# Patient Record
Sex: Male | Born: 1980 | Race: White | Hispanic: No | Marital: Married | Smoking: Former smoker
Health system: Southern US, Community
[De-identification: ages and names within clinical notes are randomized; demographics above are authoritative.]

## PROBLEM LIST (undated history)

## (undated) DIAGNOSIS — F909 Attention-deficit hyperactivity disorder, unspecified type: Secondary | ICD-10-CM

## (undated) DIAGNOSIS — K219 Gastro-esophageal reflux disease without esophagitis: Secondary | ICD-10-CM

## (undated) DIAGNOSIS — F819 Developmental disorder of scholastic skills, unspecified: Secondary | ICD-10-CM

## (undated) DIAGNOSIS — E785 Hyperlipidemia, unspecified: Secondary | ICD-10-CM

## (undated) DIAGNOSIS — C6290 Malignant neoplasm of unspecified testis, unspecified whether descended or undescended: Secondary | ICD-10-CM

## (undated) DIAGNOSIS — A630 Anogenital (venereal) warts: Secondary | ICD-10-CM

## (undated) DIAGNOSIS — I10 Essential (primary) hypertension: Secondary | ICD-10-CM

## (undated) HISTORY — DX: Attention-deficit hyperactivity disorder, unspecified type: F90.9

## (undated) HISTORY — DX: Malignant neoplasm of unspecified testis, unspecified whether descended or undescended: C62.90

## (undated) HISTORY — DX: Anogenital (venereal) warts: A63.0

## (undated) HISTORY — PX: VASECTOMY: SHX75

## (undated) HISTORY — DX: Essential (primary) hypertension: I10

## (undated) HISTORY — DX: Hyperlipidemia, unspecified: E78.5

## (undated) HISTORY — DX: Developmental disorder of scholastic skills, unspecified: F81.9

## (undated) HISTORY — DX: Gastro-esophageal reflux disease without esophagitis: K21.9

---
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
## 2002-10-27 ENCOUNTER — Encounter: Payer: Self-pay | Admitting: Emergency Medicine

## 2002-10-27 ENCOUNTER — Emergency Department (HOSPITAL_COMMUNITY): Admission: EM | Admit: 2002-10-27 | Discharge: 2002-10-27 | Payer: Self-pay | Admitting: Emergency Medicine

## 2004-08-07 ENCOUNTER — Ambulatory Visit: Payer: Self-pay | Admitting: Family Medicine

## 2005-05-11 ENCOUNTER — Ambulatory Visit: Payer: Self-pay | Admitting: Family Medicine

## 2005-05-22 ENCOUNTER — Ambulatory Visit: Payer: Self-pay | Admitting: Family Medicine

## 2005-10-23 ENCOUNTER — Emergency Department (HOSPITAL_COMMUNITY): Admission: EM | Admit: 2005-10-23 | Discharge: 2005-10-23 | Payer: Self-pay | Admitting: Family Medicine

## 2006-05-07 ENCOUNTER — Ambulatory Visit: Payer: Self-pay | Admitting: Family Medicine

## 2006-05-24 ENCOUNTER — Ambulatory Visit: Payer: Self-pay | Admitting: Family Medicine

## 2006-09-12 ENCOUNTER — Ambulatory Visit: Payer: Self-pay | Admitting: Family Medicine

## 2006-09-12 LAB — CONVERTED CEMR LAB
ALT: 29 units/L (ref 0–40)
AST: 25 units/L (ref 0–37)
Chol/HDL Ratio, serum: 4.7
Cholesterol: 208 mg/dL (ref 0–200)
HDL: 44.4 mg/dL (ref >39.0)
LDL DIRECT: 141.5 mg/dL
Triglyceride fasting, serum: 249 mg/dL (ref 0–149)
VLDL: 50 mg/dL — ABNORMAL HIGH (ref 0–40)

## 2007-02-17 ENCOUNTER — Ambulatory Visit: Payer: Self-pay | Admitting: Family Medicine

## 2007-05-15 DIAGNOSIS — E785 Hyperlipidemia, unspecified: Secondary | ICD-10-CM

## 2007-05-15 DIAGNOSIS — F8189 Other developmental disorders of scholastic skills: Secondary | ICD-10-CM

## 2007-05-15 DIAGNOSIS — E782 Mixed hyperlipidemia: Secondary | ICD-10-CM | POA: Insufficient documentation

## 2007-05-20 ENCOUNTER — Telehealth: Payer: Self-pay | Admitting: Family Medicine

## 2007-06-02 ENCOUNTER — Telehealth: Payer: Self-pay | Admitting: Family Medicine

## 2007-06-04 ENCOUNTER — Ambulatory Visit: Payer: Self-pay | Admitting: Family Medicine

## 2007-06-04 LAB — CONVERTED CEMR LAB
Bilirubin Urine: NEGATIVE
Blood in Urine, dipstick: NEGATIVE
Glucose, Urine, Semiquant: NEGATIVE
Ketones, urine, test strip: NEGATIVE
Nitrite: NEGATIVE
Protein, U semiquant: NEGATIVE
Specific Gravity, Urine: 1.02
Urobilinogen, UA: 0.2
WBC Urine, dipstick: NEGATIVE
pH: 7

## 2007-06-05 LAB — CONVERTED CEMR LAB
ALT: 39 units/L (ref 0–53)
AST: 29 units/L (ref 0–37)
Albumin: 4.2 g/dL (ref 3.5–5.2)
Alkaline Phosphatase: 39 units/L (ref 39–117)
BUN: 9 mg/dL (ref 6–23)
Basophils Absolute: 0 10*3/uL (ref 0.0–0.1)
Basophils Relative: 0.6 % (ref 0.0–1.0)
Bilirubin, Direct: 0.1 mg/dL (ref 0.0–0.3)
CO2: 30 meq/L (ref 19–32)
Calcium: 9.4 mg/dL (ref 8.4–10.5)
Chloride: 108 meq/L (ref 96–112)
Cholesterol: 216 mg/dL (ref 0–200)
Creatinine, Ser: 1.1 mg/dL (ref 0.4–1.5)
Direct LDL: 136.5 mg/dL
Eosinophils Absolute: 0.2 10*3/uL (ref 0.0–0.6)
Eosinophils Relative: 3.1 % (ref 0.0–5.0)
GFR calc Af Amer: 103 mL/min
GFR calc non Af Amer: 85 mL/min
Glucose, Bld: 84 mg/dL (ref 70–99)
HCT: 44 % (ref 39.0–52.0)
HDL: 38.4 mg/dL — ABNORMAL LOW (ref >39.0)
Hemoglobin: 15.5 g/dL (ref 13.0–17.0)
Lymphocytes Relative: 35.6 % (ref 12.0–46.0)
MCHC: 35.3 g/dL (ref 30.0–36.0)
MCV: 91.2 fL (ref 78.0–100.0)
Monocytes Absolute: 0.8 10*3/uL — ABNORMAL HIGH (ref 0.2–0.7)
Monocytes Relative: 10.5 % (ref 3.0–11.0)
Neutro Abs: 3.8 10*3/uL (ref 1.4–7.7)
Neutrophils Relative %: 50.2 % (ref 43.0–77.0)
Platelets: 327 10*3/uL (ref 150–400)
Potassium: 4.9 meq/L (ref 3.5–5.1)
RBC: 4.82 M/uL (ref 4.22–5.81)
RDW: 12.9 % (ref 11.5–14.6)
Sodium: 144 meq/L (ref 135–145)
TSH: 2.79 microintl units/mL (ref 0.35–5.50)
Total Bilirubin: 0.7 mg/dL (ref 0.3–1.2)
Total CHOL/HDL Ratio: 5.6
Total Protein: 6.7 g/dL (ref 6.0–8.3)
Triglycerides: 227 mg/dL (ref 0–149)
VLDL: 45 mg/dL — ABNORMAL HIGH (ref 0–40)
WBC: 7.5 10*3/uL (ref 4.5–10.5)

## 2007-06-18 ENCOUNTER — Ambulatory Visit: Payer: Self-pay | Admitting: Family Medicine

## 2007-06-18 DIAGNOSIS — Z8547 Personal history of malignant neoplasm of testis: Secondary | ICD-10-CM

## 2007-08-18 ENCOUNTER — Telehealth: Payer: Self-pay | Admitting: Family Medicine

## 2007-11-18 ENCOUNTER — Telehealth: Payer: Self-pay | Admitting: Family Medicine

## 2008-02-18 ENCOUNTER — Telehealth: Payer: Self-pay | Admitting: Family Medicine

## 2008-05-17 ENCOUNTER — Telehealth: Payer: Self-pay | Admitting: Family Medicine

## 2008-06-25 ENCOUNTER — Telehealth: Payer: Self-pay | Admitting: Internal Medicine

## 2008-07-06 ENCOUNTER — Ambulatory Visit: Payer: Self-pay | Admitting: Family Medicine

## 2008-07-06 LAB — CONVERTED CEMR LAB
Bilirubin Urine: NEGATIVE
Blood in Urine, dipstick: NEGATIVE
Glucose, Urine, Semiquant: NEGATIVE
Ketones, urine, test strip: NEGATIVE
Nitrite: NEGATIVE
Protein, U semiquant: NEGATIVE
Specific Gravity, Urine: 1.025
Urobilinogen, UA: 0.2
WBC Urine, dipstick: NEGATIVE
pH: 5

## 2008-07-08 LAB — CONVERTED CEMR LAB
ALT: 34 units/L (ref 0–53)
AST: 27 units/L (ref 0–37)
Albumin: 4.3 g/dL (ref 3.5–5.2)
Alkaline Phosphatase: 38 units/L — ABNORMAL LOW (ref 39–117)
BUN: 13 mg/dL (ref 6–23)
Basophils Absolute: 0 10*3/uL (ref 0.0–0.1)
Basophils Relative: 0.5 % (ref 0.0–3.0)
Bilirubin, Direct: 0.1 mg/dL (ref 0.0–0.3)
CO2: 28 meq/L (ref 19–32)
Calcium: 9.4 mg/dL (ref 8.4–10.5)
Chloride: 103 meq/L (ref 96–112)
Cholesterol: 252 mg/dL (ref 0–200)
Creatinine, Ser: 1.1 mg/dL (ref 0.4–1.5)
Direct LDL: 110.4 mg/dL
Eosinophils Absolute: 0.2 10*3/uL (ref 0.0–0.7)
Eosinophils Relative: 3 % (ref 0.0–5.0)
GFR calc Af Amer: 102 mL/min
GFR calc non Af Amer: 84 mL/min
Glucose, Bld: 89 mg/dL (ref 70–99)
HCT: 43.6 % (ref 39.0–52.0)
HDL: 33.5 mg/dL — ABNORMAL LOW (ref >39.0)
Hemoglobin: 15.5 g/dL (ref 13.0–17.0)
Lymphocytes Relative: 36.9 % (ref 12.0–46.0)
MCHC: 35.6 g/dL (ref 30.0–36.0)
MCV: 92.5 fL (ref 78.0–100.0)
Monocytes Absolute: 0.7 10*3/uL (ref 0.1–1.0)
Monocytes Relative: 10.2 % (ref 3.0–12.0)
Neutro Abs: 3.6 10*3/uL (ref 1.4–7.7)
Neutrophils Relative %: 49.4 % (ref 43.0–77.0)
Platelets: 283 10*3/uL (ref 150–400)
Potassium: 4 meq/L (ref 3.5–5.1)
RBC: 4.72 M/uL (ref 4.22–5.81)
RDW: 13.1 % (ref 11.5–14.6)
Sodium: 140 meq/L (ref 135–145)
TSH: 4.92 microintl units/mL (ref 0.35–5.50)
Total Bilirubin: 0.8 mg/dL (ref 0.3–1.2)
Total CHOL/HDL Ratio: 7.5
Total Protein: 7.2 g/dL (ref 6.0–8.3)
Triglycerides: 681 mg/dL (ref 0–149)
VLDL: 136 mg/dL — ABNORMAL HIGH (ref 0–40)
WBC: 7.1 10*3/uL (ref 4.5–10.5)

## 2008-07-09 ENCOUNTER — Telehealth: Payer: Self-pay | Admitting: Family Medicine

## 2008-07-19 ENCOUNTER — Telehealth: Payer: Self-pay | Admitting: Family Medicine

## 2008-07-27 ENCOUNTER — Ambulatory Visit: Payer: Self-pay | Admitting: Family Medicine

## 2008-07-27 DIAGNOSIS — R61 Generalized hyperhidrosis: Secondary | ICD-10-CM | POA: Insufficient documentation

## 2008-09-22 ENCOUNTER — Encounter: Payer: Self-pay | Admitting: Family Medicine

## 2008-11-09 ENCOUNTER — Telehealth: Payer: Self-pay | Admitting: Family Medicine

## 2009-02-07 ENCOUNTER — Telehealth: Payer: Self-pay | Admitting: Family Medicine

## 2009-05-06 ENCOUNTER — Telehealth: Payer: Self-pay | Admitting: Family Medicine

## 2009-07-25 ENCOUNTER — Ambulatory Visit: Payer: Self-pay | Admitting: Family Medicine

## 2009-07-25 LAB — CONVERTED CEMR LAB
Bilirubin Urine: NEGATIVE
Blood in Urine, dipstick: NEGATIVE
Glucose, Urine, Semiquant: NEGATIVE
Ketones, urine, test strip: NEGATIVE
Nitrite: NEGATIVE
Protein, U semiquant: NEGATIVE
Specific Gravity, Urine: 1.02
Urobilinogen, UA: 0.2
WBC Urine, dipstick: NEGATIVE
pH: 7

## 2009-07-27 LAB — CONVERTED CEMR LAB
ALT: 37 units/L (ref 0–53)
AST: 26 units/L (ref 0–37)
Albumin: 4.4 g/dL (ref 3.5–5.2)
Alkaline Phosphatase: 32 units/L — ABNORMAL LOW (ref 39–117)
BUN: 10 mg/dL (ref 6–23)
Basophils Relative: 0.7 % (ref 0.0–3.0)
Bilirubin, Direct: 0.1 mg/dL (ref 0.0–0.3)
CO2: 28 meq/L (ref 19–32)
Calcium: 9.3 mg/dL (ref 8.4–10.5)
Chloride: 104 meq/L (ref 96–112)
Cholesterol: 227 mg/dL — ABNORMAL HIGH (ref 0–200)
Creatinine, Ser: 1 mg/dL (ref 0.4–1.5)
Direct LDL: 160.2 mg/dL
Eosinophils Relative: 2.6 % (ref 0.0–5.0)
GFR calc non Af Amer: 92.78 mL/min (ref >60)
Glucose, Bld: 90 mg/dL (ref 70–99)
HCT: 40.5 % (ref 39.0–52.0)
HDL: 40.2 mg/dL (ref >39.00)
Hemoglobin: 14.2 g/dL (ref 13.0–17.0)
Lymphocytes Relative: 30.4 % (ref 12.0–46.0)
MCHC: 35.2 g/dL (ref 30.0–36.0)
MCV: 96.2 fL (ref 78.0–100.0)
Monocytes Relative: 8.8 % (ref 3.0–12.0)
Neutrophils Relative %: 57.5 % (ref 43.0–77.0)
Platelets: 236 10*3/uL (ref 150.0–400.0)
Potassium: 4.4 meq/L (ref 3.5–5.1)
RBC: 4.21 M/uL — ABNORMAL LOW (ref 4.22–5.81)
RDW: 12.9 % (ref 11.5–14.6)
Sodium: 143 meq/L (ref 135–145)
TSH: 3.46 microintl units/mL (ref 0.35–5.50)
Total Bilirubin: 0.8 mg/dL (ref 0.3–1.2)
Total CHOL/HDL Ratio: 6
Total Protein: 7.2 g/dL (ref 6.0–8.3)
Triglycerides: 256 mg/dL — ABNORMAL HIGH (ref 0.0–149.0)
VLDL: 51.2 mg/dL — ABNORMAL HIGH (ref 0.0–40.0)
WBC: 8.2 10*3/uL (ref 4.5–10.5)

## 2009-08-01 ENCOUNTER — Ambulatory Visit: Payer: Self-pay | Admitting: Family Medicine

## 2009-11-01 ENCOUNTER — Ambulatory Visit: Payer: Self-pay | Admitting: Family Medicine

## 2009-11-02 ENCOUNTER — Telehealth: Payer: Self-pay | Admitting: Family Medicine

## 2009-11-02 LAB — CONVERTED CEMR LAB
ALT: 32 units/L (ref 0–53)
AST: 24 units/L (ref 0–37)
Albumin: 4.4 g/dL (ref 3.5–5.2)
Alkaline Phosphatase: 30 units/L — ABNORMAL LOW (ref 39–117)
Bilirubin, Direct: 0 mg/dL (ref 0.0–0.3)
Cholesterol: 163 mg/dL (ref 0–200)
Direct LDL: 52.7 mg/dL
HDL: 35.3 mg/dL — ABNORMAL LOW (ref >39.00)
Total Bilirubin: 0.2 mg/dL — ABNORMAL LOW (ref 0.3–1.2)
Total CHOL/HDL Ratio: 5
Total Protein: 6.7 g/dL (ref 6.0–8.3)
Triglycerides: 784 mg/dL — ABNORMAL HIGH (ref 0.0–149.0)
VLDL: 156.8 mg/dL — ABNORMAL HIGH (ref 0.0–40.0)

## 2009-12-14 ENCOUNTER — Telehealth: Payer: Self-pay | Admitting: Family Medicine

## 2009-12-16 ENCOUNTER — Ambulatory Visit: Payer: Self-pay | Admitting: Family Medicine

## 2009-12-16 DIAGNOSIS — G4700 Insomnia, unspecified: Secondary | ICD-10-CM | POA: Insufficient documentation

## 2010-01-27 ENCOUNTER — Telehealth: Payer: Self-pay | Admitting: Family Medicine

## 2010-02-23 ENCOUNTER — Ambulatory Visit: Payer: Self-pay | Admitting: Internal Medicine

## 2010-02-23 DIAGNOSIS — L03119 Cellulitis of unspecified part of limb: Secondary | ICD-10-CM

## 2010-02-23 DIAGNOSIS — L02419 Cutaneous abscess of limb, unspecified: Secondary | ICD-10-CM | POA: Insufficient documentation

## 2010-04-25 ENCOUNTER — Telehealth: Payer: Self-pay | Admitting: Family Medicine

## 2010-05-01 ENCOUNTER — Telehealth: Payer: Self-pay | Admitting: Family Medicine

## 2010-07-03 ENCOUNTER — Telehealth: Payer: Self-pay | Admitting: Family Medicine

## 2010-07-28 ENCOUNTER — Ambulatory Visit: Payer: Self-pay | Admitting: Internal Medicine

## 2010-07-28 LAB — CONVERTED CEMR LAB
ALT: 39 units/L (ref 0–53)
AST: 29 units/L (ref 0–37)
Albumin: 4.4 g/dL (ref 3.5–5.2)
Alkaline Phosphatase: 26 units/L — ABNORMAL LOW (ref 39–117)
BUN: 16 mg/dL (ref 6–23)
Basophils Absolute: 0.1 10*3/uL (ref 0.0–0.1)
Basophils Relative: 0.8 % (ref 0.0–3.0)
Bilirubin Urine: NEGATIVE
Bilirubin, Direct: 0.1 mg/dL (ref 0.0–0.3)
CO2: 27 meq/L (ref 19–32)
Calcium: 10 mg/dL (ref 8.4–10.5)
Chloride: 106 meq/L (ref 96–112)
Cholesterol: 205 mg/dL — ABNORMAL HIGH (ref 0–200)
Creatinine, Ser: 1.2 mg/dL (ref 0.4–1.5)
Direct LDL: 107.3 mg/dL
Eosinophils Absolute: 0.2 10*3/uL (ref 0.0–0.7)
Eosinophils Relative: 2.4 % (ref 0.0–5.0)
GFR calc non Af Amer: 77.66 mL/min (ref >60)
Glucose, Bld: 103 mg/dL — ABNORMAL HIGH (ref 70–99)
HCT: 43 % (ref 39.0–52.0)
HDL: 36.1 mg/dL — ABNORMAL LOW (ref >39.00)
Hemoglobin, Urine: NEGATIVE
Hemoglobin: 14.8 g/dL (ref 13.0–17.0)
Ketones, ur: NEGATIVE mg/dL
Leukocytes, UA: NEGATIVE
Lymphocytes Relative: 32.1 % (ref 12.0–46.0)
Lymphs Abs: 2.1 10*3/uL (ref 0.7–4.0)
MCHC: 34.4 g/dL (ref 30.0–36.0)
MCV: 94 fL (ref 78.0–100.0)
Monocytes Absolute: 0.6 10*3/uL (ref 0.1–1.0)
Monocytes Relative: 9.7 % (ref 3.0–12.0)
Neutro Abs: 3.5 10*3/uL (ref 1.4–7.7)
Neutrophils Relative %: 55 % (ref 43.0–77.0)
Nitrite: NEGATIVE
Platelets: 276 10*3/uL (ref 150.0–400.0)
Potassium: 4.6 meq/L (ref 3.5–5.1)
RBC: 4.57 M/uL (ref 4.22–5.81)
RDW: 13 % (ref 11.5–14.6)
Sodium: 141 meq/L (ref 135–145)
Specific Gravity, Urine: 1.015 (ref 1.000–1.030)
TSH: 3.31 microintl units/mL (ref 0.35–5.50)
Total Bilirubin: 0.6 mg/dL (ref 0.3–1.2)
Total CHOL/HDL Ratio: 6
Total Protein, Urine: NEGATIVE mg/dL
Total Protein: 6.8 g/dL (ref 6.0–8.3)
Triglycerides: 501 mg/dL — ABNORMAL HIGH (ref 0.0–149.0)
Urine Glucose: NEGATIVE mg/dL
Urobilinogen, UA: 0.2 (ref 0.0–1.0)
VLDL: 100.2 mg/dL — ABNORMAL HIGH (ref 0.0–40.0)
WBC: 6.4 10*3/uL (ref 4.5–10.5)
pH: 6 (ref 5.0–8.0)

## 2010-08-04 ENCOUNTER — Ambulatory Visit: Payer: Self-pay | Admitting: Family Medicine

## 2010-08-08 ENCOUNTER — Telehealth: Payer: Self-pay | Admitting: Family Medicine

## 2010-10-17 NOTE — Assessment & Plan Note (Signed)
Summary: cpx/njr/pt rescd to see dr fry//ccm   Vital Signs:  Patient profile:   31 year old male Height:      68 inches Weight:      183 pounds BMI:     27.93 O2 Sat:      98 % Temp:     97.3 degrees F Pulse rate:   88 / minute BP sitting:   120 / 94  (left arm) Cuff size:   large  Vitals Entered By: Pura Spice, RN (August 04, 2010 2:56 PM) CC: cpx    History of Present Illness: 30 yr old male for a cpx. he feels fine in general. He has put on some weight over the past 6 months, and he realizes this is due to getting little exercise and eating too much fast food. This is reflected in his elevated BP today and in his high TG level at 500. We have switched hm from immediate release Adderall to XR once a day, and this works well for him in the mornings. However it runs out early in the afternoon before he can finish his work day.   Allergies (verified): No Known Drug Allergies  Past History:  Past Medical History: Reviewed history from 07/27/2008 and no changes required. Hyperlipidemia learning disability genital warts testicular cancer 1997 ADHD  Past Surgical History: Reviewed history from 07/27/2008 and no changes required. left orchiectomy 1997  Family History: Reviewed history from 06/18/2007 and no changes required. Family History High cholesterol Family History Hypertension  Social History: Reviewed history from 06/18/2007 and no changes required. Single Current Smoker Alcohol use-yes Occupation: IT specialist  Occupation:  employed  Review of Systems  The patient denies anorexia, fever, weight loss, vision loss, decreased hearing, hoarseness, chest pain, syncope, dyspnea on exertion, peripheral edema, prolonged cough, headaches, hemoptysis, abdominal pain, melena, hematochezia, severe indigestion/heartburn, hematuria, incontinence, genital sores, muscle weakness, suspicious skin lesions, transient blindness, difficulty walking, depression, unusual  weight change, abnormal bleeding, enlarged lymph nodes, angioedema, breast masses, and testicular masses.         Flu Vaccine Consent Questions     Do you have a history of severe allergic reactions to this vaccine? no    Any prior history of allergic reactions to egg and/or gelatin? no    Do you have a sensitivity to the preservative Thimersol? no    Do you have a past history of Guillan-Barre Syndrome? no    Do you currently have an acute febrile illness? no    Have you ever had a severe reaction to latex? no    Vaccine information given and explained to patient? yes    Are you currently pregnant? no    Lot Number:[id number]   Exp Date:03/16/2010   Site Given  Left Deltoid IM Pura Spice, RN  August 04, 2010 2:57 PM   Physical Exam  General:  Well-developed,well-nourished,in no acute distress; alert,appropriate and cooperative throughout examination Head:  Normocephalic and atraumatic without obvious abnormalities. No apparent alopecia or balding. Eyes:  No corneal or conjunctival inflammation noted. EOMI. Perrla. Funduscopic exam benign, without hemorrhages, exudates or papilledema. Vision grossly normal. Ears:  External ear exam shows no significant lesions or deformities.  Otoscopic examination reveals clear canals, tympanic membranes are intact bilaterally without bulging, retraction, inflammation or discharge. Hearing is grossly normal bilaterally. Nose:  External nasal examination shows no deformity or inflammation. Nasal mucosa are pink and moist without lesions or exudates. Mouth:  Oral mucosa and oropharynx without lesions or exudates.  Teeth in good repair. Neck:  No deformities, masses, or tenderness noted. Chest Wall:  No deformities, masses, tenderness or gynecomastia noted. Lungs:  Normal respiratory effort, chest expands symmetrically. Lungs are clear to auscultation, no crackles or wheezes. Heart:  Normal rate and regular rhythm. S1 and S2 normal without gallop,  murmur, click, rub or other extra sounds. Abdomen:  Bowel sounds positive,abdomen soft and non-tender without masses, organomegaly or hernias noted. Genitalia:  right testicle  descended without nodularity, tenderness or masses. No scrotal masses or lesions. No penis lesions or urethral discharge. Left testicle is surgically absent  Msk:  No deformity or scoliosis noted of thoracic or lumbar spine.   Pulses:  R and L carotid,radial,femoral,dorsalis pedis and posterior tibial pulses are full and equal bilaterally Extremities:  No clubbing, cyanosis, edema, or deformity noted with normal full range of motion of all joints.   Neurologic:  No cranial nerve deficits noted. Station and gait are normal. Plantar reflexes are down-going bilaterally. DTRs are symmetrical throughout. Sensory, motor and coordinative functions appear intact. Skin:  Intact without suspicious lesions or rashes Cervical Nodes:  No lymphadenopathy noted Axillary Nodes:  No palpable lymphadenopathy Inguinal Nodes:  No significant adenopathy Psych:  Cognition and judgment appear intact. Alert and cooperative with normal attention span and concentration. No apparent delusions, illusions, hallucinations   Impression & Recommendations:  Problem # 1:  EXAMINATION, ROUTINE MEDICAL (ICD-V70.0)  Complete Medication List: 1)  Simvastatin 40 Mg Tabs (Simvastatin) .... Take one tablet at bedtime 2)  Fenofibrate 160 Mg Tabs (Fenofibrate) .... Once daily 3)  Adderall Xr 30 Mg Xr24h-cap (Amphetamine-dextroamphetamine) .... Once daily, may fill on 10-04-10 4)  Niaspan 1000 Mg Cr-tabs (Niacin (antihyperlipidemic)) .... Once daily  Other Orders: Admin 1st Vaccine (40981) Flu Vaccine 13yrs + (19147)  Patient Instructions: 1)  Try Adderall XR 30 mg once daily . Add Niaspan CR to his regimen.  2)  It is important that you exercise reguarly at least 20 minutes 5 times a week. If you develop chest pain, have severe difficulty breathing, or feel  very tired, stop exercising immediately and seek medical attention.  3)  You need to lose weight. Consider a lower calorie diet and regular exercise.  Prescriptions: ADDERALL XR 30 MG XR24H-CAP (AMPHETAMINE-DEXTROAMPHETAMINE) once daily, may fill on 10-04-10  #30 x 0   Entered and Authorized by:   Nelwyn Salisbury MD   Signed by:   Nelwyn Salisbury MD on 08/04/2010   Method used:   Print then Give to Patient   RxID:   [id number] ADDERALL XR 30 MG XR24H-CAP (AMPHETAMINE-DEXTROAMPHETAMINE) once daily, may fill on 09-03-10  #30 x 0   Entered and Authorized by:   Nelwyn Salisbury MD   Signed by:   Nelwyn Salisbury MD on 08/04/2010   Method used:   Print then Give to Patient   RxID:   [id number] NIASPAN 1000 MG CR-TABS (NIACIN (ANTIHYPERLIPIDEMIC)) once daily  #30 x 11   Entered and Authorized by:   Nelwyn Salisbury MD   Signed by:   Nelwyn Salisbury MD on 08/04/2010   Method used:   Print then Give to Patient   RxID:   [id number] ADDERALL XR 30 MG XR24H-CAP (AMPHETAMINE-DEXTROAMPHETAMINE) once daily  #30 x 0   Entered and Authorized by:   Nelwyn Salisbury MD   Signed by:   Nelwyn Salisbury MD on 08/04/2010   Method used:   Print then Give to Patient   RxID:  [id number] FENOFIBRATE 160 MG TABS (FENOFIBRATE) once daily  #30 x 11   Entered and Authorized by:   Nelwyn Salisbury MD   Signed by:   Nelwyn Salisbury MD on 08/04/2010   Method used:   Print then Give to Patient   RxID:   [id number] SIMVASTATIN 40 MG TABS (SIMVASTATIN) Take one tablet at bedtime  #30 x 11   Entered and Authorized by:   Nelwyn Salisbury MD   Signed by:   Nelwyn Salisbury MD on 08/04/2010   Method used:   Print then Give to Patient   RxID:   [id number]    Orders Added: 1)  Admin 1st Vaccine [90471] 2)  Flu Vaccine 102yrs + [53664] 3)  Est. Patient 18-39 years [40347]   Immunization History:  Tetanus/Td Immunization History:    Tetanus/Td:  historical (09/17/2004)   Immunization History:  Tetanus/Td  Immunization History:    Tetanus/Td:  Historical (09/17/2004)

## 2010-10-17 NOTE — Progress Notes (Signed)
Summary: Rx for sleep  Phone Note Call from Patient Call back at Work Phone [phone]   Caller: Patient Call For: Nelwyn Salisbury MD Summary of Call: getting married 4/30- having trouble sleeping. Wants a "minor" sleep aid till this is over. Initial call taken by: Raechel Ache, RN,  December 14, 2009 10:01 AM  Follow-up for Phone Call        he needs an OV to discuss this Follow-up by: Nelwyn Salisbury MD,  December 14, 2009 1:31 PM  Additional Follow-up for Phone Call Additional follow up Details #1::        Phone Call Completed Additional Follow-up by: Raechel Ache, RN,  December 14, 2009 2:44 PM

## 2010-10-17 NOTE — Progress Notes (Signed)
Summary: 90 day rx  Phone Note Call from Patient Call back at Work Phone [phone]   Caller: Patient Call For: Nelwyn Salisbury MD Summary of Call: PT WOULD LIKE TO KNOW SHOULD HE [facility] LIPID  PANELTEST IN 6 MONTHS ? ALSO TO SAVE MONEY PT WOULD LIKE A 90 DAY RXS FENOFIBRATE 160MG  AND SIMVASTATIN 40 MG Initial call taken by: Heron Sabins,  August 08, 2010 12:54 PM  Follow-up for Phone Call        done Follow-up by: Nelwyn Salisbury MD,  August 08, 2010 3:21 PM  Additional Follow-up for Phone Call Additional follow up Details #1::        notified pt and he wants simivastatin for 80mg  to break in half so "it will be cheaper and more pills"  informed  pt Dr Clent Ridges could only write for the mg tablets he uses and some meds are not made to 1/2  Pt stated well just throw those Rx's away  Dr Clent Ridges aware.  Additional Follow-up by: Pura Spice, RN,  August 08, 2010 3:55 PM    Prescriptions: FENOFIBRATE 160 MG TABS (FENOFIBRATE) once daily  #90 x 3   Entered and Authorized by:   Nelwyn Salisbury MD   Signed by:   Nelwyn Salisbury MD on 08/08/2010   Method used:   Print then Give to Patient   RxID:   [id number] SIMVASTATIN 40 MG TABS (SIMVASTATIN) Take one tablet at bedtime  #90 x 3   Entered and Authorized by:   Nelwyn Salisbury MD   Signed by:   Nelwyn Salisbury MD on 08/08/2010   Method used:   Print then Give to Patient   RxID:   [id number]

## 2010-10-17 NOTE — Progress Notes (Signed)
Summary: REFILL REQUEST (Switch to Adderall XR?)  Phone Note Call from Patient   Caller: Patient   [phone] Summary of Call: Pt called to adv that he has went to several pharmacies in the area, as well as calling around to several pharmacies in the area and he is being advised that there is a shortage of Adderall and his Rx cannot be filled due to same.... Pt was advised that there is a manufacturer backorder on the med and the other option he has is to switch to Adderall XR...... Pt would like to have an Rx for Adderall XR...... Pt can be reached at [phone] with any questions or concerns / to adv if ready.  Initial call taken by: Debbra Riding,  July 03, 2010 8:43 AM  Follow-up for Phone Call        try XR for a month Follow-up by: Nelwyn Salisbury MD,  July 03, 2010 9:32 AM  Additional Follow-up for Phone Call Additional follow up Details #1::        called. Additional Follow-up by: Pura Spice, RN,  July 03, 2010 10:02 AM    New/Updated Medications: ADDERALL XR 20 MG XR24H-CAP (AMPHETAMINE-DEXTROAMPHETAMINE) once daily Prescriptions: ADDERALL XR 20 MG XR24H-CAP (AMPHETAMINE-DEXTROAMPHETAMINE) once daily  #30 x 0   Entered and Authorized by:   Nelwyn Salisbury MD   Signed by:   Nelwyn Salisbury MD on 07/03/2010   Method used:   Print then Give to Patient   RxID:   [id number]

## 2010-10-17 NOTE — Assessment & Plan Note (Signed)
Summary: ?bug bite/njr   Vital Signs:  Patient profile:   30 year old male Weight:      175 pounds Temp:     98.4 degrees F oral BP sitting:   130 / 80  (right arm) Cuff size:   regular  Vitals Entered By: Duard Brady LPN (February 23, 1609 3:48 PM) CC: c/o (R) inner lower leg ??bug bites  x 2 wks Is Patient Diabetic? No   CC:  c/o (R) inner lower leg ??bug bites  x 2 wks.  History of Present Illness: 30 year old patient with a two-week history of skin lesions involving his right lower medial leg.  He feels this began as some bug bites and two of these lesions have worsened in spite of local skin therapy.  Denies any allergies to antibiotics.  No fever or other systemic complaints  Allergies (verified): No Known Drug Allergies  Review of Systems       The patient complains of suspicious skin lesions.  The patient denies anorexia, fever, weight loss, weight gain, vision loss, decreased hearing, hoarseness, chest pain, syncope, dyspnea on exertion, peripheral edema, prolonged cough, headaches, hemoptysis, abdominal pain, melena, hematochezia, severe indigestion/heartburn, hematuria, incontinence, genital sores, muscle weakness, transient blindness, difficulty walking, depression, unusual weight change, abnormal bleeding, enlarged lymph nodes, angioedema, breast masses, and testicular masses.    Physical Exam  General:  Well-developed,well-nourished,in no acute distress; alert,appropriate and cooperative throughout examination Skin:  two shallow ulcers two to 3 mm in diameter, involving the right medial lower leg.  One area had a surrounding area of erythema of approximately  2 cm.  No fluctuance   Impression & Recommendations:  Problem # 1:  CELLULITIS, RIGHT LEG (ICD-682.6)  His updated medication list for this problem includes:    Cephalexin 500 Mg Caps (Cephalexin) ..... One twice daily  Orders: Prescription Created Electronically [id number]  Complete Medication List: 1)   Adderall 20 Mg Tabs (Amphetamine-dextroamphetamine) .... Two times a day, may fill on 03-29-10 2)  Simvastatin 40 Mg Tabs (Simvastatin) .... Take one tablet at bedtime 3)  Fenofibrate 160 Mg Tabs (Fenofibrate) .... Once daily 4)  Temazepam 30 Mg Caps (Temazepam) .... At bedtime 5)  Cephalexin 500 Mg Caps (Cephalexin) .... One twice daily  Patient Instructions: 1)  Take your antibiotic as prescribed until ALL of it is gone, but stop if you develop a rash or swelling and contact our office as soon as possible. 2)  Please schedule a follow-up appointment as needed. Prescriptions: CEPHALEXIN 500 MG CAPS (CEPHALEXIN) one twice daily  #14 x 0   Entered and Authorized by:   Gordy Savers  MD   Signed by:   Gordy Savers  MD on 02/23/2010   Method used:   Print then Give to Patient   RxID:   [id number] CEPHALEXIN 500 MG CAPS (CEPHALEXIN) one twice daily  #14 x 0   Entered and Authorized by:   Gordy Savers  MD   Signed by:   Gordy Savers  MD on 02/23/2010   Method used:   Electronically to        [facility] [address] (retail)       [address]       [place], [place]  [address]       Ph: [phone] or [phone]       Fax: [phone]   RxID:   [id number]

## 2010-10-17 NOTE — Progress Notes (Signed)
Summary: FYI  Phone Note Call from Patient   Caller: Patient Call For: Nelwyn Salisbury MD Summary of Call: Pt wants Dr. Clent Ridges know he has stopped the Temazepam. Initial call taken by: Lynann Beaver CMA,  April 25, 2010 12:22 PM  Follow-up for Phone Call        noted Follow-up by: Nelwyn Salisbury MD,  April 25, 2010 1:04 PM

## 2010-10-17 NOTE — Progress Notes (Signed)
Summary: REFILL REQUEST (ADDERALL)  Phone Note Refill Request Message from:  Patient on Jan 27, 2010 10:02 AM  Refills Requested: Medication #1:  ADDERALL 20 MG TABS two times a day   Notes: Pt can be reached at [phone] when Rx is ready for p/u.    Initial call taken by: Debbra Riding,  Jan 27, 2010 10:02 AM  Follow-up for Phone Call        attempt to called - ans mach - LMTCB if questions - rx's ready for pick up Monday morning. KIK Follow-up by: Duard Brady LPN,  Jan 27, 2010 5:21 PM    New/Updated Medications: ADDERALL 20 MG TABS (AMPHETAMINE-DEXTROAMPHETAMINE) two times a day ADDERALL 20 MG TABS (AMPHETAMINE-DEXTROAMPHETAMINE) two times a day, may fill on 02-27-10 ADDERALL 20 MG TABS (AMPHETAMINE-DEXTROAMPHETAMINE) two times a day, may fill on 03-29-10 Prescriptions: ADDERALL 20 MG TABS (AMPHETAMINE-DEXTROAMPHETAMINE) two times a day, may fill on 03-29-10  #60 x 0   Entered and Authorized by:   Nelwyn Salisbury MD   Signed by:   Nelwyn Salisbury MD on 01/27/2010   Method used:   Print then Give to Patient   RxID:   [id number] ADDERALL 20 MG TABS (AMPHETAMINE-DEXTROAMPHETAMINE) two times a day, may fill on 02-27-10  #60 x 0   Entered and Authorized by:   Nelwyn Salisbury MD   Signed by:   Nelwyn Salisbury MD on 01/27/2010   Method used:   Print then Give to Patient   RxID:   [id number] ADDERALL 20 MG TABS (AMPHETAMINE-DEXTROAMPHETAMINE) two times a day  #60 x 0   Entered and Authorized by:   Nelwyn Salisbury MD   Signed by:   Nelwyn Salisbury MD on 01/27/2010   Method used:   Print then Give to Patient   RxID:   [id number]

## 2010-10-17 NOTE — Progress Notes (Signed)
Summary: Pt req 3 month scipts for Adderall 20mg   Phone Note Refill Request Call back at [phone] cell Message from:  Patient on May 01, 2010 9:20 AM  Refills Requested: Medication #1:  ADDERALL 20 MG TABS two times a day   Dosage confirmed as above?Dosage Confirmed   Supply Requested: 3 months  Method Requested: Pick up at Office Initial call taken by: Lucy Antigua,  May 01, 2010 9:19 AM  Follow-up for Phone Call        done Follow-up by: Nelwyn Salisbury MD,  May 01, 2010 3:29 PM    New/Updated Medications: ADDERALL 20 MG TABS (AMPHETAMINE-DEXTROAMPHETAMINE) two times a day ADDERALL 20 MG TABS (AMPHETAMINE-DEXTROAMPHETAMINE) two times a day, may fill on 06-01-10 ADDERALL 20 MG TABS (AMPHETAMINE-DEXTROAMPHETAMINE) two times a day, may fill on 07-01-10 Prescriptions: ADDERALL 20 MG TABS (AMPHETAMINE-DEXTROAMPHETAMINE) two times a day, may fill on 07-01-10  #60 x 0   Entered and Authorized by:   Nelwyn Salisbury MD   Signed by:   Nelwyn Salisbury MD on 05/01/2010   Method used:   Print then Give to Patient   RxID:   [id number] ADDERALL 20 MG TABS (AMPHETAMINE-DEXTROAMPHETAMINE) two times a day, may fill on 06-01-10  #60 x 0   Entered and Authorized by:   Nelwyn Salisbury MD   Signed by:   Nelwyn Salisbury MD on 05/01/2010   Method used:   Print then Give to Patient   RxID:   [id number] ADDERALL 20 MG TABS (AMPHETAMINE-DEXTROAMPHETAMINE) two times a day  #60 x 0   Entered and Authorized by:   Nelwyn Salisbury MD   Signed by:   Nelwyn Salisbury MD on 05/01/2010   Method used:   Print then Give to Patient   RxID:   [id number]

## 2010-10-17 NOTE — Progress Notes (Signed)
Summary: refill  Phone Note Call from Patient Call back at Work Phone [phone]   Caller: Patient---live call Reason for Call: Refill Medication Summary of Call: Refill Adderall. Initial call taken by: Warnell Forester,  November 02, 2009 9:05 AM  Follow-up for Phone Call        done Follow-up by: Nelwyn Salisbury MD,  November 02, 2009 12:50 PM  Additional Follow-up for Phone Call Additional follow up Details #1::        rx up front ready for p/u, pt aware Additional Follow-up by: Alfred Levins, CMA,  November 02, 2009 2:00 PM    New/Updated Medications: ADDERALL 20 MG TABS (AMPHETAMINE-DEXTROAMPHETAMINE) two times a day ADDERALL 20 MG TABS (AMPHETAMINE-DEXTROAMPHETAMINE) two times a day, may fill on 11-30-09 ADDERALL 20 MG TABS (AMPHETAMINE-DEXTROAMPHETAMINE) two times a day, may fill on 12-31-09 Prescriptions: ADDERALL 20 MG TABS (AMPHETAMINE-DEXTROAMPHETAMINE) two times a day, may fill on 12-31-09  #60 x 0   Entered and Authorized by:   Nelwyn Salisbury MD   Signed by:   Nelwyn Salisbury MD on 11/02/2009   Method used:   Print then Give to Patient   RxID:   [id number] ADDERALL 20 MG TABS (AMPHETAMINE-DEXTROAMPHETAMINE) two times a day, may fill on 11-30-09  #60 x 0   Entered and Authorized by:   Nelwyn Salisbury MD   Signed by:   Nelwyn Salisbury MD on 11/02/2009   Method used:   Print then Give to Patient   RxID:   [id number] ADDERALL 20 MG TABS (AMPHETAMINE-DEXTROAMPHETAMINE) two times a day  #60 x 0   Entered and Authorized by:   Nelwyn Salisbury MD   Signed by:   Nelwyn Salisbury MD on 11/02/2009   Method used:   Print then Give to Patient   RxID:   [id number]

## 2010-10-17 NOTE — Assessment & Plan Note (Signed)
Summary: med check/consult re: sleep med/cjr   Vital Signs:  Patient profile:   30 year old male Weight:      174 pounds Temp:     97.5 degrees F 97.5 BP sitting:   122 / 76  Vitals Entered By: Lynann Beaver CMA (December 16, 2009 9:54 AM) CC: insomnia Is Patient Diabetic? No Pain Assessment Patient in pain? no        History of Present Illness: Here to discuss recent problems with sleep. 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 he has trouble falling asleep, and then he wakes up frequently through the night. He has been on his current dose of Adderall for quite some time, and we both agree that this should not be the problem.   Current Medications (verified): 1)  Adderall 20 Mg Tabs (Amphetamine-Dextroamphetamine) .... Two Times A Day, May Fill On 12-31-09 2)  Simvastatin 40 Mg Tabs (Simvastatin) .... Take One Tablet At Bedtime 3)  Fenofibrate 160 Mg Tabs (Fenofibrate) .... Once Daily  Allergies (verified): No Known Drug Allergies  Past History:  Past Medical History: Reviewed history from 07/27/2008 and no changes required. Hyperlipidemia learning disability genital warts testicular cancer 1997 ADHD  Review of Systems  The patient denies anorexia, fever, weight loss, weight gain, vision loss, decreased hearing, hoarseness, chest pain, syncope, dyspnea on exertion, peripheral edema, prolonged cough, headaches, hemoptysis, abdominal pain, melena, hematochezia, severe indigestion/heartburn, hematuria, incontinence, genital sores, muscle weakness, suspicious skin lesions, transient blindness, difficulty walking, depression, unusual weight change, abnormal bleeding, enlarged lymph nodes, angioedema, breast masses, and testicular masses.    Physical Exam  General:  Well-developed,well-nourished,in no acute distress; alert,appropriate and cooperative throughout examination Neurologic:  alert &  oriented X3.   Psych:  Cognition and judgment appear intact. Alert and cooperative with normal attention span and concentration. No apparent delusions, illusions, hallucinations   Impression & Recommendations:  Problem # 1:  INSOMNIA (ICD-780.52)  His updated medication list for this problem includes:    Temazepam 30 Mg Caps (Temazepam) .Marland Kitchen... At bedtime  Complete Medication List: 1)  Adderall 20 Mg Tabs (Amphetamine-dextroamphetamine) .... Two times a day, may fill on 12-31-09 2)  Simvastatin 40 Mg Tabs (Simvastatin) .... Take one tablet at bedtime 3)  Fenofibrate 160 Mg Tabs (Fenofibrate) .... Once daily 4)  Temazepam 30 Mg Caps (Temazepam) .... At bedtime  Patient Instructions: 1)  Please schedule a follow-up appointment as needed .  Prescriptions: TEMAZEPAM 30 MG CAPS (TEMAZEPAM) at bedtime  #30 x 2   Entered and Authorized by:   Nelwyn Salisbury MD   Signed by:   Nelwyn Salisbury MD on 12/16/2009   Method used:   Print then Give to Patient   RxID:   [id number]

## 2010-11-01 ENCOUNTER — Telehealth: Payer: Self-pay | Admitting: Family Medicine

## 2010-11-01 MED ORDER — AMPHETAMINE-DEXTROAMPHET ER 30 MG PO CP24: 30.0000 mg | ORAL_CAPSULE | ORAL | Status: DC

## 2010-11-01 NOTE — Telephone Encounter (Signed)
Done, in your box

## 2010-11-01 NOTE — Telephone Encounter (Signed)
Pt aware ready to pick up 

## 2010-11-01 NOTE — Telephone Encounter (Signed)
Pt called req refill of Adderall XR 30mg . Pls call pt when ready for pick up. Pt said that Dr Clent Ridges mentioned an alternative to Adderall, due to shortage of med at pharmacies. Pt req alternative, if Adderall not avail

## 2010-11-07 ENCOUNTER — Other Ambulatory Visit: Payer: Self-pay

## 2010-11-07 NOTE — Telephone Encounter (Signed)
Pt called to report the "niaspan" he taking cost $70.00 per month and he can't afford it.  Requesting to take Niacin OTC if you approve pls call [phone]  [facility].

## 2010-11-08 NOTE — Telephone Encounter (Signed)
Ok to take OTC instead

## 2010-11-08 NOTE — Telephone Encounter (Signed)
Left mess on his cell phone ok to take otc niacin   requested to call back if questions.

## 2011-01-01 ENCOUNTER — Telehealth: Payer: Self-pay

## 2011-01-01 NOTE — Telephone Encounter (Signed)
Pt called and wanted to know when date was when he stopped smoking which per EMR - unknown  Stated he did do the chantix and was never discussed at any other CPX Pt has an appt for f"life nsurance policy and he stated date  needs to reflect 2009 that he stopped smoking pls call [phone]

## 2011-01-02 NOTE — Telephone Encounter (Signed)
Pt aware ; copy letter mailed to pt.

## 2011-01-02 NOTE — Telephone Encounter (Signed)
I wrote a note documenting that he quit in 2009

## 2011-01-02 NOTE — Telephone Encounter (Signed)
According to our records, he was still smoking as of November 2008. Leave the note as written (quit in 2009).

## 2011-01-02 NOTE — Telephone Encounter (Signed)
Pt returned call would like  date changed to 2008  Says he apologizes cause he told 2009 but on his insurance policy application they put 2008  Ok to [employer]. Call back at [phone] if questions

## 2011-01-29 ENCOUNTER — Telehealth: Payer: Self-pay | Admitting: *Deleted

## 2011-01-29 NOTE — Telephone Encounter (Signed)
Pt needs Adderal, and would like to go back to the plain and not SR.  He wants to know when Dr. Clent Ridges wants him to come back for a lipid check.

## 2011-01-30 MED ORDER — AMPHETAMINE-DEXTROAMPHETAMINE 20 MG PO TABS: 20.0000 mg | ORAL_TABLET | Freq: Two times a day (BID) | ORAL | Status: DC

## 2011-01-30 NOTE — Telephone Encounter (Signed)
Adderall was written for 3 months. It is time now (it has been 6 months) to check blood again. Set up for lipids and liver panel soon for 272.4

## 2011-01-31 NOTE — Telephone Encounter (Addendum)
Pt is aware rx ready for pick up also pt is sch for fasting lab at [facility] on 02-13-2011

## 2011-02-02 NOTE — Assessment & Plan Note (Signed)
[facility]                              [facility] OFFICE NOTE   NAME:Jordan Robles, Jordan Robles                     MRN:          [id number]  DATE:05/24/2006                            DOB:          [birth year]    This is a 30 year old gentleman here for a complete physical examination.  He has a couple of things to discuss.  First off, he would like a  prescription for Chantix to stop smoking.  He has tried other products with  no success.  Also he thinks he has a couple more warts on his penis.  He has  had these treated some years ago.   PAST MEDICAL HISTORY, FAMILY HISTORY, SOCIAL HISTORY,:  Further details  refer to last physical note dated May 22, 2005.   ALLERGIES:  None.   CURRENT MEDICATIONS:  Adderall 20 mg b.i.d.   OBJECTIVE:  VITAL SIGNS:  Height 5 foot 8 inches.  Weight 161.  Blood  pressure 110/80.  Pulse 70 and regular.  GENERAL:  He appears to be healthy.  SKIN:  Free of significant lesions except for two small warts on his penile  shaft.  EYES:  Clear, scleraeclear. Pharynx clear.  NECK:  Supple without lymphadenopathy, masses.  CARDIAC:  Regular rate and rhythm without gallops, murmurs, rubs.  ABDOMEN:  Soft.  Normal bowel sounds.  Non-tender, no masses.  GENITALIA:  Normal male except for the warts above.  EXTREMITIES:  No clubbing, cyanosis, or edema.  NEUROLOGIC:  Exam is grossly intact.   He was here for fasting labs on August 21.  These were all normal except for  his lipid panel.  He has been noted to have abnormal lipids in the past and  does seem to be following a reasonable diet.  His HDL is low at 38.  LDL  high at 112.  VLDL high at 94 and triglycerides quite high at 468.   ASSESSMENT/PLAN:  1. Complete physical exam.  We will do this again in a year.  2. Hyperlipidemia.  Will begin Tricor 145 mg once a day and we will check      a lipid panel again      in three months.  3. Smoking.  Wrote for a three month  course of Chantix.  4. 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                                  Tera Mater. Clent Ridges, MD   SAF/MedQ  DD:  05/27/2006  DT:  05/27/2006  Job #:  [id number]

## 2011-02-13 ENCOUNTER — Other Ambulatory Visit: Payer: Self-pay

## 2011-02-13 ENCOUNTER — Other Ambulatory Visit: Payer: Self-pay | Admitting: Family Medicine

## 2011-02-13 DIAGNOSIS — E785 Hyperlipidemia, unspecified: Secondary | ICD-10-CM

## 2011-02-14 ENCOUNTER — Other Ambulatory Visit (INDEPENDENT_AMBULATORY_CARE_PROVIDER_SITE_OTHER): Payer: 59 | Admitting: Family Medicine

## 2011-02-14 ENCOUNTER — Other Ambulatory Visit (INDEPENDENT_AMBULATORY_CARE_PROVIDER_SITE_OTHER): Payer: 59

## 2011-02-14 DIAGNOSIS — E785 Hyperlipidemia, unspecified: Secondary | ICD-10-CM

## 2011-02-14 DIAGNOSIS — Z13220 Encounter for screening for lipoid disorders: Secondary | ICD-10-CM

## 2011-02-14 LAB — LDL CHOLESTEROL, DIRECT: Direct LDL: 86.4 mg/dL

## 2011-02-14 LAB — LIPID PANEL
Cholesterol: 168 mg/dL (ref 0–200)
HDL: 40.6 mg/dL (ref >39.00)
Total CHOL/HDL Ratio: 4
VLDL: 105.2 mg/dL — ABNORMAL HIGH (ref 0.0–40.0)

## 2011-02-14 LAB — HEPATIC FUNCTION PANEL
Albumin: 4.2 g/dL (ref 3.5–5.2)
Total Protein: 6.8 g/dL (ref 6.0–8.3)

## 2011-02-15 ENCOUNTER — Telehealth: Payer: Self-pay | Admitting: *Deleted

## 2011-02-15 NOTE — Telephone Encounter (Signed)
Message copied by Regis Bill on Thu Feb 15, 2011  1:49 PM ------      Message from: Gershon Crane A      Created: Thu Feb 15, 2011  8:46 AM       His LDL is excellent but the TG remain high. Stay on current meds

## 2011-02-15 NOTE — Telephone Encounter (Signed)
LMOM to inform Pt. 

## 2011-02-20 ENCOUNTER — Telehealth: Payer: Self-pay | Admitting: *Deleted

## 2011-02-20 NOTE — Progress Notes (Signed)
Call placed to patient at [phone], he was advised per Dr Claris Che instructions

## 2011-02-20 NOTE — Telephone Encounter (Signed)
Left message on pt's voice mail with Dr. Claris Che recommendations.

## 2011-02-20 NOTE — Telephone Encounter (Signed)
Call placed to patient to inform of lab results, and he has been advised. He states that he has received his FSA accounts and is wanting to know if Dr Clent Ridges could provide him a Rx or a letter for fish oil and aspirin since is was advised for him to take these over the counter medication, to help with his cholesterol management. He stated that he needs the letter iinorder for the over the counter to be covered by his flexible spending account.

## 2011-02-20 NOTE — Telephone Encounter (Signed)
No we do not provide prescriptions for OTC meds

## 2011-02-23 ENCOUNTER — Telehealth: Payer: Self-pay | Admitting: Family Medicine

## 2011-02-23 NOTE — Telephone Encounter (Signed)
Copy of lab sent to home address

## 2011-02-28 ENCOUNTER — Telehealth: Payer: Self-pay | Admitting: *Deleted

## 2011-02-28 NOTE — Telephone Encounter (Signed)
Spoke to pt- Alk phos keeps getting lower and lower. Pt is wondering if there is any concerned there and if anything needs to be done. I advised pt that md usually addresses this if it's something to worry about but since md is out of the office, I would leave a message for him to get his opinion on this when he comes back next week. Pt is okay with this.

## 2011-02-28 NOTE — Telephone Encounter (Signed)
Pt received lab results and has questions re: alk phos being low.  Please call.

## 2011-03-05 NOTE — Telephone Encounter (Signed)
This is nothing to worry about. We only get concerned about alkaline phosphatase levels that are too high

## 2011-03-06 NOTE — Telephone Encounter (Signed)
Pt aware.

## 2011-04-30 ENCOUNTER — Telehealth: Payer: Self-pay | Admitting: Family Medicine

## 2011-04-30 NOTE — Telephone Encounter (Signed)
Pt needs refill on amphetamine-dextroamphetamine (ADDERALL, 20MG ,) 20 MG tablet

## 2011-04-30 NOTE — Telephone Encounter (Signed)
pls advise

## 2011-05-01 MED ORDER — AMPHETAMINE-DEXTROAMPHETAMINE 20 MG PO TABS: 20.0000 mg | ORAL_TABLET | Freq: Two times a day (BID) | ORAL | Status: DC

## 2011-05-01 NOTE — Telephone Encounter (Signed)
done

## 2011-05-01 NOTE — Telephone Encounter (Signed)
Scripts ready for pick up and left message for pt.

## 2011-07-23 ENCOUNTER — Other Ambulatory Visit (INDEPENDENT_AMBULATORY_CARE_PROVIDER_SITE_OTHER): Payer: 59

## 2011-07-23 DIAGNOSIS — Z0000 Encounter for general adult medical examination without abnormal findings: Secondary | ICD-10-CM

## 2011-07-23 LAB — HEPATIC FUNCTION PANEL
AST: 39 U/L — ABNORMAL HIGH (ref 0–37)
Albumin: 5.1 g/dL (ref 3.5–5.2)
Alkaline Phosphatase: 26 U/L — ABNORMAL LOW (ref 39–117)
Bilirubin, Direct: 0.1 mg/dL (ref 0.0–0.3)
Total Protein: 8.1 g/dL (ref 6.0–8.3)

## 2011-07-23 LAB — BASIC METABOLIC PANEL
Calcium: 9.9 mg/dL (ref 8.4–10.5)
GFR: 98.38 mL/min (ref >60.00)
Sodium: 139 mEq/L (ref 135–145)

## 2011-07-23 LAB — POCT URINALYSIS DIPSTICK
Ketones, UA: NEGATIVE
Leukocytes, UA: NEGATIVE
Nitrite, UA: NEGATIVE
Protein, UA: NEGATIVE
pH, UA: 7.5

## 2011-07-23 LAB — CBC WITH DIFFERENTIAL/PLATELET
Basophils Relative: 0.6 % (ref 0.0–3.0)
Hemoglobin: 16.4 g/dL (ref 13.0–17.0)
Lymphocytes Relative: 29.1 % (ref 12.0–46.0)
Monocytes Relative: 7.2 % (ref 3.0–12.0)
Neutro Abs: 4.7 10*3/uL (ref 1.4–7.7)
Neutrophils Relative %: 62.3 % (ref 43.0–77.0)
RBC: 4.95 Mil/uL (ref 4.22–5.81)
WBC: 7.6 10*3/uL (ref 4.5–10.5)

## 2011-07-23 LAB — LDL CHOLESTEROL, DIRECT: Direct LDL: 115.5 mg/dL

## 2011-07-23 LAB — LIPID PANEL
HDL: 52.3 mg/dL (ref >39.00)
Total CHOL/HDL Ratio: 4

## 2011-07-27 NOTE — Progress Notes (Signed)
Quick Note:  Spoke with pt ______ 

## 2011-07-30 ENCOUNTER — Ambulatory Visit (INDEPENDENT_AMBULATORY_CARE_PROVIDER_SITE_OTHER): Payer: 59 | Admitting: Family Medicine

## 2011-07-30 ENCOUNTER — Encounter: Payer: Self-pay | Admitting: Family Medicine

## 2011-07-30 VITALS — BP 130/88 | HR 93 | Temp 99.0°F | Ht 68.5 in | Wt 192.0 lb

## 2011-07-30 DIAGNOSIS — Z0000 Encounter for general adult medical examination without abnormal findings: Secondary | ICD-10-CM

## 2011-07-30 MED ORDER — ATORVASTATIN CALCIUM 40 MG PO TABS: 40.0000 mg | ORAL_TABLET | Freq: Every day | ORAL | Status: DC

## 2011-07-30 MED ORDER — FENOFIBRATE 160 MG PO TABS: 160.0000 mg | ORAL_TABLET | Freq: Every day | ORAL | Status: DC

## 2011-07-30 MED ORDER — ALUMINUM CHLORIDE 20 % EX SOLN: Freq: Every day | CUTANEOUS | Status: DC

## 2011-07-30 MED ORDER — AMPHETAMINE-DEXTROAMPHETAMINE 20 MG PO TABS: 20.0000 mg | ORAL_TABLET | Freq: Two times a day (BID) | ORAL | Status: DC

## 2011-07-30 NOTE — Progress Notes (Signed)
  Subjective:    Patient ID: Jordan Robles, male    DOB: 21-Aug-1981, 30 y.o.   MRN: [id number]  HPI 30 yr old male for a cpx. He feels well and has no concerns. He admits to putting on some weight and not getting much exercise.    Review of Systems  Constitutional: Negative.   HENT: Negative.   Eyes: Negative.   Respiratory: Negative.   Cardiovascular: Negative.   Gastrointestinal: Negative.   Genitourinary: Negative.   Musculoskeletal: Negative.   Skin: Negative.   Neurological: Negative.   Hematological: Negative.   Psychiatric/Behavioral: Negative.        Objective:   Physical Exam  Constitutional: He is oriented to person, place, and time. He appears well-developed and well-nourished. No distress.  HENT:  Head: Normocephalic and atraumatic.  Right Ear: External ear normal.  Left Ear: External ear normal.  Nose: Nose normal.  Mouth/Throat: Oropharynx is clear and moist. No oropharyngeal exudate.  Eyes: Conjunctivae and EOM are normal. Pupils are equal, round, and reactive to light. Right eye exhibits no discharge. Left eye exhibits no discharge. No scleral icterus.  Neck: Neck supple. No JVD present. No tracheal deviation present. No thyromegaly present.  Cardiovascular: Normal rate, regular rhythm, normal heart sounds and intact distal pulses.  Exam reveals no gallop and no friction rub.   No murmur heard. Pulmonary/Chest: Effort normal and breath sounds normal. No respiratory distress. He has no wheezes. He has no rales. He exhibits no tenderness.  Abdominal: Soft. Bowel sounds are normal. He exhibits no distension and no mass. There is no tenderness. There is no rebound and no guarding.  Genitourinary: Rectum normal, prostate normal and penis normal. Guaiac negative stool. Right testis shows no mass, no swelling and no tenderness. Right testis is descended. Cremasteric reflex is not absent on the right side. No penile tenderness.       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 He exhibits no edema and no tenderness.  Lymphadenopathy:    He has no cervical adenopathy.  Neurological: He is alert and oriented to person, place, and time. He has normal reflexes. No cranial nerve deficit. He exhibits normal muscle tone. Coordination normal.  Skin: Skin is warm and dry. No rash noted. He is not diaphoretic. No erythema. No pallor.  Psychiatric: He has a normal mood and affect. His behavior is normal. Judgment and thought content normal.          Assessment & Plan:  Well exam. Switch to Lipitor for the lipids.

## 2011-10-29 ENCOUNTER — Other Ambulatory Visit: Payer: Self-pay | Admitting: Family Medicine

## 2011-10-29 NOTE — Telephone Encounter (Signed)
Pt would like new rx generic adderall 20 mg. Pt would like to pick up rx tomorrow

## 2011-10-30 MED ORDER — AMPHETAMINE-DEXTROAMPHETAMINE 20 MG PO TABS: 20.0000 mg | ORAL_TABLET | Freq: Two times a day (BID) | ORAL | Status: DC

## 2011-10-30 NOTE — Telephone Encounter (Signed)
done

## 2011-10-30 NOTE — Telephone Encounter (Signed)
Scripts ready for pick up and left voice message for pt. 

## 2012-01-28 ENCOUNTER — Telehealth: Payer: Self-pay | Admitting: Family Medicine

## 2012-01-28 NOTE — Telephone Encounter (Signed)
Pt called req refill amphetamine-dextroamphetamine (ADDERALL, 20MG ,) 20 MG tablet . Pt out of med.

## 2012-01-29 MED ORDER — AMPHETAMINE-DEXTROAMPHETAMINE 20 MG PO TABS: 20.0000 mg | ORAL_TABLET | Freq: Two times a day (BID) | ORAL | Status: DC

## 2012-01-29 NOTE — Telephone Encounter (Signed)
done

## 2012-01-29 NOTE — Telephone Encounter (Signed)
Script is ready and left a voice message.

## 2012-02-19 ENCOUNTER — Telehealth: Payer: Self-pay

## 2012-02-19 NOTE — Telephone Encounter (Signed)
Pt has been taking the otc niancin for about a year.  Has been having the flushing about the whole time he has been on it, but he just keeps taking it anyhow.  The discoloration of his skin is starting to bother him though.  He has red splotches on his chest.  He stopped it for a few days and the redness got better.  He would like to know if this is harmful over time or if he should cut back to 500mg  from 1000mg  or if he should try a different med altogether.

## 2012-02-19 NOTE — Telephone Encounter (Signed)
I spoke with pt and went over below information. 

## 2012-02-19 NOTE — Telephone Encounter (Signed)
No the redness is not harmful in any way, but the flushing will probably not stop. This is a very common side effect of niacin. I suggest he stop this and take fish oil instead OTC. Try taking 2000 mg a day

## 2012-04-25 ENCOUNTER — Other Ambulatory Visit: Payer: Self-pay | Admitting: Family Medicine

## 2012-04-25 MED ORDER — AMPHETAMINE-DEXTROAMPHETAMINE 20 MG PO TABS: 20.0000 mg | ORAL_TABLET | Freq: Two times a day (BID) | ORAL | Status: DC

## 2012-04-25 NOTE — Telephone Encounter (Signed)
Patient called stating that he need a refill of his adderall. Please assist.  °

## 2012-04-25 NOTE — Telephone Encounter (Signed)
Please advise Last seen 07/30/11 Last written 01/19/12 x3

## 2012-04-25 NOTE — Telephone Encounter (Signed)
Done - in your box

## 2012-04-25 NOTE — Telephone Encounter (Signed)
Attempt to call - VM - LMTCB if questions - rx's ready for pick up 

## 2012-07-24 ENCOUNTER — Other Ambulatory Visit: Payer: 59

## 2012-07-28 ENCOUNTER — Other Ambulatory Visit: Payer: Self-pay | Admitting: Family Medicine

## 2012-07-28 MED ORDER — AMPHETAMINE-DEXTROAMPHETAMINE 20 MG PO TABS: 20.0000 mg | ORAL_TABLET | Freq: Two times a day (BID) | ORAL | Status: DC

## 2012-07-28 NOTE — Telephone Encounter (Signed)
Pt needs new rx generic adderall 20 mg. Pt has appt on 08-27-2012.

## 2012-07-28 NOTE — Telephone Encounter (Signed)
done

## 2012-07-28 NOTE — Telephone Encounter (Signed)
Script is ready for pick up and left a voice message for pt.  

## 2012-07-30 ENCOUNTER — Encounter: Payer: 59 | Admitting: Family Medicine

## 2012-07-30 ENCOUNTER — Other Ambulatory Visit: Payer: Self-pay | Admitting: Family Medicine

## 2012-08-21 ENCOUNTER — Other Ambulatory Visit (INDEPENDENT_AMBULATORY_CARE_PROVIDER_SITE_OTHER): Payer: PRIVATE HEALTH INSURANCE

## 2012-08-21 DIAGNOSIS — Z0000 Encounter for general adult medical examination without abnormal findings: Secondary | ICD-10-CM

## 2012-08-21 LAB — CBC WITH DIFFERENTIAL/PLATELET
Basophils Absolute: 0.1 10*3/uL (ref 0.0–0.1)
Basophils Relative: 0.6 % (ref 0.0–3.0)
Eosinophils Absolute: 0.1 10*3/uL (ref 0.0–0.7)
Lymphocytes Relative: 29.3 % (ref 12.0–46.0)
MCHC: 33.7 g/dL (ref 30.0–36.0)
Neutrophils Relative %: 60.2 % (ref 43.0–77.0)
RBC: 4.85 Mil/uL (ref 4.22–5.81)
RDW: 12.8 % (ref 11.5–14.6)

## 2012-08-21 LAB — LIPID PANEL
Cholesterol: 161 mg/dL (ref 0–200)
HDL: 37.4 mg/dL — ABNORMAL LOW (ref >39.00)
Total CHOL/HDL Ratio: 4
Triglycerides: 408 mg/dL — ABNORMAL HIGH (ref 0.0–149.0)
VLDL: 81.6 mg/dL — ABNORMAL HIGH (ref 0.0–40.0)

## 2012-08-21 LAB — HEPATIC FUNCTION PANEL
ALT: 48 U/L (ref 0–53)
AST: 34 U/L (ref 0–37)
Bilirubin, Direct: 0.2 mg/dL (ref 0.0–0.3)
Total Protein: 7.6 g/dL (ref 6.0–8.3)

## 2012-08-21 LAB — LDL CHOLESTEROL, DIRECT: Direct LDL: 78 mg/dL

## 2012-08-21 LAB — POCT URINALYSIS DIPSTICK
Blood, UA: NEGATIVE
Nitrite, UA: NEGATIVE
Urobilinogen, UA: 0.2
pH, UA: 6.5

## 2012-08-21 LAB — BASIC METABOLIC PANEL
Chloride: 103 mEq/L (ref 96–112)
Potassium: 4 mEq/L (ref 3.5–5.1)
Sodium: 138 mEq/L (ref 135–145)

## 2012-08-25 NOTE — Progress Notes (Signed)
Quick Note:  Pt has CPE on 08/27/12 and will go over then. ______

## 2012-08-27 ENCOUNTER — Encounter: Payer: Self-pay | Admitting: Family Medicine

## 2012-08-27 ENCOUNTER — Ambulatory Visit (INDEPENDENT_AMBULATORY_CARE_PROVIDER_SITE_OTHER): Payer: PRIVATE HEALTH INSURANCE | Admitting: Family Medicine

## 2012-08-27 VITALS — BP 128/82 | HR 84 | Temp 98.3°F | Ht 68.0 in | Wt 193.0 lb

## 2012-08-27 DIAGNOSIS — L909 Atrophic disorder of skin, unspecified: Secondary | ICD-10-CM

## 2012-08-27 DIAGNOSIS — L918 Other hypertrophic disorders of the skin: Secondary | ICD-10-CM

## 2012-08-27 DIAGNOSIS — Z0000 Encounter for general adult medical examination without abnormal findings: Secondary | ICD-10-CM

## 2012-08-27 DIAGNOSIS — B360 Pityriasis versicolor: Secondary | ICD-10-CM

## 2012-08-27 MED ORDER — FENOFIBRATE 160 MG PO TABS: 160.0000 mg | ORAL_TABLET | Freq: Every day | ORAL | Status: DC

## 2012-08-27 MED ORDER — ALUMINUM CHLORIDE 20 % EX SOLN: Freq: Two times a day (BID) | CUTANEOUS | Status: AC

## 2012-08-27 MED ORDER — FLUCONAZOLE 100 MG PO TABS: 100.0000 mg | ORAL_TABLET | Freq: Two times a day (BID) | ORAL | Status: DC

## 2012-08-27 MED ORDER — ATORVASTATIN CALCIUM 40 MG PO TABS: 40.0000 mg | ORAL_TABLET | Freq: Every day | ORAL | Status: DC

## 2012-08-27 NOTE — Progress Notes (Signed)
  Subjective:    Patient ID: Jordan Robles, male    DOB: [birth year], 31 y.o.   MRN: [id number]  HPI 31 yr old male for a cpx. He feels fine in general btu asks about 2 issues. He is doing well on his Adderall. He has had an asymptomatic rash on the chest for several months. Also he asks if we can remove a skin tag from his eyelid that appeared about a year ago.    Review of Systems  Constitutional: Negative.   HENT: Negative.   Eyes: Negative.   Respiratory: Negative.   Cardiovascular: Negative.   Gastrointestinal: Negative.   Genitourinary: Negative.   Musculoskeletal: Negative.   Skin: Positive for rash. Negative for color change, pallor and wound.  Neurological: Negative.   Hematological: Negative.   Psychiatric/Behavioral: Negative.        Objective:   Physical Exam  Constitutional: He is oriented to person, place, and time. He appears well-developed and well-nourished. No distress.  HENT:  Head: Normocephalic and atraumatic.  Right Ear: External ear normal.  Left Ear: External ear normal.  Nose: Nose normal.  Mouth/Throat: Oropharynx is clear and moist. No oropharyngeal exudate.  Eyes: Conjunctivae normal and EOM are normal. Pupils are equal, round, and reactive to light. Right eye exhibits no discharge. Left eye exhibits no discharge. No scleral icterus.       Tiny skin tag on the left upper eyelid   Neck: Neck supple. No JVD present. No tracheal deviation present. No thyromegaly present.  Cardiovascular: Normal rate, regular rhythm, normal heart sounds and intact distal pulses.  Exam reveals no gallop and no friction rub.   No murmur heard. Pulmonary/Chest: Effort normal and breath sounds normal. No respiratory distress. He has no wheezes. He has no rales. He exhibits no tenderness.  Abdominal: Soft. Bowel sounds are normal. He exhibits no distension and no mass. There is no tenderness. There is no rebound and no guarding.  Genitourinary: Rectum normal, prostate normal  and penis normal. Guaiac negative stool. No penile tenderness.  Musculoskeletal: Normal range of motion. He exhibits no edema and no tenderness.  Lymphadenopathy:    He has no cervical adenopathy.  Neurological: He is alert and oriented to person, place, and time. He has normal reflexes. No cranial nerve deficit. He exhibits normal muscle tone. Coordination normal.  Skin: Skin is warm and dry. He is not diaphoretic. No erythema. No pallor.       Scattered macular pink areas over the chest   Psychiatric: He has a normal mood and affect. His behavior is normal. Judgment and thought content normal.          Assessment & Plan:  Well eaxm. For the tinea versicolor he will apply Selsun Blue shampoo bid until clear and use Fluconazole bid for 90 days. We excised the skin tag today with scissors.

## 2012-10-24 ENCOUNTER — Telehealth: Payer: Self-pay | Admitting: Family Medicine

## 2012-10-24 NOTE — Telephone Encounter (Signed)
Patient called stating that he need a refill of his adderall 20mg  1po bid. Please assist.

## 2012-10-27 MED ORDER — AMPHETAMINE-DEXTROAMPHETAMINE 20 MG PO TABS: 20.0000 mg | ORAL_TABLET | Freq: Two times a day (BID) | ORAL | Status: DC

## 2012-10-27 NOTE — Telephone Encounter (Signed)
Script is ready for pick up and left voice message. 

## 2012-10-27 NOTE — Telephone Encounter (Signed)
done

## 2013-01-20 ENCOUNTER — Telehealth: Payer: Self-pay | Admitting: Family Medicine

## 2013-01-20 DIAGNOSIS — Z309 Encounter for contraceptive management, unspecified: Secondary | ICD-10-CM

## 2013-01-20 MED ORDER — AMPHETAMINE-DEXTROAMPHETAMINE 20 MG PO TABS: 20.0000 mg | ORAL_TABLET | Freq: Two times a day (BID) | ORAL | Status: DC

## 2013-01-20 NOTE — Telephone Encounter (Signed)
Pt would like referral to urologist for a vasectomy. Pls call pt on cell phone.

## 2013-01-20 NOTE — Telephone Encounter (Signed)
Script is ready for pick up and left voice message. 

## 2013-01-20 NOTE — Telephone Encounter (Signed)
PT called to request a refill of his amphetamine-dextroamphetamine (ADDERALL) 20 MG tablet. He takes this twice a day. Please assist.

## 2013-01-20 NOTE — Telephone Encounter (Signed)
done

## 2013-04-28 ENCOUNTER — Telehealth: Payer: Self-pay | Admitting: Family Medicine

## 2013-04-28 MED ORDER — AMPHETAMINE-DEXTROAMPHETAMINE 20 MG PO TABS: 20.0000 mg | ORAL_TABLET | Freq: Two times a day (BID) | ORAL | Status: DC

## 2013-04-28 NOTE — Telephone Encounter (Signed)
PT is calling to request a 3 month supply of amphetamine-dextroamphetamine (ADDERALL) 20 MG tablet. Please assist.

## 2013-04-28 NOTE — Telephone Encounter (Signed)
Script is ready for pick up and left voice message.

## 2013-04-28 NOTE — Telephone Encounter (Signed)
Can we write for a 30 day supply of Adderall 20 mg take 1 po bid? Pt last seen on 08/27/12 and script last filled on 03/22/13.

## 2013-04-28 NOTE — Telephone Encounter (Signed)
Refill OK

## 2013-05-26 ENCOUNTER — Telehealth: Payer: Self-pay | Admitting: Family Medicine

## 2013-05-26 NOTE — Telephone Encounter (Signed)
Pt request refill of amphetamine-dextroamphetamine (ADDERALL) 20 MG tablet  1/ bid

## 2013-05-27 MED ORDER — AMPHETAMINE-DEXTROAMPHETAMINE 20 MG PO TABS: 20.0000 mg | ORAL_TABLET | Freq: Two times a day (BID) | ORAL | Status: DC

## 2013-05-27 NOTE — Telephone Encounter (Signed)
Script is ready for pick up and left voice message. 

## 2013-05-27 NOTE — Telephone Encounter (Signed)
done

## 2013-07-17 ENCOUNTER — Telehealth: Payer: Self-pay | Admitting: Family Medicine

## 2013-07-17 NOTE — Telephone Encounter (Signed)
TC to patient regarding "bite or sting on left calf."  On callback, RN unable to reach patient; message left on identified voicemail to contact office for assistance.  krs/can

## 2013-07-20 ENCOUNTER — Ambulatory Visit (INDEPENDENT_AMBULATORY_CARE_PROVIDER_SITE_OTHER): Payer: BC Managed Care – PPO | Admitting: Family Medicine

## 2013-07-20 ENCOUNTER — Encounter: Payer: Self-pay | Admitting: Family Medicine

## 2013-07-20 DIAGNOSIS — T6391XA Toxic effect of contact with unspecified venomous animal, accidental (unintentional), initial encounter: Secondary | ICD-10-CM

## 2013-07-20 DIAGNOSIS — T63391A Toxic effect of venom of other spider, accidental (unintentional), initial encounter: Secondary | ICD-10-CM

## 2013-07-20 NOTE — Progress Notes (Signed)
  Subjective:    Patient ID: Jordan Robles, male    DOB: 03-12-1981, 32 y.o.   MRN: [id number]  HPI Here for 2 weeks of a slightly itchy red spot on the left lower leg. He does not remember any trauma. He feels well in general.    Review of Systems  Constitutional: Negative.   Skin: Positive for rash.       Objective:   Physical Exam  Constitutional: He appears well-developed and well-nourished.  Skin:  Left lower leg has a macular red area about 1 cm in diameter, no induration or tenderness           Assessment & Plan:  It is difficult to tell if this is the residual inflammation from an insect bite or a spot of eczema. Either way using 1% hydrocortisone cream 2-3 times daily should help to resolve it

## 2013-08-23 ENCOUNTER — Telehealth: Payer: Self-pay | Admitting: Family Medicine

## 2013-08-26 MED ORDER — AMPHETAMINE-DEXTROAMPHETAMINE 20 MG PO TABS: 20.0000 mg | ORAL_TABLET | Freq: Two times a day (BID) | ORAL | Status: DC

## 2013-08-26 NOTE — Telephone Encounter (Signed)
Written for one month only. He needs testing and a contract

## 2013-08-26 NOTE — Telephone Encounter (Signed)
Script is ready for pick up, contract printed and I left a voice message with the below information.

## 2013-08-31 ENCOUNTER — Other Ambulatory Visit: Payer: Self-pay | Admitting: Family Medicine

## 2013-09-08 ENCOUNTER — Other Ambulatory Visit (INDEPENDENT_AMBULATORY_CARE_PROVIDER_SITE_OTHER): Payer: BC Managed Care – PPO

## 2013-09-08 DIAGNOSIS — Z0000 Encounter for general adult medical examination without abnormal findings: Secondary | ICD-10-CM

## 2013-09-08 LAB — HEPATIC FUNCTION PANEL
ALT: 59 U/L — ABNORMAL HIGH (ref 0–53)
AST: 35 U/L (ref 0–37)
Alkaline Phosphatase: 26 U/L — ABNORMAL LOW (ref 39–117)
Bilirubin, Direct: 0.2 mg/dL (ref 0.0–0.3)
Total Bilirubin: 1.1 mg/dL (ref 0.3–1.2)

## 2013-09-08 LAB — POCT URINALYSIS DIPSTICK
Ketones, UA: NEGATIVE
Leukocytes, UA: NEGATIVE
Nitrite, UA: NEGATIVE
Protein, UA: NEGATIVE
Urobilinogen, UA: 0.2
pH, UA: 7

## 2013-09-08 LAB — BASIC METABOLIC PANEL
Calcium: 9.3 mg/dL (ref 8.4–10.5)
Creatinine, Ser: 0.9 mg/dL (ref 0.4–1.5)
Sodium: 139 mEq/L (ref 135–145)

## 2013-09-08 LAB — CBC WITH DIFFERENTIAL/PLATELET
Basophils Relative: 0.9 % (ref 0.0–3.0)
Eosinophils Relative: 2.2 % (ref 0.0–5.0)
Hemoglobin: 15.6 g/dL (ref 13.0–17.0)
Lymphocytes Relative: 31.8 % (ref 12.0–46.0)
Monocytes Relative: 8.7 % (ref 3.0–12.0)
Neutro Abs: 4 10*3/uL (ref 1.4–7.7)
Neutrophils Relative %: 56.4 % (ref 43.0–77.0)
RBC: 4.96 Mil/uL (ref 4.22–5.81)
WBC: 7.1 10*3/uL (ref 4.5–10.5)

## 2013-09-08 LAB — LDL CHOLESTEROL, DIRECT: Direct LDL: 71 mg/dL

## 2013-09-08 LAB — LIPID PANEL
HDL: 36.5 mg/dL — ABNORMAL LOW (ref >39.00)
Total CHOL/HDL Ratio: 4
Triglycerides: 360 mg/dL — ABNORMAL HIGH (ref 0.0–149.0)
VLDL: 72 mg/dL — ABNORMAL HIGH (ref 0.0–40.0)

## 2013-09-18 ENCOUNTER — Encounter: Payer: Self-pay | Admitting: Family Medicine

## 2013-09-18 ENCOUNTER — Ambulatory Visit (INDEPENDENT_AMBULATORY_CARE_PROVIDER_SITE_OTHER): Payer: BC Managed Care – PPO | Admitting: Family Medicine

## 2013-09-18 VITALS — BP 132/82 | HR 88 | Temp 98.5°F | Ht 68.0 in | Wt 193.0 lb

## 2013-09-18 DIAGNOSIS — Z0000 Encounter for general adult medical examination without abnormal findings: Secondary | ICD-10-CM

## 2013-09-18 MED ORDER — ALUMINUM CHLORIDE 20 % EX SOLN: Freq: Every day | CUTANEOUS | Status: DC

## 2013-09-18 MED ORDER — ATORVASTATIN CALCIUM 40 MG PO TABS: ORAL_TABLET | ORAL | Status: DC

## 2013-09-18 MED ORDER — FENOFIBRATE 160 MG PO TABS: ORAL_TABLET | ORAL | Status: DC

## 2013-09-18 NOTE — Progress Notes (Signed)
   Subjective:    Patient ID: Jordan Robles, male    DOB: August 18, 1981, 33 y.o.   MRN: [id number]  HPI 33 yr old male for a cpx. He feels well.    Review of Systems  Constitutional: Negative.   HENT: Negative.   Eyes: Negative.   Respiratory: Negative.   Cardiovascular: Negative.   Gastrointestinal: Negative.   Genitourinary: Negative.   Musculoskeletal: Negative.   Skin: Negative.   Neurological: Negative.   Psychiatric/Behavioral: Negative.        Objective:   Physical Exam  Constitutional: He is oriented to person, place, and time. He appears well-developed and well-nourished. No distress.  HENT:  Head: Normocephalic and atraumatic.  Right Ear: External ear normal.  Left Ear: External ear normal.  Nose: Nose normal.  Mouth/Throat: Oropharynx is clear and moist. No oropharyngeal exudate.  Eyes: Conjunctivae and EOM are normal. Pupils are equal, round, and reactive to light. Right eye exhibits no discharge. Left eye exhibits no discharge. No scleral icterus.  Neck: Neck supple. No JVD present. No tracheal deviation present. No thyromegaly present.  Cardiovascular: Normal rate, regular rhythm, normal heart sounds and intact distal pulses.  Exam reveals no gallop and no friction rub.   No murmur heard. Pulmonary/Chest: Effort normal and breath sounds normal. No respiratory distress. He has no wheezes. He has no rales. He exhibits no tenderness.  Abdominal: Soft. Bowel sounds are normal. He exhibits no distension and no mass. There is no tenderness. There is no rebound and no guarding.  Genitourinary: Rectum normal, prostate normal and penis normal. Guaiac negative stool. No penile tenderness.  Musculoskeletal: Normal range of motion. He exhibits no edema and no tenderness.  Lymphadenopathy:    He has no cervical adenopathy.  Neurological: He is alert and oriented to person, place, and time. He has normal reflexes. No cranial nerve deficit. He exhibits normal muscle tone.  Coordination normal.  Skin: Skin is warm and dry. No rash noted. He is not diaphoretic. No erythema. No pallor.  Psychiatric: He has a normal mood and affect. His behavior is normal. Judgment and thought content normal.          Assessment & Plan:  Well exam.

## 2013-09-23 ENCOUNTER — Telehealth: Payer: Self-pay | Admitting: Family Medicine

## 2013-09-23 NOTE — Telephone Encounter (Signed)
Pt request 3 rx (90 day) amphetamine-dextroamphetamine (ADDERALL) 20 MG tablet   1/ bid

## 2013-09-24 MED ORDER — AMPHETAMINE-DEXTROAMPHETAMINE 20 MG PO TABS: 20.0000 mg | ORAL_TABLET | Freq: Two times a day (BID) | ORAL | Status: DC

## 2013-09-24 NOTE — Telephone Encounter (Signed)
done

## 2013-09-25 NOTE — Telephone Encounter (Signed)
Script is ready for pick up, tried to reach pt and no answer.  

## 2013-10-14 ENCOUNTER — Encounter: Payer: Self-pay | Admitting: Family Medicine

## 2013-12-21 ENCOUNTER — Other Ambulatory Visit: Payer: Self-pay | Admitting: Family Medicine

## 2013-12-23 ENCOUNTER — Other Ambulatory Visit: Payer: Self-pay | Admitting: Family

## 2013-12-23 MED ORDER — AMPHETAMINE-DEXTROAMPHETAMINE 20 MG PO TABS: 20.0000 mg | ORAL_TABLET | Freq: Two times a day (BID) | ORAL | Status: DC

## 2014-01-20 ENCOUNTER — Telehealth: Payer: Self-pay | Admitting: Family

## 2014-01-21 MED ORDER — AMPHETAMINE-DEXTROAMPHETAMINE 20 MG PO TABS: 20.0000 mg | ORAL_TABLET | Freq: Two times a day (BID) | ORAL | Status: DC

## 2014-01-21 NOTE — Telephone Encounter (Signed)
Scripts are ready for pick up and I sent a note through my chart to let pt know.

## 2014-01-21 NOTE — Telephone Encounter (Signed)
done

## 2014-02-02 ENCOUNTER — Encounter: Payer: Self-pay | Admitting: Family Medicine

## 2014-02-03 NOTE — Telephone Encounter (Signed)
Tell him that he could simply fax me this form and I can fill it out.

## 2014-04-20 ENCOUNTER — Telehealth: Payer: Self-pay | Admitting: Family Medicine

## 2014-04-20 MED ORDER — AMPHETAMINE-DEXTROAMPHETAMINE 20 MG PO TABS: 20.0000 mg | ORAL_TABLET | Freq: Two times a day (BID) | ORAL | Status: DC

## 2014-04-20 NOTE — Telephone Encounter (Signed)
Script is ready for pick up and left a voice message.

## 2014-04-20 NOTE — Telephone Encounter (Signed)
done

## 2014-07-16 ENCOUNTER — Other Ambulatory Visit: Payer: Self-pay | Admitting: Family Medicine

## 2014-07-20 ENCOUNTER — Encounter: Payer: Self-pay | Admitting: Family Medicine

## 2014-07-20 ENCOUNTER — Telehealth: Payer: Self-pay | Admitting: Family Medicine

## 2014-07-20 NOTE — Telephone Encounter (Signed)
Pt request refill of the following: amphetamine-dextroamphetamine (ADDERALL) 20 MG tablet   Phamacy: pick up

## 2014-07-22 MED ORDER — AMPHETAMINE-DEXTROAMPHETAMINE 20 MG PO TABS: 20.0000 mg | ORAL_TABLET | Freq: Two times a day (BID) | ORAL | Status: DC

## 2014-07-22 NOTE — Telephone Encounter (Signed)
done

## 2014-07-22 NOTE — Telephone Encounter (Signed)
Pt is aware rx is ready for pick up. 

## 2014-09-12 ENCOUNTER — Encounter: Payer: Self-pay | Admitting: Family Medicine

## 2014-09-13 NOTE — Telephone Encounter (Signed)
Tell him that we will NOT be doing drug testing again this year

## 2014-09-24 ENCOUNTER — Other Ambulatory Visit (INDEPENDENT_AMBULATORY_CARE_PROVIDER_SITE_OTHER): Payer: BLUE CROSS/BLUE SHIELD

## 2014-09-24 DIAGNOSIS — E785 Hyperlipidemia, unspecified: Secondary | ICD-10-CM

## 2014-09-24 DIAGNOSIS — Z0000 Encounter for general adult medical examination without abnormal findings: Secondary | ICD-10-CM

## 2014-09-24 LAB — HEPATIC FUNCTION PANEL
ALT: 60 U/L — AB (ref 0–53)
AST: 35 U/L (ref 0–37)
Albumin: 4.9 g/dL (ref 3.5–5.2)
Alkaline Phosphatase: 25 U/L — ABNORMAL LOW (ref 39–117)
Bilirubin, Direct: 0 mg/dL (ref 0.0–0.3)
TOTAL PROTEIN: 7.7 g/dL (ref 6.0–8.3)
Total Bilirubin: 0.8 mg/dL (ref 0.2–1.2)

## 2014-09-24 LAB — POCT URINALYSIS DIPSTICK
BILIRUBIN UA: NEGATIVE
Glucose, UA: NEGATIVE
Ketones, UA: NEGATIVE
Leukocytes, UA: NEGATIVE
Nitrite, UA: NEGATIVE
PROTEIN UA: 6
UROBILINOGEN UA: 0.2
pH, UA: 6

## 2014-09-24 LAB — BASIC METABOLIC PANEL
BUN: 15 mg/dL (ref 6–23)
CO2: 26 meq/L (ref 19–32)
Calcium: 9.8 mg/dL (ref 8.4–10.5)
Chloride: 105 mEq/L (ref 96–112)
Creatinine, Ser: 1 mg/dL (ref 0.4–1.5)
GFR: 86.88 mL/min (ref >60.00)
Glucose, Bld: 101 mg/dL — ABNORMAL HIGH (ref 70–99)
Potassium: 4.4 mEq/L (ref 3.5–5.1)
SODIUM: 141 meq/L (ref 135–145)

## 2014-09-24 LAB — LIPID PANEL
Cholesterol: 150 mg/dL (ref 0–200)
HDL: 32.9 mg/dL — ABNORMAL LOW (ref >39.00)
NONHDL: 117.1
Total CHOL/HDL Ratio: 5
Triglycerides: 353 mg/dL — ABNORMAL HIGH (ref 0.0–149.0)
VLDL: 70.6 mg/dL — ABNORMAL HIGH (ref 0.0–40.0)

## 2014-09-24 LAB — CBC WITH DIFFERENTIAL/PLATELET
BASOS ABS: 0.1 10*3/uL (ref 0.0–0.1)
Basophils Relative: 0.7 % (ref 0.0–3.0)
Eosinophils Absolute: 0.2 10*3/uL (ref 0.0–0.7)
Eosinophils Relative: 2.2 % (ref 0.0–5.0)
HCT: 46.8 % (ref 39.0–52.0)
Hemoglobin: 15.6 g/dL (ref 13.0–17.0)
LYMPHS ABS: 1.9 10*3/uL (ref 0.7–4.0)
Lymphocytes Relative: 25.1 % (ref 12.0–46.0)
MCHC: 33.3 g/dL (ref 30.0–36.0)
MCV: 92.4 fl (ref 78.0–100.0)
MONOS PCT: 8.7 % (ref 3.0–12.0)
Monocytes Absolute: 0.6 10*3/uL (ref 0.1–1.0)
NEUTROS ABS: 4.7 10*3/uL (ref 1.4–7.7)
Neutrophils Relative %: 63.3 % (ref 43.0–77.0)
Platelets: 299 10*3/uL (ref 150.0–400.0)
RBC: 5.07 Mil/uL (ref 4.22–5.81)
RDW: 13.2 % (ref 11.5–15.5)
WBC: 7.4 10*3/uL (ref 4.0–10.5)

## 2014-09-24 LAB — TSH: TSH: 2.85 u[IU]/mL (ref 0.35–4.50)

## 2014-09-24 LAB — LDL CHOLESTEROL, DIRECT: Direct LDL: 72 mg/dL

## 2014-10-01 ENCOUNTER — Encounter: Payer: Self-pay | Admitting: Family Medicine

## 2014-10-01 ENCOUNTER — Ambulatory Visit (INDEPENDENT_AMBULATORY_CARE_PROVIDER_SITE_OTHER): Payer: BLUE CROSS/BLUE SHIELD | Admitting: Family Medicine

## 2014-10-01 VITALS — BP 137/86 | HR 67 | Temp 98.5°F | Ht 68.0 in | Wt 194.0 lb

## 2014-10-01 DIAGNOSIS — Z0000 Encounter for general adult medical examination without abnormal findings: Secondary | ICD-10-CM

## 2014-10-01 MED ORDER — FENOFIBRATE 160 MG PO TABS: ORAL_TABLET | ORAL | Status: DC

## 2014-10-01 MED ORDER — ATORVASTATIN CALCIUM 40 MG PO TABS
ORAL_TABLET | ORAL | Status: DC
Start: 2014-10-01 — End: 2015-10-14

## 2014-10-01 MED ORDER — AMPHETAMINE-DEXTROAMPHETAMINE 20 MG PO TABS: 20.0000 mg | ORAL_TABLET | Freq: Two times a day (BID) | ORAL | Status: DC

## 2014-10-01 MED ORDER — ALUMINUM CHLORIDE 20 % EX SOLN: Freq: Every day | CUTANEOUS | Status: DC

## 2014-10-01 NOTE — Telephone Encounter (Signed)
Lm on vm for pt to cb and schedule inj.

## 2014-10-01 NOTE — Telephone Encounter (Signed)
Can you call pt just to schedule an injection?

## 2014-10-01 NOTE — Progress Notes (Signed)
   Subjective:    Patient ID: Jordan Robles, male    DOB: 09-05-1981, 34 y.o.   MRN: [id number]  HPI 34 yr old male for a cpx. He feels well.    Review of Systems  Constitutional: Negative.   HENT: Negative.   Eyes: Negative.   Respiratory: Negative.   Cardiovascular: Negative.   Gastrointestinal: Negative.   Genitourinary: Negative.   Musculoskeletal: Negative.   Skin: Negative.   Neurological: Negative.   Psychiatric/Behavioral: Negative.        Objective:   Physical Exam  Constitutional: He is oriented to person, place, and time. He appears well-developed and well-nourished. No distress.  HENT:  Head: Normocephalic and atraumatic.  Right Ear: External ear normal.  Left Ear: External ear normal.  Nose: Nose normal.  Mouth/Throat: Oropharynx is clear and moist. No oropharyngeal exudate.  Eyes: Conjunctivae and EOM are normal. Pupils are equal, round, and reactive to light. Right eye exhibits no discharge. Left eye exhibits no discharge. No scleral icterus.  Neck: Neck supple. No JVD present. No tracheal deviation present. No thyromegaly present.  Cardiovascular: Normal rate, regular rhythm, normal heart sounds and intact distal pulses.  Exam reveals no gallop and no friction rub.   No murmur heard. Pulmonary/Chest: Effort normal and breath sounds normal. No respiratory distress. He has no wheezes. He has no rales. He exhibits no tenderness.  Abdominal: Soft. Bowel sounds are normal. He exhibits no distension and no mass. There is no tenderness. There is no rebound and no guarding.  Genitourinary: Rectum normal, prostate normal and penis normal. Guaiac negative stool. No penile tenderness.  Musculoskeletal: Normal range of motion. He exhibits no edema or tenderness.  Lymphadenopathy:    He has no cervical adenopathy.  Neurological: He is alert and oriented to person, place, and time. He has normal reflexes. No cranial nerve deficit. He exhibits normal muscle tone.  Coordination normal.  Skin: Skin is warm and dry. No rash noted. He is not diaphoretic. No erythema. No pallor.  Psychiatric: He has a normal mood and affect. His behavior is normal. Judgment and thought content normal.          Assessment & Plan:  Well exam.

## 2014-10-01 NOTE — Progress Notes (Signed)
Pre visit review using our clinic review tool, if applicable. No additional management support is needed unless otherwise documented below in the visit note. 

## 2014-10-08 ENCOUNTER — Ambulatory Visit: Payer: BLUE CROSS/BLUE SHIELD | Admitting: Family Medicine

## 2014-10-15 ENCOUNTER — Ambulatory Visit (INDEPENDENT_AMBULATORY_CARE_PROVIDER_SITE_OTHER): Payer: BLUE CROSS/BLUE SHIELD | Admitting: Family Medicine

## 2014-10-15 DIAGNOSIS — Z23 Encounter for immunization: Secondary | ICD-10-CM

## 2014-12-06 ENCOUNTER — Other Ambulatory Visit: Payer: Self-pay

## 2014-12-06 MED ORDER — FENOFIBRATE 160 MG PO TABS: ORAL_TABLET | ORAL | Status: DC

## 2014-12-06 NOTE — Telephone Encounter (Signed)
Rx request for Fenofibrate 160 mg tablets #90  Pharm:  [facility]   Rx sent to pharmacy.

## 2015-01-16 ENCOUNTER — Telehealth: Payer: Self-pay | Admitting: Family Medicine

## 2015-01-18 MED ORDER — AMPHETAMINE-DEXTROAMPHETAMINE 20 MG PO TABS: 20.0000 mg | ORAL_TABLET | Freq: Two times a day (BID) | ORAL | Status: DC

## 2015-01-18 NOTE — Telephone Encounter (Signed)
Script is ready for pick up and I spoke with pt.  

## 2015-01-18 NOTE — Telephone Encounter (Signed)
done

## 2015-01-18 NOTE — Addendum Note (Signed)
Addended by: Alysia Penna A on: 01/18/2015 01:07 PM   Modules accepted: Orders

## 2015-03-14 ENCOUNTER — Ambulatory Visit (INDEPENDENT_AMBULATORY_CARE_PROVIDER_SITE_OTHER): Payer: BLUE CROSS/BLUE SHIELD | Admitting: Family Medicine

## 2015-03-14 ENCOUNTER — Encounter: Payer: Self-pay | Admitting: Family Medicine

## 2015-03-14 VITALS — BP 138/83 | HR 80 | Temp 98.2°F | Ht 68.0 in | Wt 191.0 lb

## 2015-03-14 DIAGNOSIS — L309 Dermatitis, unspecified: Secondary | ICD-10-CM

## 2015-03-14 MED ORDER — HALOBETASOL PROPIONATE 0.05 % EX CREA: TOPICAL_CREAM | Freq: Two times a day (BID) | CUTANEOUS | Status: DC

## 2015-03-14 NOTE — Progress Notes (Signed)
Pre visit review using our clinic review tool, if applicable. No additional management support is needed unless otherwise documented below in the visit note. 

## 2015-03-14 NOTE — Progress Notes (Signed)
   Subjective:    Patient ID: Jordan Robles, male    DOB: 1981-04-28, 34 y.o.   MRN: [id number]  HPI Here for 6 months of an itchy patch of rash on the left lower leg. Using OTC cortisone cream with no benefit.    Review of Systems  Constitutional: Negative.   Skin: Positive for rash.       Objective:   Physical Exam  Constitutional: He appears well-developed and well-nourished.  Skin:  Left lower leg has a patch of red, scaly skin           Assessment & Plan:  Eczema. Try Halobetasol cream.

## 2015-03-25 ENCOUNTER — Encounter: Payer: Self-pay | Admitting: Family Medicine

## 2015-03-29 NOTE — Telephone Encounter (Signed)
It may take treatments up to 4-6 weeks to see any dramatic results

## 2015-04-17 ENCOUNTER — Telehealth: Payer: Self-pay | Admitting: Family Medicine

## 2015-04-18 MED ORDER — AMPHETAMINE-DEXTROAMPHETAMINE 20 MG PO TABS: 20.0000 mg | ORAL_TABLET | Freq: Two times a day (BID) | ORAL | Status: DC

## 2015-04-18 NOTE — Addendum Note (Signed)
Addended by: Alysia Penna A on: 04/18/2015 10:04 AM   Modules accepted: Orders

## 2015-04-18 NOTE — Telephone Encounter (Signed)
Script is ready for pick up and I sent pt a my chart message.  

## 2015-04-18 NOTE — Telephone Encounter (Signed)
done

## 2015-05-06 ENCOUNTER — Encounter: Payer: Self-pay | Admitting: Family Medicine

## 2015-05-06 NOTE — Telephone Encounter (Signed)
Actually I would expect the rash to be gone by now, since the cream he is using is fairly potent. This may not be simple eczema. I suggest he see a Dermatologist to look at this. If he agrees, I will do a referral

## 2015-07-13 ENCOUNTER — Encounter: Payer: Self-pay | Admitting: Family Medicine

## 2015-07-13 ENCOUNTER — Telehealth: Payer: Self-pay | Admitting: Family Medicine

## 2015-07-14 MED ORDER — AMPHETAMINE-DEXTROAMPHETAMINE 20 MG PO TABS: 20.0000 mg | ORAL_TABLET | Freq: Two times a day (BID) | ORAL | Status: DC

## 2015-07-14 NOTE — Telephone Encounter (Signed)
done

## 2015-07-14 NOTE — Addendum Note (Signed)
Addended by: Alysia Penna A on: 07/14/2015 12:10 PM   Modules accepted: Orders

## 2015-07-14 NOTE — Telephone Encounter (Signed)
Script is ready for pick up and I sent pt a my chart message.  

## 2015-10-07 ENCOUNTER — Other Ambulatory Visit (INDEPENDENT_AMBULATORY_CARE_PROVIDER_SITE_OTHER): Payer: BLUE CROSS/BLUE SHIELD

## 2015-10-07 DIAGNOSIS — Z0000 Encounter for general adult medical examination without abnormal findings: Secondary | ICD-10-CM

## 2015-10-07 DIAGNOSIS — R7989 Other specified abnormal findings of blood chemistry: Secondary | ICD-10-CM

## 2015-10-07 LAB — BASIC METABOLIC PANEL
BUN: 11 mg/dL (ref 6–23)
CHLORIDE: 104 meq/L (ref 96–112)
CO2: 28 mEq/L (ref 19–32)
Calcium: 10.1 mg/dL (ref 8.4–10.5)
Creatinine, Ser: 0.99 mg/dL (ref 0.40–1.50)
GFR: 90.42 mL/min (ref >60.00)
GLUCOSE: 92 mg/dL (ref 70–99)
Potassium: 5 mEq/L (ref 3.5–5.1)
Sodium: 141 mEq/L (ref 135–145)

## 2015-10-07 LAB — CBC WITH DIFFERENTIAL/PLATELET
BASOS ABS: 0 10*3/uL (ref 0.0–0.1)
Basophils Relative: 0.7 % (ref 0.0–3.0)
EOS ABS: 0.2 10*3/uL (ref 0.0–0.7)
Eosinophils Relative: 2.4 % (ref 0.0–5.0)
HEMATOCRIT: 46.8 % (ref 39.0–52.0)
HEMOGLOBIN: 15.6 g/dL (ref 13.0–17.0)
LYMPHS PCT: 29.8 % (ref 12.0–46.0)
Lymphs Abs: 2.1 10*3/uL (ref 0.7–4.0)
MCHC: 33.4 g/dL (ref 30.0–36.0)
MCV: 92.8 fl (ref 78.0–100.0)
Monocytes Absolute: 0.6 10*3/uL (ref 0.1–1.0)
Monocytes Relative: 8.6 % (ref 3.0–12.0)
Neutro Abs: 4.1 10*3/uL (ref 1.4–7.7)
Neutrophils Relative %: 58.5 % (ref 43.0–77.0)
PLATELETS: 388 10*3/uL (ref 150.0–400.0)
RBC: 5.04 Mil/uL (ref 4.22–5.81)
RDW: 13.3 % (ref 11.5–15.5)
WBC: 7 10*3/uL (ref 4.0–10.5)

## 2015-10-07 LAB — HEPATIC FUNCTION PANEL
ALBUMIN: 4.8 g/dL (ref 3.5–5.2)
ALK PHOS: 28 U/L — AB (ref 39–117)
ALT: 39 U/L (ref 0–53)
AST: 25 U/L (ref 0–37)
BILIRUBIN DIRECT: 0.1 mg/dL (ref 0.0–0.3)
Total Bilirubin: 0.7 mg/dL (ref 0.2–1.2)
Total Protein: 7.1 g/dL (ref 6.0–8.3)

## 2015-10-07 LAB — POCT URINALYSIS DIPSTICK
BILIRUBIN UA: NEGATIVE
Glucose, UA: NEGATIVE
KETONES UA: NEGATIVE
LEUKOCYTES UA: NEGATIVE
NITRITE UA: NEGATIVE
PH UA: 5.5
PROTEIN UA: NEGATIVE
RBC UA: NEGATIVE
Spec Grav, UA: 1.02
Urobilinogen, UA: 0.2

## 2015-10-07 LAB — LIPID PANEL
Cholesterol: 153 mg/dL (ref 0–200)
HDL: 36.3 mg/dL — AB (ref >39.00)
NONHDL: 117
Total CHOL/HDL Ratio: 4
Triglycerides: 328 mg/dL — ABNORMAL HIGH (ref 0.0–149.0)
VLDL: 65.6 mg/dL — ABNORMAL HIGH (ref 0.0–40.0)

## 2015-10-07 LAB — LDL CHOLESTEROL, DIRECT: LDL DIRECT: 81 mg/dL

## 2015-10-07 LAB — TSH: TSH: 3.51 u[IU]/mL (ref 0.35–4.50)

## 2015-10-10 ENCOUNTER — Encounter: Payer: Self-pay | Admitting: Family Medicine

## 2015-10-11 ENCOUNTER — Other Ambulatory Visit: Payer: Self-pay | Admitting: Family Medicine

## 2015-10-14 ENCOUNTER — Encounter: Payer: Self-pay | Admitting: Family Medicine

## 2015-10-14 ENCOUNTER — Ambulatory Visit (INDEPENDENT_AMBULATORY_CARE_PROVIDER_SITE_OTHER): Payer: BLUE CROSS/BLUE SHIELD | Admitting: Family Medicine

## 2015-10-14 VITALS — BP 139/89 | HR 67 | Temp 98.2°F | Ht 68.0 in | Wt 180.0 lb

## 2015-10-14 DIAGNOSIS — F900 Attention-deficit hyperactivity disorder, predominantly inattentive type: Secondary | ICD-10-CM

## 2015-10-14 DIAGNOSIS — F909 Attention-deficit hyperactivity disorder, unspecified type: Secondary | ICD-10-CM | POA: Insufficient documentation

## 2015-10-14 DIAGNOSIS — Z0000 Encounter for general adult medical examination without abnormal findings: Secondary | ICD-10-CM | POA: Diagnosis not present

## 2015-10-14 MED ORDER — AMPHETAMINE-DEXTROAMPHETAMINE 20 MG PO TABS: 20.0000 mg | ORAL_TABLET | Freq: Two times a day (BID) | ORAL | Status: DC

## 2015-10-14 MED ORDER — ATORVASTATIN CALCIUM 40 MG PO TABS: ORAL_TABLET | ORAL | Status: DC

## 2015-10-14 NOTE — Progress Notes (Signed)
Pre visit review using our clinic review tool, if applicable. No additional management support is needed unless otherwise documented below in the visit note. 

## 2015-10-14 NOTE — Progress Notes (Signed)
   Subjective:    Patient ID: Jordan Robles, male    DOB: 1980-10-07, 35 y.o.   MRN: [id number]  HPI 35 yr old male for a cpx. He feels well and has no concerns. His LDL is well controlled with his medications and diet but the TG remain elevated. His ADHD is well controlled with Adderall.    Review of Systems  Constitutional: Negative.   HENT: Negative.   Eyes: Negative.   Respiratory: Negative.   Cardiovascular: Negative.   Gastrointestinal: Negative.   Genitourinary: Negative.   Musculoskeletal: Negative.   Skin: Negative.   Neurological: Negative.   Psychiatric/Behavioral: Negative.        Objective:   Physical Exam  Constitutional: He is oriented to person, place, and time. He appears well-developed and well-nourished. No distress.  HENT:  Head: Normocephalic and atraumatic.  Right Ear: External ear normal.  Left Ear: External ear normal.  Nose: Nose normal.  Mouth/Throat: Oropharynx is clear and moist. No oropharyngeal exudate.  Eyes: Conjunctivae and EOM are normal. Pupils are equal, round, and reactive to light. Right eye exhibits no discharge. Left eye exhibits no discharge. No scleral icterus.  Neck: Neck supple. No JVD present. No tracheal deviation present. No thyromegaly present.  Cardiovascular: Normal rate, regular rhythm, normal heart sounds and intact distal pulses.  Exam reveals no gallop and no friction rub.   No murmur heard. Pulmonary/Chest: Effort normal and breath sounds normal. No respiratory distress. He has no wheezes. He has no rales. He exhibits no tenderness.  Abdominal: Soft. Bowel sounds are normal. He exhibits no distension and no mass. There is no tenderness. There is no rebound and no guarding.  Genitourinary: Rectum normal, prostate normal and penis normal. Guaiac negative stool. No penile tenderness.  Musculoskeletal: Normal range of motion. He exhibits no edema or tenderness.  Lymphadenopathy:    He has no cervical adenopathy.    Neurological: He is alert and oriented to person, place, and time. He has normal reflexes. No cranial nerve deficit. He exhibits normal muscle tone. Coordination normal.  Skin: Skin is warm and dry. No rash noted. He is not diaphoretic. No erythema. No pallor.  Psychiatric: He has a normal mood and affect. His behavior is normal. Judgment and thought content normal.          Assessment & Plan:  Well exam. We discussed diet and exercise.

## 2015-10-15 ENCOUNTER — Encounter: Payer: Self-pay | Admitting: Family Medicine

## 2015-10-17 MED ORDER — NIACIN ER (ANTIHYPERLIPIDEMIC) 1000 MG PO TBCR: 1000.0000 mg | EXTENDED_RELEASE_TABLET | Freq: Every day | ORAL | Status: DC

## 2015-10-17 NOTE — Telephone Encounter (Signed)
[facility]

## 2015-11-13 ENCOUNTER — Encounter: Payer: Self-pay | Admitting: Family Medicine

## 2015-11-13 DIAGNOSIS — M25511 Pain in right shoulder: Secondary | ICD-10-CM

## 2015-11-14 NOTE — Telephone Encounter (Signed)
I referred him to Dr. Alvan Dame who is a shoulder specialist

## 2015-11-16 ENCOUNTER — Encounter: Payer: Self-pay | Admitting: Family Medicine

## 2015-12-06 ENCOUNTER — Encounter: Payer: Self-pay | Admitting: Family Medicine

## 2015-12-15 ENCOUNTER — Other Ambulatory Visit: Payer: Self-pay | Admitting: Orthopedic Surgery

## 2015-12-15 DIAGNOSIS — M25512 Pain in left shoulder: Secondary | ICD-10-CM

## 2015-12-29 ENCOUNTER — Ambulatory Visit
Admission: RE | Admit: 2015-12-29 | Discharge: 2015-12-29 | Disposition: A | Discharge status: Home / Self Care | Payer: BLUE CROSS/BLUE SHIELD | Source: Ambulatory Visit | Attending: Orthopedic Surgery | Admitting: Orthopedic Surgery

## 2015-12-29 DIAGNOSIS — M25512 Pain in left shoulder: Secondary | ICD-10-CM

## 2015-12-29 MED ORDER — IOHEXOL 180 MG/ML  SOLN
20.0000 mL | Freq: Once | INTRAMUSCULAR | Status: AC | PRN
  Administered 2015-12-29: 20 mL via INTRA_ARTICULAR

## 2016-01-09 ENCOUNTER — Telehealth: Payer: Self-pay | Admitting: Family Medicine

## 2016-01-10 MED ORDER — AMPHETAMINE-DEXTROAMPHETAMINE 20 MG PO TABS: 20.0000 mg | ORAL_TABLET | Freq: Two times a day (BID) | ORAL | Status: DC

## 2016-01-10 NOTE — Telephone Encounter (Signed)
done

## 2016-01-11 NOTE — Telephone Encounter (Signed)
Script is ready for pick up and I sent pt a my chart message.  

## 2016-03-22 ENCOUNTER — Telehealth: Payer: BLUE CROSS/BLUE SHIELD | Admitting: Physician Assistant

## 2016-03-22 DIAGNOSIS — L237 Allergic contact dermatitis due to plants, except food: Secondary | ICD-10-CM

## 2016-03-22 MED ORDER — PREDNISONE 10 MG (21) PO TBPK: 10.0000 mg | ORAL_TABLET | Freq: Every day | ORAL | Status: DC

## 2016-03-22 NOTE — Progress Notes (Signed)

## 2016-03-26 ENCOUNTER — Encounter: Payer: Self-pay | Admitting: Family Medicine

## 2016-03-27 ENCOUNTER — Other Ambulatory Visit: Payer: Self-pay | Admitting: General Practice

## 2016-03-27 MED ORDER — ALUMINUM CHLORIDE 20 % EX SOLN
Freq: Every day | CUTANEOUS | Status: DC
Start: 2016-03-27 — End: 2018-05-15

## 2016-04-08 ENCOUNTER — Other Ambulatory Visit: Payer: Self-pay | Admitting: Family Medicine

## 2016-04-09 MED ORDER — AMPHETAMINE-DEXTROAMPHETAMINE 20 MG PO TABS: 20.0000 mg | ORAL_TABLET | Freq: Two times a day (BID) | ORAL | 0 refills | Status: DC

## 2016-04-09 NOTE — Telephone Encounter (Signed)
done

## 2016-04-09 NOTE — Telephone Encounter (Signed)
Scripts are ready for pick up here at front office and I left a voice message for pt with this information.

## 2016-04-12 ENCOUNTER — Other Ambulatory Visit: Payer: Self-pay | Admitting: Family Medicine

## 2016-07-08 ENCOUNTER — Other Ambulatory Visit: Payer: Self-pay | Admitting: Family Medicine

## 2016-07-12 MED ORDER — AMPHETAMINE-DEXTROAMPHETAMINE 20 MG PO TABS: 20.0000 mg | ORAL_TABLET | Freq: Two times a day (BID) | ORAL | 0 refills | Status: DC

## 2016-07-12 NOTE — Telephone Encounter (Signed)
Pt following up on refill request for amphetamine-dextroamphetamine (ADDERALL) 20 MG tablet  Pt states he is due today.

## 2016-07-12 NOTE — Telephone Encounter (Signed)
done

## 2016-07-12 NOTE — Telephone Encounter (Signed)
Called and left a voicemail letting patient know that Rx's are placed up front ready for pick up.

## 2016-07-24 ENCOUNTER — Telehealth (INDEPENDENT_AMBULATORY_CARE_PROVIDER_SITE_OTHER): Payer: Self-pay | Admitting: Orthopedic Surgery

## 2016-07-24 NOTE — Telephone Encounter (Signed)
Jordan Robles with [employer] called needing Rx refilled (Tramadol) The number to contact her is [phone]   The fax# is [phone]

## 2016-07-24 NOTE — Telephone Encounter (Signed)
Patient last seen in April 2017, see note in [facility].  Please advise.

## 2016-07-24 NOTE — Telephone Encounter (Signed)
Needs rov pls cla thx

## 2016-07-25 NOTE — Telephone Encounter (Signed)
[facility] and [facility] note per Dr Marlou Sa. No refill, needs ROV first

## 2016-09-24 ENCOUNTER — Encounter: Payer: Self-pay | Admitting: Family Medicine

## 2016-10-01 ENCOUNTER — Other Ambulatory Visit (INDEPENDENT_AMBULATORY_CARE_PROVIDER_SITE_OTHER): Payer: BLUE CROSS/BLUE SHIELD

## 2016-10-01 DIAGNOSIS — Z0000 Encounter for general adult medical examination without abnormal findings: Secondary | ICD-10-CM

## 2016-10-01 LAB — BASIC METABOLIC PANEL
BUN: 13 mg/dL (ref 6–23)
CALCIUM: 9.7 mg/dL (ref 8.4–10.5)
CO2: 28 mEq/L (ref 19–32)
CREATININE: 0.87 mg/dL (ref 0.40–1.50)
Chloride: 102 mEq/L (ref 96–112)
GFR: 104.4 mL/min (ref >60.00)
GLUCOSE: 88 mg/dL (ref 70–99)
Potassium: 4.4 mEq/L (ref 3.5–5.1)
SODIUM: 139 meq/L (ref 135–145)

## 2016-10-01 LAB — POC URINALSYSI DIPSTICK (AUTOMATED)
BILIRUBIN UA: NEGATIVE
GLUCOSE UA: NEGATIVE
Ketones, UA: NEGATIVE
LEUKOCYTES UA: NEGATIVE
NITRITE UA: NEGATIVE
Protein, UA: NEGATIVE
RBC UA: NEGATIVE
Spec Grav, UA: 1.015
UROBILINOGEN UA: 0.2
pH, UA: 7

## 2016-10-01 LAB — HEPATIC FUNCTION PANEL
ALBUMIN: 4.7 g/dL (ref 3.5–5.2)
ALK PHOS: 28 U/L — AB (ref 39–117)
ALT: 34 U/L (ref 0–53)
AST: 22 U/L (ref 0–37)
Bilirubin, Direct: 0.2 mg/dL (ref 0.0–0.3)
TOTAL PROTEIN: 6.9 g/dL (ref 6.0–8.3)
Total Bilirubin: 0.9 mg/dL (ref 0.2–1.2)

## 2016-10-01 LAB — TSH: TSH: 3.04 u[IU]/mL (ref 0.35–4.50)

## 2016-10-01 LAB — LIPID PANEL
Cholesterol: 151 mg/dL (ref 0–200)
HDL: 52.2 mg/dL (ref >39.00)
LDL Cholesterol: 61 mg/dL (ref 0–99)
NONHDL: 98.75
TRIGLYCERIDES: 191 mg/dL — AB (ref 0.0–149.0)
Total CHOL/HDL Ratio: 3
VLDL: 38.2 mg/dL (ref 0.0–40.0)

## 2016-10-07 ENCOUNTER — Other Ambulatory Visit: Payer: Self-pay | Admitting: Family Medicine

## 2016-10-11 MED ORDER — AMPHETAMINE-DEXTROAMPHETAMINE 20 MG PO TABS: 20.0000 mg | ORAL_TABLET | Freq: Two times a day (BID) | ORAL | 0 refills | Status: DC

## 2016-10-11 NOTE — Telephone Encounter (Signed)
Done for one month only. He needs an OV soon.

## 2016-10-11 NOTE — Telephone Encounter (Signed)
Script is ready for pick up here at front office and I left a voice message with this information.  

## 2016-10-11 NOTE — Telephone Encounter (Signed)
Pt is out of adderall and would like to pick up today

## 2016-10-12 NOTE — Telephone Encounter (Signed)
Script is ready for pick up here at office and I left a voice message for pt.

## 2016-10-18 ENCOUNTER — Encounter: Payer: Self-pay | Admitting: Family Medicine

## 2016-10-18 ENCOUNTER — Ambulatory Visit (INDEPENDENT_AMBULATORY_CARE_PROVIDER_SITE_OTHER): Payer: BLUE CROSS/BLUE SHIELD | Admitting: Family Medicine

## 2016-10-18 VITALS — BP 138/88 | HR 83 | Temp 98.0°F | Ht 68.0 in | Wt 174.0 lb

## 2016-10-18 DIAGNOSIS — Z0000 Encounter for general adult medical examination without abnormal findings: Secondary | ICD-10-CM | POA: Diagnosis not present

## 2016-10-18 MED ORDER — AMPHETAMINE-DEXTROAMPHETAMINE 20 MG PO TABS: 20.0000 mg | ORAL_TABLET | Freq: Two times a day (BID) | ORAL | 0 refills | Status: DC

## 2016-10-18 MED ORDER — ATORVASTATIN CALCIUM 40 MG PO TABS: ORAL_TABLET | ORAL | 3 refills | Status: DC

## 2016-10-18 MED ORDER — FENOFIBRATE 160 MG PO TABS: 160.0000 mg | ORAL_TABLET | Freq: Every day | ORAL | 3 refills | Status: DC

## 2016-10-18 MED ORDER — NIACIN ER (ANTIHYPERLIPIDEMIC) 1000 MG PO TBCR: 1000.0000 mg | EXTENDED_RELEASE_TABLET | Freq: Every day | ORAL | 3 refills | Status: DC

## 2016-10-18 NOTE — Progress Notes (Signed)
   Subjective:    Patient ID: Jordan Robles, male    DOB: 02/24/1981, 36 y.o.   MRN: [id number]  HPI 36 yr old male for a well exam. He feels fine. He has lost some weight and his lipid levels have improved.    Review of Systems  Constitutional: Negative.   HENT: Negative.   Eyes: Negative.   Respiratory: Negative.   Cardiovascular: Negative.   Gastrointestinal: Negative.   Genitourinary: Negative.   Musculoskeletal: Negative.   Skin: Negative.   Neurological: Negative.   Psychiatric/Behavioral: Negative.        Objective:   Physical Exam  Constitutional: He is oriented to person, place, and time. He appears well-developed and well-nourished. No distress.  HENT:  Head: Normocephalic and atraumatic.  Right Ear: External ear normal.  Left Ear: External ear normal.  Nose: Nose normal.  Mouth/Throat: Oropharynx is clear and moist. No oropharyngeal exudate.  Eyes: Conjunctivae and EOM are normal. Pupils are equal, round, and reactive to light. Right eye exhibits no discharge. Left eye exhibits no discharge. No scleral icterus.  Neck: Neck supple. No JVD present. No tracheal deviation present. No thyromegaly present.  Cardiovascular: Normal rate, regular rhythm, normal heart sounds and intact distal pulses.  Exam reveals no gallop and no friction rub.   No murmur heard. Pulmonary/Chest: Effort normal and breath sounds normal. No respiratory distress. He has no wheezes. He has no rales. He exhibits no tenderness.  Abdominal: Soft. Bowel sounds are normal. He exhibits no distension and no mass. There is no tenderness. There is no rebound and no guarding.  Genitourinary: Rectum normal, prostate normal and penis normal. Rectal exam shows guaiac negative stool. No penile tenderness.  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 He exhibits no edema or tenderness.  Lymphadenopathy:    He has no cervical adenopathy.    Neurological: He is alert and oriented to person, place, and time. He has normal reflexes. No cranial nerve deficit. He exhibits normal muscle tone. Coordination normal.  Skin: Skin is warm and dry. No rash noted. He is not diaphoretic. No erythema. No pallor.  Psychiatric: He has a normal mood and affect. His behavior is normal. Judgment and thought content normal.          Assessment & Plan:  Well exam. We discussed diet and exercise.  Alysia Penna, MD

## 2017-01-02 ENCOUNTER — Telehealth: Payer: Self-pay | Admitting: Family Medicine

## 2017-01-02 MED ORDER — AMPHETAMINE-DEXTROAMPHETAMINE 20 MG PO TABS: 20.0000 mg | ORAL_TABLET | Freq: Two times a day (BID) | ORAL | 0 refills | Status: DC

## 2017-01-02 NOTE — Telephone Encounter (Signed)
Pt need new Rx for Adderall  Pt is aware of 3 business days for refills and someone will call when ready.

## 2017-01-02 NOTE — Telephone Encounter (Signed)
done

## 2017-01-02 NOTE — Telephone Encounter (Signed)
Script is ready for pick up here at front office and I left a voice message for pt with this information.  

## 2017-01-31 ENCOUNTER — Other Ambulatory Visit: Payer: Self-pay | Admitting: Family Medicine

## 2017-02-01 ENCOUNTER — Other Ambulatory Visit: Payer: Self-pay

## 2017-02-01 ENCOUNTER — Encounter: Payer: Self-pay | Admitting: Family Medicine

## 2017-02-01 MED ORDER — NIACIN ER 1000 MG PO TBCR: 1.0000 | EXTENDED_RELEASE_TABLET | Freq: Every day | ORAL | 0 refills | Status: DC

## 2017-02-01 NOTE — Telephone Encounter (Signed)
Yes using OTC would be fine

## 2017-02-01 NOTE — Telephone Encounter (Signed)
Dr. Fry - Please advise. Thanks! 

## 2017-04-04 ENCOUNTER — Telehealth: Payer: Self-pay | Admitting: Family Medicine

## 2017-04-04 MED ORDER — AMPHETAMINE-DEXTROAMPHETAMINE 20 MG PO TABS: 20.0000 mg | ORAL_TABLET | Freq: Two times a day (BID) | ORAL | 0 refills | Status: DC

## 2017-04-04 NOTE — Telephone Encounter (Signed)
done

## 2017-04-04 NOTE — Telephone Encounter (Signed)
Pt needs new rx generic adderall 20 mg °

## 2017-04-05 NOTE — Telephone Encounter (Signed)
Script is ready for pick up here at front office and I spoke with pt.  

## 2017-05-11 ENCOUNTER — Other Ambulatory Visit: Payer: Self-pay | Admitting: Family Medicine

## 2017-06-06 ENCOUNTER — Encounter: Payer: Self-pay | Admitting: Family Medicine

## 2017-07-07 ENCOUNTER — Other Ambulatory Visit: Payer: Self-pay | Admitting: Family Medicine

## 2017-07-08 ENCOUNTER — Telehealth: Payer: Self-pay

## 2017-07-08 MED ORDER — AMPHETAMINE-DEXTROAMPHETAMINE 20 MG PO TABS: 20.0000 mg | ORAL_TABLET | Freq: Two times a day (BID) | ORAL | 0 refills | Status: DC

## 2017-07-08 NOTE — Telephone Encounter (Signed)
Rx was signed and is ready for pick up at the front desk, pt is aware.

## 2017-07-08 NOTE — Telephone Encounter (Signed)
Done

## 2017-07-08 NOTE — Telephone Encounter (Signed)
Rx for Adderall has been signed and is ready for pick up at the front desk. Pt is aware.

## 2017-07-31 ENCOUNTER — Ambulatory Visit: Payer: BC Managed Care – PPO | Admitting: Family Medicine

## 2017-07-31 ENCOUNTER — Encounter: Payer: Self-pay | Admitting: Family Medicine

## 2017-07-31 VITALS — BP 118/70 | HR 93 | Temp 98.3°F | Wt 170.6 lb

## 2017-07-31 DIAGNOSIS — A630 Anogenital (venereal) warts: Secondary | ICD-10-CM | POA: Diagnosis not present

## 2017-07-31 DIAGNOSIS — K644 Residual hemorrhoidal skin tags: Secondary | ICD-10-CM

## 2017-07-31 NOTE — Progress Notes (Signed)
   Subjective:    Patient ID: Jordan Robles, male    DOB: 01-05-1981, 36 y.o.   MRN: [id number]  HPI Here to remove some skin lesions. He has one on the penis, 3 on the left neck, and one on the left buttock. They all get irritated by rubbing against clothing.    Review of Systems  Constitutional: Negative.   Respiratory: Negative.   Cardiovascular: Negative.        Objective:   Physical Exam  Constitutional: He appears well-developed and well-nourished.  Cardiovascular: Normal rate, regular rhythm, normal heart sounds and intact distal pulses.  Pulmonary/Chest: Effort normal and breath sounds normal. No respiratory distress. He has no wheezes. He has no rales.  Skin:  There is one genital wart on the shaft of the penis. There are 3 skin tags on the left neck, and there is one skin tag on the left buttock.           Assessment & Plan:  The penile wart was treated with 2 cycles of cryotherapy. All 4 skin tags were excised with scissors. Dress with Neosporin and Bandaids. Alysia Penna, MD

## 2017-08-28 ENCOUNTER — Encounter: Payer: Self-pay | Admitting: Family Medicine

## 2017-08-30 ENCOUNTER — Encounter: Payer: Self-pay | Admitting: Family Medicine

## 2017-09-02 NOTE — Telephone Encounter (Signed)
Please contact the business office and tell them to only charge [id number] for this visit (do NOT charge the 11200)

## 2017-09-03 NOTE — Telephone Encounter (Signed)
Tell him we are looking into this

## 2017-09-20 ENCOUNTER — Telehealth: Payer: Self-pay | Admitting: *Deleted

## 2017-09-20 NOTE — Telephone Encounter (Signed)
Faxed letter was received from [employer] stating the medication is covered from 09/20/2017-09/20/2020 as long as he remains employed by the [employer] [facility] [employer] [employer].  Letter faxed to [facility] at [phone].

## 2017-09-20 NOTE — Telephone Encounter (Signed)
Prior auth for Adderall 20mg  sent to [email or web address].

## 2017-10-01 ENCOUNTER — Other Ambulatory Visit: Payer: Self-pay | Admitting: Family Medicine

## 2017-10-04 ENCOUNTER — Encounter: Payer: Self-pay | Admitting: Family Medicine

## 2017-10-04 MED ORDER — AMPHETAMINE-DEXTROAMPHETAMINE 20 MG PO TABS: 20.0000 mg | ORAL_TABLET | Freq: Two times a day (BID) | ORAL | 0 refills | Status: DC

## 2017-10-04 NOTE — Telephone Encounter (Signed)
These were sent in  

## 2017-10-04 NOTE — Telephone Encounter (Signed)
I already sent in a 3 month supply

## 2017-10-05 ENCOUNTER — Other Ambulatory Visit (INDEPENDENT_AMBULATORY_CARE_PROVIDER_SITE_OTHER): Payer: Self-pay | Admitting: Orthopedic Surgery

## 2017-10-07 NOTE — Telephone Encounter (Signed)
Patient not seen since 2017. Please advise. Thanks.

## 2017-10-07 NOTE — Telephone Encounter (Signed)
Need rov 

## 2017-10-18 ENCOUNTER — Encounter: Payer: Self-pay | Admitting: Family Medicine

## 2017-10-18 ENCOUNTER — Ambulatory Visit (INDEPENDENT_AMBULATORY_CARE_PROVIDER_SITE_OTHER): Payer: BC Managed Care – PPO | Admitting: Family Medicine

## 2017-10-18 VITALS — BP 108/68 | HR 79 | Temp 97.4°F | Ht 68.25 in | Wt 174.4 lb

## 2017-10-18 DIAGNOSIS — Z0000 Encounter for general adult medical examination without abnormal findings: Secondary | ICD-10-CM

## 2017-10-18 LAB — POC URINALSYSI DIPSTICK (AUTOMATED)
Bilirubin, UA: NEGATIVE
Blood, UA: NEGATIVE
Glucose, UA: NEGATIVE
KETONES UA: NEGATIVE
Leukocytes, UA: NEGATIVE
Nitrite, UA: NEGATIVE
PROTEIN UA: NEGATIVE
SPEC GRAV UA: 1.02 (ref 1.010–1.025)
Urobilinogen, UA: 0.2 E.U./dL
pH, UA: 6.5 (ref 5.0–8.0)

## 2017-10-18 LAB — LIPID PANEL
CHOLESTEROL: 128 mg/dL (ref 0–200)
HDL: 48.8 mg/dL (ref >39.00)
NonHDL: 79.34
TRIGLYCERIDES: 225 mg/dL — AB (ref 0.0–149.0)
Total CHOL/HDL Ratio: 3
VLDL: 45 mg/dL — ABNORMAL HIGH (ref 0.0–40.0)

## 2017-10-18 LAB — HEPATIC FUNCTION PANEL
ALT: 31 U/L (ref 0–53)
AST: 22 U/L (ref 0–37)
Albumin: 4.8 g/dL (ref 3.5–5.2)
Alkaline Phosphatase: 33 U/L — ABNORMAL LOW (ref 39–117)
Bilirubin, Direct: 0.2 mg/dL (ref 0.0–0.3)
TOTAL PROTEIN: 7.2 g/dL (ref 6.0–8.3)
Total Bilirubin: 1.1 mg/dL (ref 0.2–1.2)

## 2017-10-18 LAB — CBC WITH DIFFERENTIAL/PLATELET
BASOS PCT: 1.1 % (ref 0.0–3.0)
Basophils Absolute: 0.1 10*3/uL (ref 0.0–0.1)
EOS PCT: 2.3 % (ref 0.0–5.0)
Eosinophils Absolute: 0.2 10*3/uL (ref 0.0–0.7)
HCT: 47.1 % (ref 39.0–52.0)
Hemoglobin: 15.9 g/dL (ref 13.0–17.0)
LYMPHS ABS: 1.9 10*3/uL (ref 0.7–4.0)
Lymphocytes Relative: 28.2 % (ref 12.0–46.0)
MCHC: 33.7 g/dL (ref 30.0–36.0)
MCV: 95 fl (ref 78.0–100.0)
MONO ABS: 0.7 10*3/uL (ref 0.1–1.0)
Monocytes Relative: 10 % (ref 3.0–12.0)
NEUTROS ABS: 3.9 10*3/uL (ref 1.4–7.7)
NEUTROS PCT: 58.4 % (ref 43.0–77.0)
PLATELETS: 330 10*3/uL (ref 150.0–400.0)
RBC: 4.96 Mil/uL (ref 4.22–5.81)
RDW: 13.2 % (ref 11.5–15.5)
WBC: 6.6 10*3/uL (ref 4.0–10.5)

## 2017-10-18 LAB — BASIC METABOLIC PANEL
BUN: 12 mg/dL (ref 6–23)
CHLORIDE: 102 meq/L (ref 96–112)
CO2: 29 meq/L (ref 19–32)
CREATININE: 0.93 mg/dL (ref 0.40–1.50)
Calcium: 9.7 mg/dL (ref 8.4–10.5)
GFR: 96.14 mL/min (ref >60.00)
Glucose, Bld: 90 mg/dL (ref 70–99)
Potassium: 4.4 mEq/L (ref 3.5–5.1)
Sodium: 140 mEq/L (ref 135–145)

## 2017-10-18 LAB — LDL CHOLESTEROL, DIRECT: LDL DIRECT: 61 mg/dL

## 2017-10-18 LAB — TSH: TSH: 2.12 u[IU]/mL (ref 0.35–4.50)

## 2017-10-18 MED ORDER — ATORVASTATIN CALCIUM 40 MG PO TABS: ORAL_TABLET | ORAL | 3 refills | Status: DC

## 2017-10-18 MED ORDER — NIACIN ER (ANTIHYPERLIPIDEMIC) 1000 MG PO TBCR: 1000.0000 mg | EXTENDED_RELEASE_TABLET | Freq: Every day | ORAL | 3 refills | Status: DC

## 2017-10-18 MED ORDER — FENOFIBRATE 160 MG PO TABS
160.0000 mg | ORAL_TABLET | Freq: Every day | ORAL | 3 refills | Status: DC
Start: 2017-10-18 — End: 2018-10-21

## 2017-10-18 NOTE — Progress Notes (Signed)
   Subjective:    Patient ID: Jordan Robles, male    DOB: [birth year], 37 y.o.   MRN: [id number]  HPI Here for a well exam. He feels great.    Review of Systems  Constitutional: Negative.   HENT: Negative.   Eyes: Negative.   Respiratory: Negative.   Cardiovascular: Negative.   Gastrointestinal: Negative.   Genitourinary: Negative.   Musculoskeletal: Negative.   Skin: Negative.   Neurological: Negative.   Psychiatric/Behavioral: Negative.        Objective:   Physical Exam  Constitutional: He is oriented to person, place, and time. He appears well-developed and well-nourished. No distress.  HENT:  Head: Normocephalic and atraumatic.  Right Ear: External ear normal.  Left Ear: External ear normal.  Nose: Nose normal.  Mouth/Throat: Oropharynx is clear and moist. No oropharyngeal exudate.  Eyes: Conjunctivae and EOM are normal. Pupils are equal, round, and reactive to light. Right eye exhibits no discharge. Left eye exhibits no discharge. No scleral icterus.  Neck: Neck supple. No JVD present. No tracheal deviation present. No thyromegaly present.  Cardiovascular: Normal rate, regular rhythm, normal heart sounds and intact distal pulses. Exam reveals no gallop and no friction rub.  No murmur heard. Pulmonary/Chest: Effort normal and breath sounds normal. No respiratory distress. He has no wheezes. He has no rales. He exhibits no tenderness.  Abdominal: Soft. Bowel sounds are normal. He exhibits no distension and no mass. There is no tenderness. There is no rebound and no guarding.  Genitourinary: Rectum normal, prostate normal and penis normal. Rectal exam shows guaiac negative stool. No penile tenderness.  Musculoskeletal: Normal range of motion. He exhibits no edema or tenderness.  Lymphadenopathy:    He has no cervical adenopathy.  Neurological: He is alert and oriented to person, place, and time. He has normal reflexes. No cranial nerve deficit. He exhibits normal muscle  tone. Coordination normal.  Skin: Skin is warm and dry. No rash noted. He is not diaphoretic. No erythema. No pallor.  Psychiatric: He has a normal mood and affect. His behavior is normal. Judgment and thought content normal.          Assessment & Plan:  Well exam. We discussed diet and exercise. Get fasting labs.  Alysia Penna, MD

## 2017-11-25 IMAGING — XA DG FLUORO GUIDE NDL PLC/BX
1 series · 1 of 1 positions shown · non-contrast
Comparison: none

CLINICAL DATA: Left shoulder pain and dislocations.

[Series 1: ortho standard · 1 of 1 slices shown]
[im 1/1]
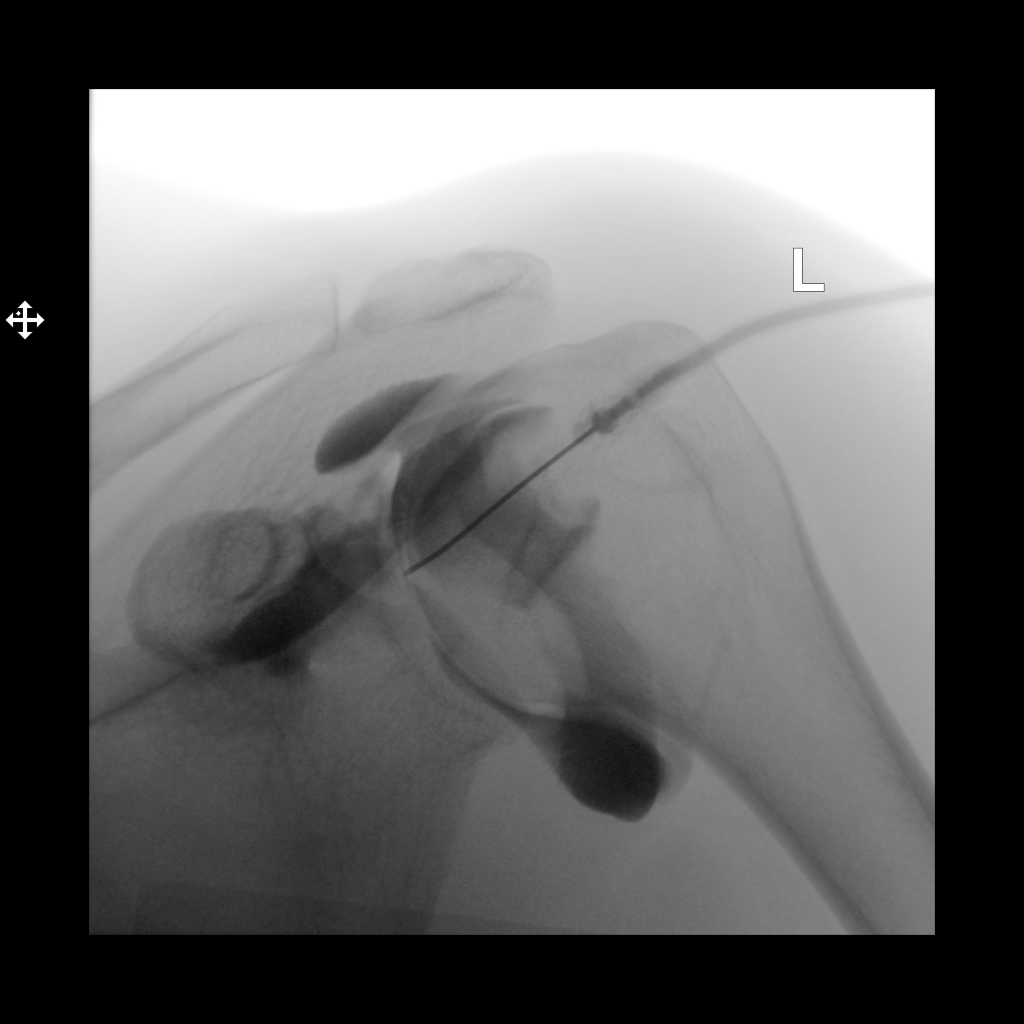

[1 of 1 positions shown; findings below may reference images not displayed]

FLUOROSCOPY TIME:  35.93 uGy*m2

PROCEDURE:
Left SHOULDER INJECTION UNDER FLUOROSCOPY

An appropriate skin entrance site was determined. The site was
marked, prepped with Betadine, draped in the usual sterile fashion,
and infiltrated locally with buffered Lidocaine. 22 gauge spinal
needle was advanced to the superomedial margin of the humeral head
under intermittent fluoroscopy. 1 ml of Lidocaine injected easily. A
mixture of 0.1 ml Multihance and 20 ml of dilute Isovue 200 M was
then used to opacify the left shoulder capsule. No immediate
complication.
IMPRESSION: Technically successful left shoulder injection for MRI.

## 2018-01-03 ENCOUNTER — Other Ambulatory Visit: Payer: Self-pay | Admitting: Family Medicine

## 2018-01-06 ENCOUNTER — Encounter: Payer: Self-pay | Admitting: Family Medicine

## 2018-01-06 MED ORDER — AMPHETAMINE-DEXTROAMPHETAMINE 20 MG PO TABS: 20.0000 mg | ORAL_TABLET | Freq: Two times a day (BID) | ORAL | 0 refills | Status: DC

## 2018-01-06 NOTE — Telephone Encounter (Signed)
Done

## 2018-01-06 NOTE — Telephone Encounter (Signed)
Last OV: 10/18/2017 Last filled: 10/04/2017, #60, 3 rxs Sig: Take 1 tablet (20 mg total) by mouth 2 (two) times daily.  Patient would like refills sent electronically.

## 2018-04-05 ENCOUNTER — Other Ambulatory Visit: Payer: Self-pay | Admitting: Family Medicine

## 2018-04-07 NOTE — Telephone Encounter (Signed)
Last OV 10/18/2017   Last refilled 01/06/2018 disp 60 with no refills   PCP is out of office will NOT be back until Thursday will need to contact pt to advice

## 2018-04-07 NOTE — Telephone Encounter (Signed)
Pt is fine to wait until PCP is back in the office on Thursday.   Sent to PCP to advise

## 2018-04-08 ENCOUNTER — Encounter: Payer: Self-pay | Admitting: Family Medicine

## 2018-04-10 MED ORDER — AMPHETAMINE-DEXTROAMPHETAMINE 20 MG PO TABS: 20.0000 mg | ORAL_TABLET | Freq: Two times a day (BID) | ORAL | 0 refills | Status: DC

## 2018-04-10 NOTE — Telephone Encounter (Signed)
Done

## 2018-05-13 ENCOUNTER — Encounter: Payer: Self-pay | Admitting: Family Medicine

## 2018-05-13 NOTE — Telephone Encounter (Signed)
Call in Temazepam 30 mg to use prn sleep #30 with 2 rf

## 2018-05-14 MED ORDER — TEMAZEPAM 30 MG PO CAPS: 30.0000 mg | ORAL_CAPSULE | Freq: Every evening | ORAL | 2 refills | Status: DC | PRN

## 2018-05-15 ENCOUNTER — Other Ambulatory Visit: Payer: Self-pay | Admitting: Family Medicine

## 2018-05-15 ENCOUNTER — Encounter: Payer: Self-pay | Admitting: Family Medicine

## 2018-07-07 ENCOUNTER — Other Ambulatory Visit: Payer: Self-pay | Admitting: Family Medicine

## 2018-07-07 NOTE — Telephone Encounter (Signed)
Dr. Fry please advise on refill. Thanks  

## 2018-07-08 ENCOUNTER — Encounter: Payer: Self-pay | Admitting: Family Medicine

## 2018-07-08 MED ORDER — AMPHETAMINE-DEXTROAMPHETAMINE 20 MG PO TABS: 20.0000 mg | ORAL_TABLET | Freq: Two times a day (BID) | ORAL | 0 refills | Status: DC

## 2018-07-08 NOTE — Telephone Encounter (Signed)
Dr. Fry please advise on refill. thanks 

## 2018-07-08 NOTE — Telephone Encounter (Signed)
Tell him I have sent in the refills

## 2018-07-08 NOTE — Telephone Encounter (Signed)
Done

## 2018-10-02 ENCOUNTER — Other Ambulatory Visit: Payer: Self-pay | Admitting: Family Medicine

## 2018-10-02 NOTE — Telephone Encounter (Signed)
Dr. Fry please advise on refill. Thanks  

## 2018-10-06 MED ORDER — AMPHETAMINE-DEXTROAMPHETAMINE 20 MG PO TABS: 20.0000 mg | ORAL_TABLET | Freq: Two times a day (BID) | ORAL | 0 refills | Status: DC

## 2018-10-06 NOTE — Telephone Encounter (Signed)
Done

## 2018-10-21 ENCOUNTER — Ambulatory Visit (INDEPENDENT_AMBULATORY_CARE_PROVIDER_SITE_OTHER): Payer: BC Managed Care – PPO | Admitting: Family Medicine

## 2018-10-21 ENCOUNTER — Encounter: Payer: Self-pay | Admitting: Family Medicine

## 2018-10-21 VITALS — BP 128/72 | HR 70 | Temp 97.7°F | Ht 68.0 in | Wt 166.1 lb

## 2018-10-21 DIAGNOSIS — Z0000 Encounter for general adult medical examination without abnormal findings: Secondary | ICD-10-CM | POA: Diagnosis not present

## 2018-10-21 LAB — BASIC METABOLIC PANEL
BUN: 10 mg/dL (ref 6–23)
CALCIUM: 9.8 mg/dL (ref 8.4–10.5)
CO2: 30 mEq/L (ref 19–32)
Chloride: 103 mEq/L (ref 96–112)
Creatinine, Ser: 0.85 mg/dL (ref 0.40–1.50)
GFR: 99.83 mL/min (ref >60.00)
Glucose, Bld: 93 mg/dL (ref 70–99)
Potassium: 4.5 mEq/L (ref 3.5–5.1)
Sodium: 140 mEq/L (ref 135–145)

## 2018-10-21 LAB — CBC WITH DIFFERENTIAL/PLATELET
Basophils Absolute: 0.1 10*3/uL (ref 0.0–0.1)
Basophils Relative: 0.6 % (ref 0.0–3.0)
EOS ABS: 0.1 10*3/uL (ref 0.0–0.7)
Eosinophils Relative: 1.1 % (ref 0.0–5.0)
HCT: 46.2 % (ref 39.0–52.0)
Hemoglobin: 15.9 g/dL (ref 13.0–17.0)
Lymphocytes Relative: 18.2 % (ref 12.0–46.0)
Lymphs Abs: 2 10*3/uL (ref 0.7–4.0)
MCHC: 34.5 g/dL (ref 30.0–36.0)
MCV: 94.1 fl (ref 78.0–100.0)
Monocytes Absolute: 0.8 10*3/uL (ref 0.1–1.0)
Monocytes Relative: 7.7 % (ref 3.0–12.0)
Neutro Abs: 7.9 10*3/uL — ABNORMAL HIGH (ref 1.4–7.7)
Neutrophils Relative %: 72.4 % (ref 43.0–77.0)
Platelets: 381 10*3/uL (ref 150.0–400.0)
RBC: 4.91 Mil/uL (ref 4.22–5.81)
RDW: 13 % (ref 11.5–15.5)
WBC: 10.9 10*3/uL — ABNORMAL HIGH (ref 4.0–10.5)

## 2018-10-21 LAB — HEPATIC FUNCTION PANEL
ALK PHOS: 34 U/L — AB (ref 39–117)
ALT: 34 U/L (ref 0–53)
AST: 20 U/L (ref 0–37)
Albumin: 4.6 g/dL (ref 3.5–5.2)
Bilirubin, Direct: 0.2 mg/dL (ref 0.0–0.3)
Total Bilirubin: 0.7 mg/dL (ref 0.2–1.2)
Total Protein: 6.8 g/dL (ref 6.0–8.3)

## 2018-10-21 LAB — POC URINALSYSI DIPSTICK (AUTOMATED)
Bilirubin, UA: NEGATIVE
Blood, UA: NEGATIVE
Glucose, UA: NEGATIVE
Ketones, UA: NEGATIVE
Leukocytes, UA: NEGATIVE
Nitrite, UA: NEGATIVE
Protein, UA: NEGATIVE
Spec Grav, UA: 1.01 (ref 1.010–1.025)
UROBILINOGEN UA: 0.2 U/dL
pH, UA: 7 (ref 5.0–8.0)

## 2018-10-21 LAB — LIPID PANEL
Cholesterol: 134 mg/dL (ref 0–200)
HDL: 39 mg/dL — ABNORMAL LOW (ref >39.00)
NonHDL: 94.89
Total CHOL/HDL Ratio: 3
Triglycerides: 244 mg/dL — ABNORMAL HIGH (ref 0.0–149.0)
VLDL: 48.8 mg/dL — ABNORMAL HIGH (ref 0.0–40.0)

## 2018-10-21 LAB — LDL CHOLESTEROL, DIRECT: LDL DIRECT: 63 mg/dL

## 2018-10-21 LAB — TSH: TSH: 2.28 u[IU]/mL (ref 0.35–4.50)

## 2018-10-21 MED ORDER — FENOFIBRATE 160 MG PO TABS: 160.0000 mg | ORAL_TABLET | Freq: Every day | ORAL | 3 refills | Status: DC

## 2018-10-21 MED ORDER — ATORVASTATIN CALCIUM 40 MG PO TABS: ORAL_TABLET | ORAL | 3 refills | Status: DC

## 2018-10-21 MED ORDER — NIACIN ER (ANTIHYPERLIPIDEMIC) 1000 MG PO TBCR: 1000.0000 mg | EXTENDED_RELEASE_TABLET | Freq: Every day | ORAL | 3 refills | Status: DC

## 2018-10-21 NOTE — Progress Notes (Signed)
   Subjective:    Patient ID: Jordan Robles, male    DOB: 1981-08-30, 38 y.o.   MRN: [id number]  HPI Here for a well exam. He feels great.    Review of Systems  Constitutional: Negative.   HENT: Negative.   Eyes: Negative.   Respiratory: Negative.   Cardiovascular: Negative.   Gastrointestinal: Negative.   Genitourinary: Negative.   Musculoskeletal: Negative.   Skin: Negative.   Neurological: Negative.   Psychiatric/Behavioral: Negative.        Objective:   Physical Exam Constitutional:      General: He is not in acute distress.    Appearance: He is well-developed. He is not diaphoretic.  HENT:     Head: Normocephalic and atraumatic.     Right Ear: External ear normal.     Left Ear: External ear normal.     Nose: Nose normal.     Mouth/Throat:     Pharynx: No oropharyngeal exudate.  Eyes:     General: No scleral icterus.       Right eye: No discharge.        Left eye: No discharge.     Conjunctiva/sclera: Conjunctivae normal.     Pupils: Pupils are equal, round, and reactive to light.  Neck:     Musculoskeletal: Neck supple.     Thyroid: No thyromegaly.     Vascular: No JVD.     Trachea: No tracheal deviation.  Cardiovascular:     Rate and Rhythm: Normal rate and regular rhythm.     Heart sounds: Normal heart sounds. No murmur. No friction rub. No gallop.   Pulmonary:     Effort: Pulmonary effort is normal. No respiratory distress.     Breath sounds: Normal breath sounds. No wheezing or rales.  Chest:     Chest wall: No tenderness.  Abdominal:     General: Bowel sounds are normal. There is no distension.     Palpations: Abdomen is soft. There is no mass.     Tenderness: There is no abdominal tenderness. There is no guarding or rebound.  Genitourinary:    Penis: Normal. No tenderness.      Comments: Right testicle normal, left is surgically absent  Musculoskeletal: Normal range of motion.        General: No tenderness.  Lymphadenopathy:     Cervical: No  cervical adenopathy.  Skin:    General: Skin is warm and dry.     Coloration: Skin is not pale.     Findings: No erythema or rash.  Neurological:     Mental Status: He is alert and oriented to person, place, and time.     Cranial Nerves: No cranial nerve deficit.     Motor: No abnormal muscle tone.     Coordination: Coordination normal.     Deep Tendon Reflexes: Reflexes are normal and symmetric. Reflexes normal.  Psychiatric:        Behavior: Behavior normal.        Thought Content: Thought content normal.        Judgment: Judgment normal.           Assessment & Plan:  Well exam. We discussed diet and exercise. Get fasting labs.  Alysia Penna, MD

## 2018-12-30 ENCOUNTER — Encounter: Payer: Self-pay | Admitting: Family Medicine

## 2018-12-30 ENCOUNTER — Other Ambulatory Visit: Payer: Self-pay | Admitting: Family Medicine

## 2018-12-31 MED ORDER — AMPHETAMINE-DEXTROAMPHETAMINE 20 MG PO TABS: 20.0000 mg | ORAL_TABLET | Freq: Two times a day (BID) | ORAL | 0 refills | Status: DC

## 2018-12-31 NOTE — Telephone Encounter (Signed)
Done

## 2018-12-31 NOTE — Telephone Encounter (Signed)
Dr. Sarajane Jews please advise on refill of adderall. Thanks

## 2019-01-26 ENCOUNTER — Other Ambulatory Visit: Payer: Self-pay | Admitting: Family Medicine

## 2019-01-28 NOTE — Telephone Encounter (Signed)
Call in #30 with 5 rf 

## 2019-01-28 NOTE — Telephone Encounter (Signed)
Dr. Fry please advise. Thanks  

## 2019-01-29 NOTE — Telephone Encounter (Signed)
Refill called to the pharmacy and left on the VM 

## 2019-03-30 ENCOUNTER — Other Ambulatory Visit: Payer: Self-pay | Admitting: Family Medicine

## 2019-03-30 NOTE — Telephone Encounter (Signed)
Please advise 

## 2019-03-31 MED ORDER — AMPHETAMINE-DEXTROAMPHETAMINE 20 MG PO TABS: 20.0000 mg | ORAL_TABLET | Freq: Two times a day (BID) | ORAL | 0 refills | Status: DC

## 2019-03-31 NOTE — Telephone Encounter (Signed)
Done

## 2019-07-01 ENCOUNTER — Other Ambulatory Visit: Payer: Self-pay | Admitting: Family Medicine

## 2019-07-02 ENCOUNTER — Telehealth: Payer: BC Managed Care – PPO | Admitting: Physician Assistant

## 2019-07-02 DIAGNOSIS — J310 Chronic rhinitis: Secondary | ICD-10-CM

## 2019-07-02 MED ORDER — FLUTICASONE PROPIONATE 50 MCG/ACT NA SUSP: 2.0000 | Freq: Every day | NASAL | 0 refills | Status: DC

## 2019-07-02 MED ORDER — LEVOCETIRIZINE DIHYDROCHLORIDE 5 MG PO TABS: 5.0000 mg | ORAL_TABLET | Freq: Every evening | ORAL | 0 refills | Status: DC

## 2019-07-02 NOTE — Telephone Encounter (Signed)
Okay for refill? Please advise 

## 2019-07-02 NOTE — Progress Notes (Signed)
I have spent 5 minutes in review of e-visit questionnaire, review and updating patient chart, medical decision making and response to patient.   Markia Kyer Cody Jenisha Faison, PA-C    

## 2019-07-02 NOTE — Progress Notes (Signed)
E visit for Allergic Rhinitis We are sorry that you are not feeling well.  Here is how we plan to help!  Based on what you have shared with me it looks like you have Allergic Rhinitis.  Rhinitis is when a reaction occurs that causes nasal congestion, runny nose, sneezing, and itching.  Most types of rhinitis are caused by an inflammation and are associated with symptoms in the eyes ears or throat. There are several types of rhinitis.  The most common are acute rhinitis, which is usually caused by a viral illness, allergic or seasonal rhinitis, and nonallergic or year-round rhinitis.  Nasal allergies occur certain times of the year.  Allergic rhinitis is caused when allergens in the air trigger the release of histamine in the body.  Histamine causes itching, swelling, and fluid to build up in the fragile linings of the nasal passages, sinuses and eyelids.  An itchy nose and clear discharge are common.  I recommend the following over the counter treatments: You should take a daily dose of antihistamine and Xyzal 5 mg take 1 tablet daily - I have sent in a one-time prescription for this. Further refills will need to come from your primary care provider.  I also would recommend a nasal spray: Flonase 2 sprays into each nostril once daily - I have sent in a one-time prescription for this. Further refills will need to come from your primary care provider.   You may also benefit from eye drops such as: Systane 1-2 driops each eye twice daily as needed  HOME CARE:   You can use an over-the-counter saline nasal spray as needed  Avoid areas where there is heavy dust, mites, or molds  Stay indoors on windy days during the pollen season  Keep windows closed in home, at least in bedroom; use air conditioner.  Use high-efficiency house air filter  Keep windows closed in car, turn AC on re-circulate  Avoid playing out with dog during pollen season  GET HELP RIGHT AWAY IF:   If your symptoms do  not improve within 10 days  You become short of breath  You develop yellow or green discharge from your nose for over 3 days  You have coughing fits  MAKE SURE YOU:   Understand these instructions  Will watch your condition  Will get help right away if you are not doing well or get worse  Thank you for choosing an e-visit. Your e-visit answers were reviewed by a board certified advanced clinical practitioner to complete your personal care plan. Depending upon the condition, your plan could have included both over the counter or prescription medications. Please review your pharmacy choice. Be sure that the pharmacy you have chosen is open so that you can pick up your prescription now.  If there is a problem you may message your provider in [place] to have the prescription routed to another pharmacy. Your safety is important to [place]. If you have drug allergies check your prescription carefully.  For the next 24 hours, you can use MyChart to ask questions about today's visit, request a non-urgent call back, or ask for a work or school excuse from your e-visit provider. You will get an email in the next two days asking about your experience. I hope that your e-visit has been valuable and will speed your recovery.

## 2019-07-03 ENCOUNTER — Encounter: Payer: Self-pay | Admitting: Family Medicine

## 2019-07-03 ENCOUNTER — Telehealth: Payer: Self-pay | Admitting: Family Medicine

## 2019-07-03 NOTE — Telephone Encounter (Signed)
Requested medication (s) are due for refill today: yes  Requested medication (s) are on the active medication list: yes  Last refill:  06/05/19  Future visit scheduled: no  Notes to clinic:  Refill cannot be delegated    Requested Prescriptions  Pending Prescriptions Disp Refills   amphetamine-dextroamphetamine (ADDERALL) 20 MG tablet 60 tablet 0    Sig: Take 1 tablet (20 mg total) by mouth 2 (two) times daily.     Not Delegated - Psychiatry:  Stimulants/ADHD Failed - 07/03/2019 11:34 AM      Failed - This refill cannot be delegated      Failed - Urine Drug Screen completed in last 360 days.      Failed - Valid encounter within last 3 months    Recent Outpatient Visits          8 months ago Preventative health care   [facility] at [place], Ishmael Holter, MD   1 year ago Preventative health care   [place] at [place], Ishmael Holter, MD   1 year ago [place] at [place], Ishmael Holter, MD   2 years ago Preventative health care   [place] at [place], Ishmael Holter, MD   3 years ago Preventative health care   [place] at [place], Ishmael Holter, MD

## 2019-07-03 NOTE — Telephone Encounter (Signed)
amphetamine-dextroamphetamine (ADDERALL) 20 MG tablet   Send to [facility]

## 2019-07-06 ENCOUNTER — Other Ambulatory Visit: Payer: Self-pay | Admitting: Family Medicine

## 2019-07-06 MED ORDER — AMPHETAMINE-DEXTROAMPHETAMINE 20 MG PO TABS: 20.0000 mg | ORAL_TABLET | Freq: Two times a day (BID) | ORAL | 0 refills | Status: DC

## 2019-07-06 NOTE — Telephone Encounter (Signed)
Okay for refill pt last ov 10/2018. Last refill 05/19/2019- 07/05/2019.

## 2019-07-06 NOTE — Telephone Encounter (Signed)
Please see rx refill request  °

## 2019-07-27 ENCOUNTER — Other Ambulatory Visit: Payer: Self-pay | Admitting: Physician Assistant

## 2019-07-27 DIAGNOSIS — J310 Chronic rhinitis: Secondary | ICD-10-CM

## 2019-07-27 MED ORDER — LEVOCETIRIZINE DIHYDROCHLORIDE 5 MG PO TABS: 5.0000 mg | ORAL_TABLET | Freq: Every evening | ORAL | 11 refills | Status: DC

## 2019-07-27 MED ORDER — FLUTICASONE PROPIONATE 50 MCG/ACT NA SUSP: 2.0000 | Freq: Every day | NASAL | 11 refills | Status: DC

## 2019-07-27 NOTE — Telephone Encounter (Signed)
Please advise on refill request

## 2019-08-03 ENCOUNTER — Other Ambulatory Visit: Payer: Self-pay | Admitting: Family Medicine

## 2019-08-03 NOTE — Telephone Encounter (Signed)
Okay for refill?  

## 2019-08-04 MED ORDER — AMPHETAMINE-DEXTROAMPHETAMINE 20 MG PO TABS: 20.0000 mg | ORAL_TABLET | Freq: Two times a day (BID) | ORAL | 0 refills | Status: DC

## 2019-08-04 NOTE — Telephone Encounter (Signed)
Done

## 2019-09-30 ENCOUNTER — Encounter: Payer: Self-pay | Admitting: Family Medicine

## 2019-09-30 NOTE — Telephone Encounter (Signed)
adderall was filled 10/04/2019 Nicotine patches are not on the current med list

## 2019-10-01 NOTE — Telephone Encounter (Signed)
Notes. When I get the request for the patches, I will take care of it.

## 2019-10-01 NOTE — Telephone Encounter (Signed)
Insurance will not pay for nicotine patches since they are all OTC now. He has refills available for Adderall until 11-03-19

## 2019-10-09 MED ORDER — NICOTINE 21 MG/24HR TD PT24: 21.0000 mg | MEDICATED_PATCH | Freq: Every day | TRANSDERMAL | 5 refills | Status: DC

## 2019-10-09 NOTE — Telephone Encounter (Signed)
I sent this in

## 2019-10-14 ENCOUNTER — Other Ambulatory Visit: Payer: Self-pay | Admitting: Family Medicine

## 2019-10-28 ENCOUNTER — Encounter: Payer: Self-pay | Admitting: Family Medicine

## 2019-10-30 MED ORDER — NICOTINE 14 MG/24HR TD PT24: 14.0000 mg | MEDICATED_PATCH | Freq: Every day | TRANSDERMAL | 2 refills | Status: DC

## 2019-10-30 NOTE — Telephone Encounter (Signed)
I sent in the 14 mg size

## 2019-11-03 ENCOUNTER — Encounter: Payer: Self-pay | Admitting: Family Medicine

## 2019-11-03 MED ORDER — AMPHETAMINE-DEXTROAMPHETAMINE 20 MG PO TABS: 20.0000 mg | ORAL_TABLET | Freq: Two times a day (BID) | ORAL | 0 refills | Status: DC

## 2019-11-03 NOTE — Telephone Encounter (Signed)
Last filled 10/04/2019 Last OV 10/21/2018

## 2019-11-03 NOTE — Telephone Encounter (Signed)
Done

## 2019-11-04 ENCOUNTER — Telehealth: Payer: BC Managed Care – PPO | Admitting: Family

## 2019-11-04 DIAGNOSIS — R21 Rash and other nonspecific skin eruption: Secondary | ICD-10-CM

## 2019-11-04 MED ORDER — TRIAMCINOLONE ACETONIDE 0.5 % EX OINT: 1.0000 "application " | TOPICAL_OINTMENT | Freq: Two times a day (BID) | CUTANEOUS | 0 refills | Status: DC

## 2019-11-04 MED ORDER — PREDNISONE 10 MG PO TABS: 10.0000 mg | ORAL_TABLET | Freq: Every day | ORAL | 0 refills | Status: DC

## 2019-11-04 NOTE — Progress Notes (Signed)
E Visit for Rash  We are sorry that you are not feeling well. Here is how we plan to help!  Based on what you shared with me it looks like you have contact dermatitis.  Contact dermatitis is a skin rash caused by something that touches the skin and causes irritation or inflammation.  Your skin may be red, swollen, dry, cracked, and itch.  The rash should go away in a few days but can last a few weeks.  If you get a rash, it's important to figure out what caused it so the irritant can be avoided in the future. and I have prescribed Prednisone 10 mg daily for 5 days and sent in kenalog 0.5% cream that you apply twice.    HOME CARE:   Take cool showers and avoid direct sunlight.  Apply cool compress or wet dressings.  Take a bath in an oatmeal bath.  Sprinkle content of one Aveeno packet under running faucet with comfortably warm water.  Bathe for 15-20 minutes, 1-2 times daily.  Pat dry with a towel. Do not rub the rash.  Use hydrocortisone cream.  Take an antihistamine like Benadryl for widespread rashes that itch.  The adult dose of Benadryl is 25-50 mg by mouth 4 times daily.  Caution:  This type of medication may cause sleepiness.  Do not drink alcohol, drive, or operate dangerous machinery while taking antihistamines.  Do not take these medications if you have prostate enlargement.  Read package instructions thoroughly on all medications that you take.  GET HELP RIGHT AWAY IF:   Symptoms don't go away after treatment.  Severe itching that persists.  If you rash spreads or swells.  If you rash begins to smell.  If it blisters and opens or develops a yellow-brown crust.  You develop a fever.  You have a sore throat.  You become short of breath.  MAKE SURE YOU:  Understand these instructions. Will watch your condition. Will get help right away if you are not doing well or get worse.  Thank you for choosing an e-visit. Your e-visit answers were reviewed by a board  certified advanced clinical practitioner to complete your personal care plan. Depending upon the condition, your plan could have included both over the counter or prescription medications. Please review your pharmacy choice. Be sure that the pharmacy you have chosen is open so that you can pick up your prescription now.  If there is a problem you may message your provider in [place] to have the prescription routed to another pharmacy. Your safety is important to [place]. If you have drug allergies check your prescription carefully.  For the next 24 hours, you can use MyChart to ask questions about today's visit, request a non-urgent call back, or ask for a work or school excuse from your e-visit provider. You will get an email in the next two days asking about your experience. I hope that your e-visit has been valuable and will speed your recovery.   Approximately 5 minutes was spent documenting and reviewing patient's chart.

## 2019-11-16 ENCOUNTER — Telehealth: Payer: BC Managed Care – PPO | Admitting: Physician Assistant

## 2019-11-16 DIAGNOSIS — R21 Rash and other nonspecific skin eruption: Secondary | ICD-10-CM | POA: Diagnosis not present

## 2019-11-16 MED ORDER — MUPIROCIN 2 % EX OINT: 1.0000 "application " | TOPICAL_OINTMENT | Freq: Two times a day (BID) | CUTANEOUS | 0 refills | Status: DC

## 2019-11-16 NOTE — Progress Notes (Signed)
E Visit for Rash  We are sorry that you are not feeling well. Here is how we plan to help!  The lesion the is noted below your nose could be related to an infection called impetigo and for this I have prescribed a medication called Mupirocin.  With regard to the photos you have attached of your eyes, there are some limitations to diagnosing a rash through an e-visit rather than in person. If those lesions are stable and have been evaluated by your eye doctor then there likely is not any new intervention that will be needed today. However, if those lesions are new (specifically, the lesion to the left upper lid and right lower lid) they may indicate a viral infection called zoster ophthalmicus which can be a very bad infection that can lead to blindness if it spreads to the eye. With that being said, if the eye lesions are new and are painful/itchy, then you need to be assessed in person by your regular doctor, at urgent care, in the emergency room, or by your eye doctor as soon as possible.  Additionally, you had mentioned that you have a steroid cream at home that you had previously been using for your body. I would like to clarify that you should not use the steroid cream on your face as it can actually lead to worse skin problems.   HOME CARE:   Take cool showers and avoid direct sunlight.  Apply cool compress or wet dressings.  Take a bath in an oatmeal bath.  Sprinkle content of one Aveeno packet under running faucet with comfortably warm water.  Bathe for 15-20 minutes, 1-2 times daily.  Pat dry with a towel. Do not rub the rash.  Use hydrocortisone cream.  Take an antihistamine like Benadryl for widespread rashes that itch.  The adult dose of Benadryl is 25-50 mg by mouth 4 times daily.  Caution:  This type of medication may cause sleepiness.  Do not drink alcohol, drive, or operate dangerous machinery while taking antihistamines.  Do not take these medications if you have prostate  enlargement.  Read package instructions thoroughly on all medications that you take.  GET HELP RIGHT AWAY IF:   Symptoms don't go away after treatment.  Severe itching that persists.  If you rash spreads or swells.  If you rash begins to smell.  If it blisters and opens or develops a yellow-brown crust.  You develop a fever.  You have a sore throat.  You become short of breath.  MAKE SURE YOU:  Understand these instructions. Will watch your condition. Will get help right away if you are not doing well or get worse.  Thank you for choosing an e-visit. Your e-visit answers were reviewed by a board certified advanced clinical practitioner to complete your personal care plan. Depending upon the condition, your plan could have included both over the counter or prescription medications. Please review your pharmacy choice. Be sure that the pharmacy you have chosen is open so that you can pick up your prescription now.  If there is a problem you may message your provider in [place] to have the prescription routed to another pharmacy. Your safety is important to [place]. If you have drug allergies check your prescription carefully.  For the next 24 hours, you can use MyChart to ask questions about today's visit, request a non-urgent call back, or ask for a work or school excuse from your e-visit provider. You will get an email in the next two  days asking about your experience. I hope that your e-visit has been valuable and will speed your recovery.  Greater than 5 minutes, yet less than 10 minutes of time have been spend researching, coordinating, and implementing care for this patient today.

## 2019-11-19 ENCOUNTER — Encounter: Payer: Self-pay | Admitting: Adult Health

## 2019-11-19 ENCOUNTER — Telehealth (INDEPENDENT_AMBULATORY_CARE_PROVIDER_SITE_OTHER): Payer: BC Managed Care – PPO | Admitting: Adult Health

## 2019-11-19 DIAGNOSIS — R21 Rash and other nonspecific skin eruption: Secondary | ICD-10-CM | POA: Diagnosis not present

## 2019-11-19 MED ORDER — VALACYCLOVIR HCL 1 G PO TABS: 1000.0000 mg | ORAL_TABLET | Freq: Three times a day (TID) | ORAL | 0 refills | Status: DC

## 2019-11-19 NOTE — Progress Notes (Signed)
Virtual Visit via Video Note  I connected with Jordan Robles on 11/19/19 at  1:30 PM EST by a video enabled telemedicine application and verified that I am speaking with the correct person using two identifiers.  Location patient: home Location provider:work or home office Persons participating in the virtual visit: patient, provider  I discussed the limitations of evaluation and management by telemedicine and the availability of in person appointments. The patient expressed understanding and agreed to proceed.   HPI: He is being evaluated today for an acute issue skin issue.  Was originally seen on 11/04/2019 via E visit for itchy rash on legs, arms, and chest.  He was diagnosed with contact dermatitis and prescribed prednisone 10 mg for 5 days and Kenalog cream.  Reports that his rash improved but he was seen again on 11/16/2019 for an E visit at which time he had started to notice a painful "bump" under his left nostril about a week prior.  This bump then moved to the middle part under his nose which caused a lot of pain, discomfort and swelling.  This time he was prescribed new Mupirocin for suspected impetigo.  He reports that he has been applying this ointment as directed but has not seen any improvement.  The bump has not increased in size but it continues to be red and painful.  He does report blisterlike vesicles that sometimes drain clear and other times drain purulent discharge.  He denies any history of HPV but does report that his wife does get cold sores.   ROS: See pertinent positives and negatives per HPI.  Past Medical History:  Diagnosis Date  . ADHD (attention deficit hyperactivity disorder)   . Genital warts   . Hyperlipidemia   . Learning disability   . Testicular cancer ([place]) 1997    Past Surgical History:  Procedure Laterality Date  . left orchiectomy  1997    Family History  Problem Relation Age of Onset  . Hyperlipidemia Other   . Hypertension Other         Current Outpatient Medications:  .  [START ON 01/01/2020] amphetamine-dextroamphetamine (ADDERALL) 20 MG tablet, Take 1 tablet (20 mg total) by mouth 2 (two) times daily., Disp: 60 tablet, Rfl: 0 .  aspirin 81 MG tablet, Take 81 mg by mouth daily., Disp: , Rfl:  .  atorvastatin (LIPITOR) 40 MG tablet, TAKE 1 TABLET DAILY, Disp: 90 tablet, Rfl: 0 .  DRYSOL 20 % external solution, APPLY EXTERNALLY TO THE AFFECTED AREA DAILY, Disp: 60 mL, Rfl: 0 .  fenofibrate 160 MG tablet, TAKE 1 TABLET BY MOUTH EVERY DAY, Disp: 90 tablet, Rfl: 0 .  fluticasone (FLONASE) 50 MCG/ACT nasal spray, Place 2 sprays into both nostrils daily., Disp: 16 g, Rfl: 11 .  levocetirizine (XYZAL) 5 MG tablet, Take 1 tablet (5 mg total) by mouth every evening., Disp: 30 tablet, Rfl: 11 .  mupirocin ointment (BACTROBAN) 2 %, Place 1 application into the nose 2 (two) times daily. Place 1 application over the lesion twice daily for 7 days., Disp: 22 g, Rfl: 0 .  niacin (NIASPAN) 1000 MG CR tablet, TAKE 1 TABLET BY MOUTH AT BEDTIME, Disp: 90 tablet, Rfl: 0 .  nicotine (NICODERM CQ) 14 mg/24hr patch, Place 1 patch (14 mg total) onto the skin daily., Disp: 28 patch, Rfl: 2 .  Omega-3 Fatty Acids (FISH OIL PO), Take by mouth daily., Disp: , Rfl:  .  predniSONE (DELTASONE) 10 MG tablet, Take 1 tablet (10 mg total)  by mouth daily with breakfast., Disp: 5 tablet, Rfl: 0 .  temazepam (RESTORIL) 30 MG capsule, TAKE 1 CAPSULE BY MOUTH EVERY DAY AT BEDTIME AS NEEDED FOR SLEEP, Disp: 30 capsule, Rfl: 5 .  triamcinolone ointment (KENALOG) 0.5 %, Apply 1 application topically 2 (two) times daily., Disp: 30 g, Rfl: 0 .  valACYclovir (VALTREX) 1000 MG tablet, Take 1 tablet (1,000 mg total) by mouth 3 (three) times daily for 7 days., Disp: 21 tablet, Rfl: 0  EXAM:  VITALS per patient if applicable:  GENERAL: alert, oriented, appears well and in no acute distress  HEENT: atraumatic, conjunttiva clear, no obvious abnormalities on  inspection of external nose and ears  NECK: normal movements of the head and neck  LUNGS: on inspection no signs of respiratory distress, breathing rate appears normal, no obvious gross SOB, gasping or wheezing  CV: no obvious cyanosis  MS: moves all visible extremities without noticeable abnormality  PSYCH/NEURO: pleasant and cooperative, no obvious depression or anxiety, speech and thought processing grossly intact  SKIN: Red irritated eruption noted at the philtrum with 2 vesicles with purulent discharge.  ASSESSMENT AND PLAN:  1. Skin eruption Appears viral in origin possibly eczema herpeticum?  Will prescribe Valtrex 1000 mg 3 times daily x7 days.  We will have him come in tomorrow for an office visit for closer look and see if we can do a wound culture. - valACYclovir (VALTREX) 1000 MG tablet; Take 1 tablet (1,000 mg total) by mouth 3 (three) times daily for 7 days.  Dispense: 21 tablet; Refill: 0   Discussed the following assessment and plan:      I discussed the assessment and treatment plan with the patient. The patient was provided an opportunity to ask questions and all were answered. The patient agreed with the plan and demonstrated an understanding of the instructions.   The patient was advised to call back or seek an in-person evaluation if the symptoms worsen or if the condition fails to improve as anticipated.   Dorothyann Peng, NP

## 2019-11-20 ENCOUNTER — Encounter: Payer: Self-pay | Admitting: Adult Health

## 2019-11-20 ENCOUNTER — Ambulatory Visit (INDEPENDENT_AMBULATORY_CARE_PROVIDER_SITE_OTHER): Payer: BC Managed Care – PPO | Admitting: Adult Health

## 2019-11-20 ENCOUNTER — Other Ambulatory Visit: Payer: Self-pay

## 2019-11-20 VITALS — BP 120/84 | Temp 97.7°F | Wt 164.0 lb

## 2019-11-20 DIAGNOSIS — L0291 Cutaneous abscess, unspecified: Secondary | ICD-10-CM

## 2019-11-20 MED ORDER — CLINDAMYCIN HCL 300 MG PO CAPS: 300.0000 mg | ORAL_CAPSULE | Freq: Three times a day (TID) | ORAL | 0 refills | Status: AC

## 2019-11-20 NOTE — Progress Notes (Signed)
Subjective:    Patient ID: Jordan Robles, male    DOB: 1981-01-30, 39 y.o.   MRN: [id number]  HPI  39 year old male who  has a past medical history of ADHD (attention deficit hyperactivity disorder), Genital warts, Hyperlipidemia, Learning disability, and Testicular cancer ([place]) (1997).  He presents to the office today for follow up after being seen via virtual visit yesterday for a non specific skin eruption. On virtual visit yesterday he appeared to have a herpetic like vesicles under the tip of his nose. He was started on Valtrex 1000 mg TID x 7 days. He was asked to come into the office to have a wound culture done.   Review of Systems See HPI   Past Medical History:  Diagnosis Date  . ADHD (attention deficit hyperactivity disorder)   . Genital warts   . Hyperlipidemia   . Learning disability   . Testicular cancer ([place]) 1997    Social History   Socioeconomic History  . Marital status: Married    Spouse name: Not on file  . Number of children: Not on file  . Years of education: Not on file  . Highest education level: Not on file  Occupational History  . Not on file  Tobacco Use  . Smoking status: Former Smoker    Types: Cigarettes    Quit date: 07/18/2017    Years since quitting: 2.3  . Smokeless tobacco: Never Used  . Tobacco comment: 5 CIGARETTES DAILY   Substance and Sexual Activity  . Alcohol use: Yes    Alcohol/week: 0.0 standard drinks    Comment: occ  . Drug use: No  . Sexual activity: Not on file  Other Topics Concern  . Not on file  Social History Narrative  . Not on file   Social Determinants of Health   Financial Resource Strain:   . Difficulty of Paying Living Expenses: Not on file  Food Insecurity:   . Worried About Charity fundraiser in the Last Year: Not on file  . Ran Out of Food in the Last Year: Not on file  Transportation Needs:   . Lack of Transportation (Medical): Not on file  . Lack of Transportation (Non-Medical): Not on file   Physical Activity:   . Days of Exercise per Week: Not on file  . Minutes of Exercise per Session: Not on file  Stress:   . Feeling of Stress : Not on file  Social Connections:   . Frequency of Communication with Friends and Family: Not on file  . Frequency of Social Gatherings with Friends and Family: Not on file  . Attends Religious Services: Not on file  . Active Member of Clubs or Organizations: Not on file  . Attends Archivist Meetings: Not on file  . Marital Status: Not on file  Intimate Partner Violence:   . Fear of Current or Ex-Partner: Not on file  . Emotionally Abused: Not on file  . Physically Abused: Not on file  . Sexually Abused: Not on file    Past Surgical History:  Procedure Laterality Date  . left orchiectomy  1997    Family History  Problem Relation Age of Onset  . Hyperlipidemia Other   . Hypertension Other     Allergies  Allergen Reactions  . Doxycycline     hives    Current Outpatient Medications on File Prior to Visit  Medication Sig Dispense Refill  . [START ON 01/01/2020] amphetamine-dextroamphetamine (ADDERALL) 20 MG tablet  Take 1 tablet (20 mg total) by mouth 2 (two) times daily. 60 tablet 0  . aspirin 81 MG tablet Take 81 mg by mouth daily.    Marland Kitchen atorvastatin (LIPITOR) 40 MG tablet TAKE 1 TABLET DAILY 90 tablet 0  . DRYSOL 20 % external solution APPLY EXTERNALLY TO THE AFFECTED AREA DAILY (Patient not taking: Reported on 11/20/2019) 60 mL 0  . fenofibrate 160 MG tablet TAKE 1 TABLET BY MOUTH EVERY DAY 90 tablet 0  . fluticasone (FLONASE) 50 MCG/ACT nasal spray Place 2 sprays into both nostrils daily. 16 g 11  . levocetirizine (XYZAL) 5 MG tablet Take 1 tablet (5 mg total) by mouth every evening. 30 tablet 11  . niacin (NIASPAN) 1000 MG CR tablet TAKE 1 TABLET BY MOUTH AT BEDTIME 90 tablet 0  . nicotine (NICODERM CQ) 14 mg/24hr patch Place 1 patch (14 mg total) onto the skin daily. 28 patch 2  . Omega-3 Fatty Acids (FISH OIL PO) Take  by mouth daily.    . temazepam (RESTORIL) 30 MG capsule TAKE 1 CAPSULE BY MOUTH EVERY DAY AT BEDTIME AS NEEDED FOR SLEEP 30 capsule 5  . valACYclovir (VALTREX) 1000 MG tablet Take 1 tablet (1,000 mg total) by mouth 3 (three) times daily for 7 days. 21 tablet 0   No current facility-administered medications on file prior to visit.    BP 120/84   Temp 97.7 F (36.5 C) (Temporal)   Wt 164 lb (74.4 kg)   BMI 24.94 kg/m       Objective:   Physical Exam Vitals and nursing note reviewed.  Constitutional:      Appearance: Normal appearance.  Skin:    General: Skin is warm and dry.     Comments: Abscess noted on philtrum with purulent discharge   Neurological:     General: No focal deficit present.     Mental Status: He is alert and oriented to person, place, and time.  Psychiatric:        Mood and Affect: Mood normal.        Behavior: Behavior normal.        Thought Content: Thought content normal.        Judgment: Judgment normal.       Assessment & Plan:  1. Abscess -Appears to be bacterial instead of viral.  We will have him stop Valtrex.  Likely MRSA will cover with clindamycin.  He can continue with topical antibiotic cream that he received from E visit.  Follow-up in the clinic if no improvement in the next 2 to 3 days - clindamycin (CLEOCIN) 300 MG capsule; Take 1 capsule (300 mg total) by mouth 3 (three) times daily for 10 days.  Dispense: 30 capsule; Refill: 0 - WOUND CULTURE   Dorothyann Peng, NP

## 2019-11-22 ENCOUNTER — Encounter: Payer: Self-pay | Admitting: Adult Health

## 2019-11-23 ENCOUNTER — Encounter: Payer: Self-pay | Admitting: Adult Health

## 2019-11-23 LAB — WOUND CULTURE
MICRO NUMBER:: 10220209
SPECIMEN QUALITY:: ADEQUATE

## 2019-11-24 NOTE — Telephone Encounter (Signed)
Tell him we can certainly address this at the physical. He would not need a separate appt

## 2019-11-28 ENCOUNTER — Ambulatory Visit: Payer: BC Managed Care – PPO | Attending: Internal Medicine

## 2019-11-28 DIAGNOSIS — Z23 Encounter for immunization: Secondary | ICD-10-CM

## 2019-11-28 NOTE — Progress Notes (Signed)
   Covid-19 Vaccination Clinic  Name:  Jordan Robles    MRN: [id number] DOB: February 18, 1981  11/28/2019  Jordan Robles was observed post Covid-19 immunization for 15 minutes without incident. He was provided with Vaccine Information Sheet and instruction to access the V-Safe system.   Jordan Robles was instructed to call 911 with any severe reactions post vaccine: Marland Kitchen Difficulty breathing  . Swelling of face and throat  . A fast heartbeat  . A bad rash all over body  . Dizziness and weakness   Immunizations Administered    Name Date Dose VIS Date Route   Pfizer COVID-19 Vaccine 11/28/2019  1:23 PM 0.3 mL 08/28/2019 Intramuscular   Manufacturer: [place]   Lot: [id number]   [place]: [id number]

## 2019-12-17 ENCOUNTER — Other Ambulatory Visit: Payer: Self-pay

## 2019-12-21 ENCOUNTER — Ambulatory Visit (INDEPENDENT_AMBULATORY_CARE_PROVIDER_SITE_OTHER): Payer: BC Managed Care – PPO | Admitting: Family Medicine

## 2019-12-21 ENCOUNTER — Encounter: Payer: Self-pay | Admitting: Family Medicine

## 2019-12-21 ENCOUNTER — Other Ambulatory Visit: Payer: Self-pay

## 2019-12-21 VITALS — BP 120/74 | HR 94 | Temp 98.4°F | Ht 68.0 in | Wt 168.2 lb

## 2019-12-21 DIAGNOSIS — Z0000 Encounter for general adult medical examination without abnormal findings: Secondary | ICD-10-CM

## 2019-12-21 DIAGNOSIS — M67442 Ganglion, left hand: Secondary | ICD-10-CM | POA: Diagnosis not present

## 2019-12-21 DIAGNOSIS — H00014 Hordeolum externum left upper eyelid: Secondary | ICD-10-CM

## 2019-12-21 DIAGNOSIS — G4482 Headache associated with sexual activity: Secondary | ICD-10-CM

## 2019-12-21 DIAGNOSIS — J3089 Other allergic rhinitis: Secondary | ICD-10-CM

## 2019-12-21 LAB — BASIC METABOLIC PANEL
BUN: 8 mg/dL (ref 6–23)
CO2: 29 mEq/L (ref 19–32)
Calcium: 9.5 mg/dL (ref 8.4–10.5)
Chloride: 102 mEq/L (ref 96–112)
Creatinine, Ser: 0.87 mg/dL (ref 0.40–1.50)
GFR: 96.62 mL/min (ref >60.00)
Glucose, Bld: 83 mg/dL (ref 70–99)
Potassium: 4.3 mEq/L (ref 3.5–5.1)
Sodium: 140 mEq/L (ref 135–145)

## 2019-12-21 LAB — HEPATIC FUNCTION PANEL
ALT: 33 U/L (ref 0–53)
AST: 26 U/L (ref 0–37)
Albumin: 4.5 g/dL (ref 3.5–5.2)
Alkaline Phosphatase: 31 U/L — ABNORMAL LOW (ref 39–117)
Bilirubin, Direct: 0.2 mg/dL (ref 0.0–0.3)
Total Bilirubin: 1.1 mg/dL (ref 0.2–1.2)
Total Protein: 6.5 g/dL (ref 6.0–8.3)

## 2019-12-21 LAB — CBC WITH DIFFERENTIAL/PLATELET
Basophils Absolute: 0.1 10*3/uL (ref 0.0–0.1)
Basophils Relative: 1.1 % (ref 0.0–3.0)
Eosinophils Absolute: 0.3 10*3/uL (ref 0.0–0.7)
Eosinophils Relative: 4.1 % (ref 0.0–5.0)
HCT: 46.2 % (ref 39.0–52.0)
Hemoglobin: 15.5 g/dL (ref 13.0–17.0)
Lymphocytes Relative: 25.3 % (ref 12.0–46.0)
Lymphs Abs: 1.8 10*3/uL (ref 0.7–4.0)
MCHC: 33.5 g/dL (ref 30.0–36.0)
MCV: 96 fl (ref 78.0–100.0)
Monocytes Absolute: 0.6 10*3/uL (ref 0.1–1.0)
Monocytes Relative: 9.1 % (ref 3.0–12.0)
Neutro Abs: 4.3 10*3/uL (ref 1.4–7.7)
Neutrophils Relative %: 60.4 % (ref 43.0–77.0)
Platelets: 276 10*3/uL (ref 150.0–400.0)
RBC: 4.81 Mil/uL (ref 4.22–5.81)
RDW: 13.7 % (ref 11.5–15.5)
WBC: 7 10*3/uL (ref 4.0–10.5)

## 2019-12-21 LAB — LIPID PANEL
Cholesterol: 129 mg/dL (ref 0–200)
HDL: 46.5 mg/dL (ref >39.00)
LDL Cholesterol: 57 mg/dL (ref 0–99)
NonHDL: 82.44
Total CHOL/HDL Ratio: 3
Triglycerides: 126 mg/dL (ref 0.0–149.0)
VLDL: 25.2 mg/dL (ref 0.0–40.0)

## 2019-12-21 LAB — TSH: TSH: 2.91 u[IU]/mL (ref 0.35–4.50)

## 2019-12-21 MED ORDER — NIACIN ER (ANTIHYPERLIPIDEMIC) 1000 MG PO TBCR: 1000.0000 mg | EXTENDED_RELEASE_TABLET | Freq: Every day | ORAL | 3 refills | Status: DC

## 2019-12-21 MED ORDER — SUMATRIPTAN SUCCINATE 100 MG PO TABS: 100.0000 mg | ORAL_TABLET | ORAL | 5 refills | Status: DC | PRN

## 2019-12-21 MED ORDER — ATORVASTATIN CALCIUM 40 MG PO TABS: ORAL_TABLET | ORAL | 3 refills | Status: DC

## 2019-12-21 MED ORDER — FENOFIBRATE 160 MG PO TABS: 160.0000 mg | ORAL_TABLET | Freq: Every day | ORAL | 3 refills | Status: DC

## 2019-12-21 NOTE — Progress Notes (Signed)
Subjective:    Patient ID: Jordan Robles, male    DOB: Feb 05, 1981, 39 y.o.   MRN: [id number]  HPI Here for a well exam. He has several issues to discuss. First over the past year he has had about 4 instances of immediately getting a severe throbbing headache when he climaxes during intercourse. This lasts about 20 minutes and then goes away. These headaches come in clusters where it may happen 2-3 times in a week, and then 3-4 months go by before it happens again. Second 3 months ago he noticed a lump in his left thumb. This has become painful and it interferes with certain activities such as playing his drums. Third he developed a stye on the left upper eyelid a month ago. The soreness has stopped but the stye will not go away. Fourth he has a lot of trouble with itchy eyes, sneezing, and skin itching despite Xyzal and Flonase daily. He wants to be tested for allergies.    Review of Systems  Constitutional: Negative.   HENT: Negative.   Eyes: Negative.   Respiratory: Negative.   Cardiovascular: Negative.   Gastrointestinal: Negative.   Genitourinary: Negative.   Musculoskeletal: Negative.   Skin: Negative.   Neurological: Positive for headaches.  Psychiatric/Behavioral: Negative.        Objective:   Physical Exam Constitutional:      General: He is not in acute distress.    Appearance: Normal appearance. He is well-developed. He is not diaphoretic.  HENT:     Head: Normocephalic and atraumatic.     Right Ear: External ear normal.     Left Ear: External ear normal.     Nose: Nose normal.     Mouth/Throat:     Pharynx: No oropharyngeal exudate.  Eyes:     General: No scleral icterus.       Right eye: No discharge.        Left eye: No discharge.     Conjunctiva/sclera: Conjunctivae normal.     Pupils: Pupils are equal, round, and reactive to light.     Comments: There is a firm non-tender papule on the lash line of the left upper eyelid   Neck:     Thyroid: No  thyromegaly.     Vascular: No JVD.     Trachea: No tracheal deviation.  Cardiovascular:     Rate and Rhythm: Normal rate and regular rhythm.     Heart sounds: Normal heart sounds. No murmur. No friction rub. No gallop.   Pulmonary:     Effort: Pulmonary effort is normal. No respiratory distress.     Breath sounds: Normal breath sounds. No wheezing or rales.  Chest:     Chest wall: No tenderness.  Abdominal:     General: Bowel sounds are normal. There is no distension.     Palpations: Abdomen is soft. There is no mass.     Tenderness: There is no abdominal tenderness. There is no guarding or rebound.  Genitourinary:    Penis: Normal. No tenderness.      Prostate: Normal.     Rectum: Normal. Guaiac result negative.  Musculoskeletal:        General: No tenderness. Normal range of motion.     Cervical back: Neck supple.     Comments: There is a firm mobile tender round lesion on the flexor surface of the left thumb   Lymphadenopathy:     Cervical: No cervical adenopathy.  Skin:    General: Skin  is warm and dry.     Coloration: Skin is not pale.     Findings: No erythema or rash.  Neurological:     Mental Status: He is alert and oriented to person, place, and time.     Cranial Nerves: No cranial nerve deficit.     Motor: No abnormal muscle tone.     Coordination: Coordination normal.     Deep Tendon Reflexes: Reflexes are normal and symmetric. Reflexes normal.  Psychiatric:        Behavior: Behavior normal.        Thought Content: Thought content normal.        Judgment: Judgment normal.           Assessment & Plan:  Well exam. We discussed diet and exercise. Get fasting labs. For the stye, refer to Ophthalmology. For allergy testing, refer to Allergy. For the ganglion cyst on the thumb, refer to Hand Surgery. For the orgasm headaches, try Imitrex as needed.  Alysia Penna, MD

## 2019-12-23 ENCOUNTER — Ambulatory Visit: Payer: BC Managed Care – PPO | Attending: Internal Medicine

## 2019-12-23 DIAGNOSIS — Z23 Encounter for immunization: Secondary | ICD-10-CM

## 2019-12-23 NOTE — Progress Notes (Signed)
   Covid-19 Vaccination Clinic  Name:  Jordan Robles    MRN: [id number] DOB: 05/25/1981  12/23/2019  [name] was observed post Covid-19 immunization for 15 minutes without incident. He was provided with Vaccine Information Sheet and instruction to access the V-Safe system.   [name] was instructed to call 911 with any severe reactions post vaccine: Marland Kitchen Difficulty breathing  . Swelling of face and throat  . A fast heartbeat  . A bad rash all over body  . Dizziness and weakness   Immunizations Administered    Name Date Dose VIS Date Route   Pfizer COVID-19 Vaccine 12/23/2019 10:49 AM 0.3 mL 08/28/2019 Intramuscular   Manufacturer: [employer]   Lot: [id number]   [place]: [id number]

## 2020-01-26 ENCOUNTER — Other Ambulatory Visit: Payer: Self-pay | Admitting: Family Medicine

## 2020-01-28 MED ORDER — AMPHETAMINE-DEXTROAMPHETAMINE 20 MG PO TABS: 20.0000 mg | ORAL_TABLET | Freq: Two times a day (BID) | ORAL | 0 refills | Status: DC

## 2020-01-28 NOTE — Telephone Encounter (Signed)
Done

## 2020-01-28 NOTE — Telephone Encounter (Signed)
Last fill 01/01/2020 Last OV 12/21/2019  Ok to fill?

## 2020-04-27 ENCOUNTER — Other Ambulatory Visit: Payer: Self-pay | Admitting: Family Medicine

## 2020-04-27 MED ORDER — AMPHETAMINE-DEXTROAMPHETAMINE 20 MG PO TABS: 20.0000 mg | ORAL_TABLET | Freq: Two times a day (BID) | ORAL | 0 refills | Status: DC

## 2020-04-27 NOTE — Telephone Encounter (Signed)
Last OV 12/21/2019

## 2020-04-27 NOTE — Telephone Encounter (Signed)
Done

## 2020-05-02 ENCOUNTER — Encounter: Payer: Self-pay | Admitting: Family Medicine

## 2020-05-03 MED ORDER — FENOFIBRATE 150 MG PO CAPS: 150.0000 mg | ORAL_CAPSULE | Freq: Every day | ORAL | 3 refills | Status: DC

## 2020-05-03 NOTE — Telephone Encounter (Signed)
I understand. Cancel the fenofibrate tablet and call in fenofibrate 150 mg capsule to take daily, #90 with 3 rf

## 2020-06-25 ENCOUNTER — Encounter: Payer: Self-pay | Admitting: Family Medicine

## 2020-07-25 ENCOUNTER — Encounter: Payer: Self-pay | Admitting: Family Medicine

## 2020-07-29 ENCOUNTER — Other Ambulatory Visit: Payer: Self-pay | Admitting: Family Medicine

## 2020-07-29 MED ORDER — AMPHETAMINE-DEXTROAMPHETAMINE 20 MG PO TABS: 20.0000 mg | ORAL_TABLET | Freq: Two times a day (BID) | ORAL | 0 refills | Status: DC

## 2020-07-29 NOTE — Telephone Encounter (Signed)
Please see message and advise.  Thank you. Last OV 12/21/19 Last fill 06/30/20 #60/0

## 2020-07-29 NOTE — Telephone Encounter (Signed)
Done

## 2020-08-08 ENCOUNTER — Other Ambulatory Visit: Payer: Self-pay | Admitting: Family Medicine

## 2020-08-08 DIAGNOSIS — J310 Chronic rhinitis: Secondary | ICD-10-CM

## 2020-08-18 NOTE — Progress Notes (Signed)
Key: [id number] - Rx #: [id number] Need help? Call us at [phone] Outcome Additional Information Required Your PA has been resolved, no additional PA is required. For further inquiries please contact the number on the back of the member prescription card. (Message [phone]) Drug Fenofibrate 160MG  tablets Form [employer] Electronic PA Form (2017 NCPDP) Original Claim Info 70,MR PENDING FORM REVIEW-USE ALT PROD OR DIS-CUSS W/MD. MED NECESSARY-[phone]-FORMULARY DRUG, CONTACT PRESCRIBER*WAG*Non-Form Product- Potential Alternatives are: [id number] - FENOFIBRATE TAB 145

## 2020-09-30 ENCOUNTER — Encounter: Payer: Self-pay | Admitting: Family Medicine

## 2020-09-30 NOTE — Telephone Encounter (Signed)
[facility] called and they advised that due to his new insurance requiring a PA, patient will have to pay out of pocket or wait until PA is complete.

## 2020-10-05 ENCOUNTER — Other Ambulatory Visit: Payer: Self-pay

## 2020-10-05 NOTE — Telephone Encounter (Signed)
Please check on this PA.

## 2020-10-05 NOTE — Telephone Encounter (Signed)
Medication sent to pharmacy  

## 2020-10-06 NOTE — Telephone Encounter (Signed)
Key: [id number] - PA Case ID: [id number] Need help? Call us at [phone] Status Sent to [facility] Adderall 20MG  tablets Form [employer] Electronic PA Form [id number] NCPDP)  Your information has been submitted to [place]. To check for an updated outcome later, reopen this PA request from your dashboard.  If [employer] has not responded to your request within 24 hours, contact [facility] at [phone]. If you think there may be a problem with your PA request, use our live chat feature at the bottom right.

## 2020-10-11 ENCOUNTER — Telehealth: Payer: Self-pay

## 2020-10-11 NOTE — Telephone Encounter (Signed)
Called [facility].   PA is approved for 36 months backdated to 09/11/2020.  PA number is [id number].  Patient has been sent My Chart message

## 2020-10-18 ENCOUNTER — Encounter: Payer: Self-pay | Admitting: Family Medicine

## 2020-10-18 ENCOUNTER — Other Ambulatory Visit: Payer: Self-pay

## 2020-10-18 ENCOUNTER — Ambulatory Visit (INDEPENDENT_AMBULATORY_CARE_PROVIDER_SITE_OTHER): Payer: BC Managed Care – PPO | Admitting: Family Medicine

## 2020-10-18 VITALS — BP 118/64 | HR 81 | Temp 97.9°F | Resp 18 | Ht 68.0 in | Wt 166.0 lb

## 2020-10-18 DIAGNOSIS — Z0000 Encounter for general adult medical examination without abnormal findings: Secondary | ICD-10-CM | POA: Diagnosis not present

## 2020-10-18 LAB — CBC WITH DIFFERENTIAL/PLATELET
Basophils Absolute: 0.1 10*3/uL (ref 0.0–0.1)
Basophils Relative: 2.1 % (ref 0.0–3.0)
Eosinophils Absolute: 0.2 10*3/uL (ref 0.0–0.7)
Eosinophils Relative: 3.8 % (ref 0.0–5.0)
HCT: 45.7 % (ref 39.0–52.0)
Hemoglobin: 15.9 g/dL (ref 13.0–17.0)
Lymphocytes Relative: 29.6 % (ref 12.0–46.0)
Lymphs Abs: 1.7 10*3/uL (ref 0.7–4.0)
MCHC: 34.7 g/dL (ref 30.0–36.0)
MCV: 95.3 fl (ref 78.0–100.0)
Monocytes Absolute: 0.5 10*3/uL (ref 0.1–1.0)
Monocytes Relative: 8.2 % (ref 3.0–12.0)
Neutro Abs: 3.3 10*3/uL (ref 1.4–7.7)
Neutrophils Relative %: 56.3 % (ref 43.0–77.0)
Platelets: 344 10*3/uL (ref 150.0–400.0)
RBC: 4.8 Mil/uL (ref 4.22–5.81)
RDW: 13 % (ref 11.5–15.5)
WBC: 5.8 10*3/uL (ref 4.0–10.5)

## 2020-10-18 LAB — LIPID PANEL
Cholesterol: 123 mg/dL (ref 0–200)
HDL: 47.3 mg/dL (ref >39.00)
LDL Cholesterol: 46 mg/dL (ref 0–99)
NonHDL: 75.53
Total CHOL/HDL Ratio: 3
Triglycerides: 148 mg/dL (ref 0.0–149.0)
VLDL: 29.6 mg/dL (ref 0.0–40.0)

## 2020-10-18 LAB — TSH: TSH: 2.83 u[IU]/mL (ref 0.35–4.50)

## 2020-10-18 LAB — BASIC METABOLIC PANEL
BUN: 9 mg/dL (ref 6–23)
CO2: 29 mEq/L (ref 19–32)
Calcium: 9.8 mg/dL (ref 8.4–10.5)
Chloride: 103 mEq/L (ref 96–112)
Creatinine, Ser: 0.84 mg/dL (ref 0.40–1.50)
GFR: 107.95 mL/min (ref >60.00)
Glucose, Bld: 86 mg/dL (ref 70–99)
Potassium: 4.8 mEq/L (ref 3.5–5.1)
Sodium: 138 mEq/L (ref 135–145)

## 2020-10-18 LAB — HEPATIC FUNCTION PANEL
ALT: 28 U/L (ref 0–53)
AST: 23 U/L (ref 0–37)
Albumin: 4.6 g/dL (ref 3.5–5.2)
Alkaline Phosphatase: 29 U/L — ABNORMAL LOW (ref 39–117)
Bilirubin, Direct: 0.2 mg/dL (ref 0.0–0.3)
Total Bilirubin: 0.8 mg/dL (ref 0.2–1.2)
Total Protein: 6.7 g/dL (ref 6.0–8.3)

## 2020-10-18 LAB — T4, FREE: Free T4: 1 ng/dL (ref 0.60–1.60)

## 2020-10-18 LAB — T3, FREE: T3, Free: 3 pg/mL (ref 2.3–4.2)

## 2020-10-18 MED ORDER — AMPHETAMINE-DEXTROAMPHETAMINE 20 MG PO TABS: 20.0000 mg | ORAL_TABLET | Freq: Two times a day (BID) | ORAL | 0 refills | Status: DC

## 2020-10-18 NOTE — Progress Notes (Signed)
Subjective:    Patient ID: Jordan Robles, male    DOB: [birth year], 40 y.o.   MRN: [id number]  HPI Here for a well exam. He feels fine.    Review of Systems  Constitutional: Negative.   HENT: Negative.   Eyes: Negative.   Respiratory: Negative.   Cardiovascular: Negative.   Gastrointestinal: Negative.   Genitourinary: Negative.   Musculoskeletal: Negative.   Skin: Negative.   Neurological: Negative.   Psychiatric/Behavioral: Negative.        Objective:   Physical Exam Constitutional:      General: He is not in acute distress.    Appearance: He is well-developed and well-nourished. He is not diaphoretic.  HENT:     Head: Normocephalic and atraumatic.     Right Ear: External ear normal.     Left Ear: External ear normal.     Nose: Nose normal.     Mouth/Throat:     Mouth: Oropharynx is clear and moist.     Pharynx: No oropharyngeal exudate.  Eyes:     General: No scleral icterus.       Right eye: No discharge.        Left eye: No discharge.     Extraocular Movements: EOM normal.     Conjunctiva/sclera: Conjunctivae normal.     Pupils: Pupils are equal, round, and reactive to light.  Neck:     Thyroid: No thyromegaly.     Vascular: No JVD.     Trachea: No tracheal deviation.  Cardiovascular:     Rate and Rhythm: Normal rate and regular rhythm.     Pulses: Intact distal pulses.     Heart sounds: Normal heart sounds. No murmur heard. No friction rub. No gallop.   Pulmonary:     Effort: Pulmonary effort is normal. No respiratory distress.     Breath sounds: Normal breath sounds. No wheezing or rales.  Chest:     Chest wall: No tenderness.  Abdominal:     General: Bowel sounds are normal. There is no distension.     Palpations: Abdomen is soft. There is no mass.     Tenderness: There is no abdominal tenderness. There is no guarding or rebound.  Genitourinary:    Penis: Normal. No tenderness.      Comments: Left testicle absent, right is normal   Musculoskeletal:        General: No tenderness or edema. Normal range of motion.     Cervical back: Neck supple.  Lymphadenopathy:     Cervical: No cervical adenopathy.  Skin:    General: Skin is warm and dry.     Coloration: Skin is not pale.     Findings: No erythema or rash.  Neurological:     Mental Status: He is alert and oriented to person, place, and time.     Cranial Nerves: No cranial nerve deficit.     Motor: No abnormal muscle tone.     Coordination: Coordination normal.     Deep Tendon Reflexes: Reflexes are normal and symmetric. Reflexes normal.  Psychiatric:        Mood and Affect: Mood and affect normal.        Behavior: Behavior normal.        Thought Content: Thought content normal.        Judgment: Judgment normal.           Assessment & Plan:  Well exam. We discussed diet and exercise. Get fasting labs.  Alysia Penna,  MD

## 2020-10-20 NOTE — Progress Notes (Signed)
Mychart message sent: All results are within normal limits

## 2021-01-07 ENCOUNTER — Telehealth: Payer: BC Managed Care – PPO | Admitting: Physician Assistant

## 2021-01-07 ENCOUNTER — Encounter: Payer: Self-pay | Admitting: Physician Assistant

## 2021-01-07 DIAGNOSIS — U071 COVID-19: Secondary | ICD-10-CM

## 2021-01-07 NOTE — Patient Instructions (Signed)
Can take to lessen severity and build immune system: Vit C 500mg  twice daily Quercertin 250-500mg  twice daily Zinc 75-100mg  daily Melatonin 3-6 mg at bedtime Vit D3 1000-2000 IU daily Aspirin 81 mg daily with food Optional: Famotidine 20mg  daily Also can add tylenol/ibuprofen as needed for fevers and body aches May add Mucinex or Mucinex DM as needed for cough/congestion   COVID-19: What to Do if You Are Sick If you have a fever, cough or other symptoms, you might have COVID-19. Most people have mild illness and are able to recover at home. If you are sick:  Keep track of your symptoms.  If you have an emergency warning sign (including trouble breathing), call 911. Steps to help prevent the spread of COVID-19 if you are sick If you are sick with COVID-19 or think you might have COVID-19, follow the steps below to care for yourself and to help protect other people in your home and community. Stay home except to get medical care  Stay home. Most people with COVID-19 have mild illness and can recover at home without medical care. Do not leave your home, except to get medical care. Do not visit public areas.  Take care of yourself. Get rest and stay hydrated. Take over-the-counter medicines, such as acetaminophen, to help you feel better.  Stay in touch with your doctor. Call before you get medical care. Be sure to get care if you have trouble breathing, or have any other emergency warning signs, or if you think it is an emergency.  Avoid public transportation, ride-sharing, or taxis. Separate yourself from other people As much as possible, stay in a specific room and away from other people and pets in your home. If possible, you should use a separate bathroom. If you need to be around other people or animals in or outside of the home, wear a mask. Tell your close contactsthat they may have been exposed to COVID-19. An infected person can spread COVID-19 starting 48 hours (or 2 days)  before the person has any symptoms or tests positive. By letting your close contacts know they may have been exposed to COVID-19, you are helping to protect everyone.  Additional guidance is available for those living in close quarters and shared housing.  See COVID-19 and Animals if you have questions about pets.  If you are diagnosed with COVID-19, someone from the health department may call you. Answer the call to slow the spread. Monitor your symptoms  Symptoms of COVID-19 include fever, cough, or other symptoms.  Follow care instructions from your healthcare provider and local health department. Your local health authorities may give instructions on checking your symptoms and reporting information. When to seek emergency medical attention Look for emergency warning signs* for COVID-19. If someone is showing any of these signs, seek emergency medical care immediately:  Trouble breathing  Persistent pain or pressure in the chest  New confusion  Inability to wake or stay awake  Pale, gray, or blue-colored skin, lips, or nail beds, depending on skin tone *This list is not all possible symptoms. Please call your medical provider for any other symptoms that are severe or concerning to you. Call 911 or call ahead to your local emergency facility: Notify the operator that you are seeking care for someone who has or may have COVID-19. Call ahead before visiting your doctor  Call ahead. Many medical visits for routine care are being postponed or done by phone or telemedicine.  If you have a medical appointment that  cannot be postponed, call your doctor's office, and tell them you have or may have COVID-19. This will help the office protect themselves and other patients. Get  tested  If you have symptoms of COVID-19, get tested. While waiting for test results, you stay away from others, including staying apart from those living in your household.  You can visit your state, tribal,  local, and territorialhealth department's website to look for the latest local information on testing sites. If you are sick, wear a mask over your nose and mouth  You should wear a mask over your nose and mouth if you must be around other people or animals, including pets (even at home).  You don't need to wear the mask if you are alone. If you can't put on a mask (because of trouble breathing, for example), cover your coughs and sneezes in some other way. Try to stay at least 6 feet away from other people. This will help protect the people around you.  Masks should not be placed on young children under age 63 years, anyone who has trouble breathing, or anyone who is not able to remove the mask without help. Note: During the COVID-19 pandemic, medical grade facemasks are reserved for healthcare workers and some first responders. Cover your coughs and sneezes  Cover your mouth and nose with a tissue when you cough or sneeze.  Throw away used tissues in a lined trash can.  Immediately wash your hands with soap and water for at least 20 seconds. If soap and water are not available, clean your hands with an alcohol-based hand sanitizer that contains at least 60% alcohol. Clean your hands often  Wash your hands often with soap and water for at least 20 seconds. This is especially important after blowing your nose, coughing, or sneezing; going to the bathroom; and before eating or preparing food.  Use hand sanitizer if soap and water are not available. Use an alcohol-based hand sanitizer with at least 60% alcohol, covering all surfaces of your hands and rubbing them together until they feel dry.  Soap and water are the best option, especially if hands are visibly dirty.  Avoid touching your eyes, nose, and mouth with unwashed hands.  Handwashing Tips Avoid sharing personal household items  Do not share dishes, drinking glasses, cups, eating utensils, towels, or bedding with other people in  your home.  Wash these items thoroughly after using them with soap and water or put in the dishwasher. Clean all "high-touch" surfaces everyday  Clean and disinfect high-touch surfaces in your "sick room" and bathroom; wear disposable gloves. Let someone else clean and disinfect surfaces in common areas, but you should clean your bedroom and bathroom, if possible.  If a caregiver or other person needs to clean and disinfect a sick person's bedroom or bathroom, they should do so on an as-needed basis. The caregiver/other person should wear a mask and disposable gloves prior to cleaning. They should wait as long as possible after the person who is sick has used the bathroom before coming in to clean and use the bathroom. ? High-touch surfaces include phones, remote controls, counters, tabletops, doorknobs, bathroom fixtures, toilets, keyboards, tablets, and bedside tables.  Clean and disinfect areas that may have blood, stool, or body fluids on them.  Use household cleaners and disinfectants. Clean the area or item with soap and water or another detergent if it is dirty. Then, use a household disinfectant. ? Be sure to follow the instructions on the label  to ensure safe and effective use of the product. Many products recommend keeping the surface wet for several minutes to ensure germs are killed. Many also recommend precautions such as wearing gloves and making sure you have good ventilation during use of the product. ? Use a product from [employer] List N: Disinfectants for Coronavirus (EHOZY-24). ? Complete Disinfection Guidance When you can be around others after being sick with COVID-19 Deciding when you can be around others is different for different situations. Find out when you can safely end home isolation. For any additional questions about your care, contact your healthcare provider or state or local health department. 12/02/2019 Content source: [facility] for Immunization and  Respiratory Diseases ([facility]), Division of Viral Diseases This information is not intended to replace advice given to you by your health care provider. Make sure you discuss any questions you have with your health care provider. Document Revised: 07/18/2020 Document Reviewed: 07/18/2020 Elsevier Patient Education  2021 [place] Can Do to Manage Your COVID-19 Symptoms at Home If you have possible or confirmed COVID-19: 1. Stay home except to get medical care. 2. Monitor your symptoms carefully. If your symptoms get worse, call your healthcare provider immediately. 3. Get rest and stay hydrated. 4. If you have a medical appointment, call the healthcare provider ahead of time and tell them that you have or may have COVID-19. 5. For medical emergencies, call 911 and notify the dispatch personnel that you have or may have COVID-19. 6. Cover your cough and sneezes with a tissue or use the inside of your elbow. 7. Wash your hands often with soap and water for at least 20 seconds or clean your hands with an alcohol-based hand sanitizer that contains at least 60% alcohol. 8. As much as possible, stay in a specific room and away from other people in your home. Also, you should use a separate bathroom, if available. If you need to be around other people in or outside of the home, wear a mask. 9. Avoid sharing personal items with other people in your household, like dishes, towels, and bedding. 10. Clean all surfaces that are touched often, like counters, tabletops, and doorknobs. Use household cleaning sprays or wipes according to the label instructions. [email or web address] 04/01/2020 This information is not intended to replace advice given to you by your health care provider. Make sure you discuss any questions you have with your health care provider. Document Revised: 07/18/2020 Document Reviewed: 07/18/2020 Elsevier Patient Education  2021 [employer].

## 2021-01-07 NOTE — Progress Notes (Signed)
Mr. klein, willcox are scheduled for a virtual visit with your provider today.    Just as we do with appointments in the office, we must obtain your consent to participate.  Your consent will be active for this visit and any virtual visit you may have with one of our providers in the next 365 days.    If you have a MyChart account, I can also send a copy of this consent to you electronically.  All virtual visits are billed to your insurance company just like a traditional visit in the office.  As this is a virtual visit, video technology does not allow for your provider to perform a traditional examination.  This may limit your provider's ability to fully assess your condition.  If your provider identifies any concerns that need to be evaluated in person or the need to arrange testing such as labs, EKG, etc, we will make arrangements to do so.    Although advances in technology are sophisticated, we cannot ensure that it will always work on either your end or our end.  If the connection with a video visit is poor, we may have to switch to a telephone visit.  With either a video or telephone visit, we are not always able to ensure that we have a secure connection.   I need to obtain your verbal consent now.   Are you willing to proceed with your visit today?   JAQUAVIAN FIRKUS has provided verbal consent on 01/07/2021 for a virtual visit (video or telephone).   Mar Daring, PA-C 01/07/2021  12:27 PM     MyChart Video Visit    Virtual Visit via Video Note   This visit type was conducted due to national recommendations for restrictions regarding the COVID-19 Pandemic (e.g. social distancing) in an effort to limit this patient's exposure and mitigate transmission in our community. This patient is at least at moderate risk for complications without adequate follow up. This format is felt to be most appropriate for this patient at this time. Physical exam was limited by quality of the video and  audio technology used for the visit.   Patient location: Home Provider location: Home office in [place]  I discussed the limitations of evaluation and management by telemedicine and the availability of in person appointments. The patient expressed understanding and agreed to proceed.  Patient: Jordan Robles   DOB: Aug 27, 1981   40 y.o. Male  MRN: [id number] Visit Date: 01/07/2021  Today's healthcare provider: Mar Daring, PA-C   No chief complaint on file.  Subjective    URI  This is a new problem. The current episode started yesterday (last night; started with fatigue and then tossed and turned). The problem has been gradually worsening. There has been no fever. Associated symptoms include congestion, coughing, diarrhea, ear pain, headaches and rhinorrhea. Pertinent negatives include no chest pain or sore throat.    Wife tested positive for Covid 19 on Sunday, 01/01/21. They have tried to isolate but last night he was very fatigued, but could not get comfortable to sleep. Then this morning woke up with more head pressure and congestion. Took at home test and was positive. Has been taking tylenol and Mucinex this morning.  Patient Active Problem List   Diagnosis Date Noted  . 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 Attention deficit hyperactivity disorder (ADHD) 10/14/2015  . INSOMNIA 12/16/2009  . HYPERHIDROSIS 07/27/2008  . 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 HYPERLIPIDEMIA 05/15/2007  .  LEARNING DISABILITY 05/15/2007   Past Medical History:  Diagnosis Date  . ADHD (attention deficit hyperactivity disorder)   . Genital warts   . Hyperlipidemia   . Learning disability   . 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 amphetamine-dextroamphetamine (ADDERALL) 20 MG tablet Take 1 tablet (20 mg total) by mouth 2 (two) times daily.  Marland Kitchen aspirin 81 MG tablet Take 81 mg by mouth daily.  Marland Kitchen  atorvastatin (LIPITOR) 40 MG tablet TAKE 1 TABLET DAILY  . DRYSOL 20 % external solution APPLY EXTERNALLY TO THE AFFECTED AREA DAILY  . Fenofibrate 150 MG CAPS Take 1 capsule (150 mg total) by mouth daily.  . fluticasone (FLONASE) 50 MCG/ACT nasal spray SHAKE LIQUID AND USE 2 SPRAYS IN EACH NOSTRIL DAILY  . levocetirizine (XYZAL) 5 MG tablet TAKE 1 TABLET(5 MG) BY MOUTH EVERY EVENING  . niacin (NIASPAN) 1000 MG CR tablet Take 1 tablet (1,000 mg total) by mouth at bedtime.  . nicotine (NICODERM CQ) 14 mg/24hr patch Place 1 patch (14 mg total) onto the skin daily. (Patient not taking: Reported on 10/18/2020)  . Omega-3 Fatty Acids (FISH OIL PO) Take by mouth daily.  . SUMAtriptan (IMITREX) 100 MG tablet Take 1 tablet (100 mg total) by mouth as needed for headache. May repeat in 2 hours if headache persists or recurs.  . temazepam (RESTORIL) 30 MG capsule TAKE 1 CAPSULE BY MOUTH EVERY DAY AT BEDTIME AS NEEDED FOR SLEEP   No facility-administered medications prior to visit.    Review of Systems  Constitutional: Positive for fatigue. Negative for chills and fever.  HENT: Positive for congestion, ear pain, postnasal drip, rhinorrhea and sinus pressure. Negative for sore throat.   Respiratory: Positive for cough, chest tightness and shortness of breath.   Cardiovascular: Negative for chest pain and palpitations.  Gastrointestinal: Positive for diarrhea.  Neurological: Positive for dizziness and headaches.    Last CBC Lab Results  Component Value Date   WBC 5.8 10/18/2020   HGB 15.9 10/18/2020   HCT 45.7 10/18/2020   MCV 95.3 10/18/2020   RDW 13.0 10/18/2020   PLT 344.0 93/23/5573   Last metabolic panel Lab Results  Component Value Date   GLUCOSE 86 10/18/2020   NA 138 10/18/2020   K 4.8 10/18/2020   CL 103 10/18/2020   CO2 29 10/18/2020   BUN 9 10/18/2020   CREATININE 0.84 10/18/2020   GFRNONAA 77.66 07/28/2010   GFRAA 102 07/06/2008   CALCIUM 9.8 10/18/2020   PROT 6.7 10/18/2020    ALBUMIN 4.6 10/18/2020   BILITOT 0.8 10/18/2020   ALKPHOS 29 (L) 10/18/2020   AST 23 10/18/2020   ALT 28 10/18/2020      Objective    There were no vitals taken for this visit. BP Readings from Last 3 Encounters:  10/18/20 118/64  12/21/19 120/74  11/20/19 120/84   Wt Readings from Last 3 Encounters:  10/18/20 166 lb (75.3 kg)  12/21/19 168 lb 3.2 oz (76.3 kg)  11/20/19 164 lb (74.4 kg)      Physical Exam Vitals reviewed.  Constitutional:      General: He is not in acute distress.    Appearance: Normal appearance. He is well-developed. He is ill-appearing.  HENT:     Head: Normocephalic and atraumatic.  Eyes:     Conjunctiva/sclera: Conjunctivae normal.  Pulmonary:     Effort: Pulmonary effort is normal. No respiratory distress (able to speak  in ful sentences without issue).  Musculoskeletal:     Cervical back: Normal range of motion and neck supple.  Neurological:     Mental Status: He is alert.  Psychiatric:        Mood and Affect: Mood normal.        Behavior: Behavior normal.        Thought Content: Thought content normal.        Judgment: Judgment normal.       Assessment & Plan     1. COVID-19 - Tested positive this morning (01/07/21) on at home test - Continue symptomatic management OTC of choice - Push fluids - Rest as needed - Referral for treatment considerations placed - Call or follow up with PCP if symptoms worsen - Seek UC/ER if difficulty breathing or significant SOB develops - Ambulatory referral for Covid Treatment   No follow-ups on file.     I discussed the assessment and treatment plan with the patient. The patient was provided an opportunity to ask questions and all were answered. The patient agreed with the plan and demonstrated an understanding of the instructions.   The patient was advised to call back or seek an in-person evaluation if the symptoms worsen or if the condition fails to improve as anticipated.  I provided 18  minutes of face-to-face time during this encounter via MyChart Video enabled encounter.   [place] [phone] (phone) [phone] (fax)  [place]

## 2021-01-08 ENCOUNTER — Telehealth: Payer: Self-pay | Admitting: Unknown Physician Specialty

## 2021-01-08 MED ORDER — NIRMATRELVIR/RITONAVIR (PAXLOVID)TABLET: 3.0000 | ORAL_TABLET | Freq: Two times a day (BID) | ORAL | 0 refills | Status: AC

## 2021-01-08 NOTE — Telephone Encounter (Signed)
Called to discuss with patient about COVID-19 symptoms and the use of one of the available treatments for those with mild to moderate Covid symptoms and at a high risk of hospitalization.  Pt appears to qualify for outpatient treatment due to co-morbid conditions and/or a member of an at-risk group in accordance with the FDA Emergency Use Authorization.    Symptom onset: ? Vaccinated: yes Booster? yes  Unable to reach pt - [place]

## 2021-01-08 NOTE — Telephone Encounter (Signed)
Outpatient Oral COVID Treatment Note  I connected with Holley Bouche on 01/08/2021/1:06 PM by telephone and verified that I am speaking with the correct person using two identifiers.  I discussed the limitations, risks, security, and privacy concerns of performing an evaluation and management service by telephone and the availability of in person appointments. I also discussed with the patient that there may be a patient responsible charge related to this service. The patient expressed understanding and agreed to proceed.  Patient location: home Provider location: home  Diagnosis: COVID-19 infection  Purpose of visit: Discussion of potential use of Molnupiravir or Paxlovid, a new treatment for mild to moderate COVID-19 viral infection in non-hospitalized patients.   Subjective: Patient is a 40 y.o. male who has been diagnosed with COVID 19 viral infection.  Their symptoms began on 4/23 with chills .    Past Medical History:  Diagnosis Date  . ADHD (attention deficit hyperactivity disorder)   . Genital warts   . Hyperlipidemia   . Learning disability   . Testicular cancer ([place]) 1997    Allergies  Allergen Reactions  . Doxycycline     hives     Current Outpatient Medications:  .  amphetamine-dextroamphetamine (ADDERALL) 20 MG tablet, Take 1 tablet (20 mg total) by mouth 2 (two) times daily., Disp: 60 tablet, Rfl: 0 .  aspirin 81 MG tablet, Take 81 mg by mouth daily., Disp: , Rfl:  .  atorvastatin (LIPITOR) 40 MG tablet, TAKE 1 TABLET DAILY, Disp: 90 tablet, Rfl: 3 .  DRYSOL 20 % external solution, APPLY EXTERNALLY TO THE AFFECTED AREA DAILY, Disp: 60 mL, Rfl: 0 .  Fenofibrate 150 MG CAPS, Take 1 capsule (150 mg total) by mouth daily., Disp: 90 capsule, Rfl: 3 .  fluticasone (FLONASE) 50 MCG/ACT nasal spray, SHAKE LIQUID AND USE 2 SPRAYS IN EACH NOSTRIL DAILY, Disp: 16 g, Rfl: 11 .  levocetirizine (XYZAL) 5 MG tablet, TAKE 1 TABLET(5 MG) BY MOUTH EVERY EVENING, Disp: 30 tablet,  Rfl: 11 .  niacin (NIASPAN) 1000 MG CR tablet, Take 1 tablet (1,000 mg total) by mouth at bedtime., Disp: 90 tablet, Rfl: 3 .  nicotine (NICODERM CQ) 14 mg/24hr patch, Place 1 patch (14 mg total) onto the skin daily. (Patient not taking: Reported on 10/18/2020), Disp: 28 patch, Rfl: 2 .  Omega-3 Fatty Acids (FISH OIL PO), Take by mouth daily., Disp: , Rfl:  .  SUMAtriptan (IMITREX) 100 MG tablet, Take 1 tablet (100 mg total) by mouth as needed for headache. May repeat in 2 hours if headache persists or recurs., Disp: 10 tablet, Rfl: 5 .  temazepam (RESTORIL) 30 MG capsule, TAKE 1 CAPSULE BY MOUTH EVERY DAY AT BEDTIME AS NEEDED FOR SLEEP, Disp: 30 capsule, Rfl: 5  Objective: Patient appears/sounds congested.  They are in no apparent distress.  Breathing is non labored.  Mood and behavior are normal.  Laboratory Data:  Recent Results (from the past 2160 hour(s))  T4, free     Status: None   Collection Time: 10/18/20 10:32 AM  Result Value Ref Range   Free T4 1.00 0.60 - 1.60 ng/dL    Comment: Specimens from patients who are undergoing biotin therapy and /or ingesting biotin supplements may contain high levels of biotin.  The higher biotin concentration in these specimens interferes with this Free T4 assay.  Specimens that contain high levels  of biotin may cause false high results for this Free T4 assay.  Please interpret results in light of the total  clinical presentation of the patient.    T3, free     Status: None   Collection Time: 10/18/20 10:32 AM  Result Value Ref Range   T3, Free 3.0 2.3 - 4.2 pg/mL  CBC with Differential/Platelet     Status: None   Collection Time: 10/18/20 10:32 AM  Result Value Ref Range   WBC 5.8 4.0 - 10.5 K/uL   RBC 4.80 4.22 - 5.81 Mil/uL   Hemoglobin 15.9 13.0 - 17.0 g/dL   HCT 45.7 39.0 - 52.0 %   MCV 95.3 78.0 - 100.0 fl   MCHC 34.7 30.0 - 36.0 g/dL   RDW 13.0 11.5 - 15.5 %   Platelets 344.0 150.0 - 400.0 K/uL   Neutrophils Relative % 56.3 43.0 - 77.0 %    Lymphocytes Relative 29.6 12.0 - 46.0 %   Monocytes Relative 8.2 3.0 - 12.0 %   Eosinophils Relative 3.8 0.0 - 5.0 %   Basophils Relative 2.1 0.0 - 3.0 %   Neutro Abs 3.3 1.4 - 7.7 K/uL   Lymphs Abs 1.7 0.7 - 4.0 K/uL   Monocytes Absolute 0.5 0.1 - 1.0 K/uL   Eosinophils Absolute 0.2 0.0 - 0.7 K/uL   Basophils Absolute 0.1 0.0 - 0.1 K/uL  Basic metabolic panel     Status: None   Collection Time: 10/18/20 10:32 AM  Result Value Ref Range   Sodium 138 135 - 145 mEq/L   Potassium 4.8 3.5 - 5.1 mEq/L   Chloride 103 96 - 112 mEq/L   CO2 29 19 - 32 mEq/L   Glucose, Bld 86 70 - 99 mg/dL   BUN 9 6 - 23 mg/dL   Creatinine, Ser 0.84 0.40 - 1.50 mg/dL   GFR 107.95 >60.00 mL/min    Comment: Calculated using the CKD-EPI Creatinine Equation (2021)   Calcium 9.8 8.4 - 10.5 mg/dL  TSH     Status: None   Collection Time: 10/18/20 10:32 AM  Result Value Ref Range   TSH 2.83 0.35 - 4.50 uIU/mL  Hepatic function panel     Status: Abnormal   Collection Time: 10/18/20 10:32 AM  Result Value Ref Range   Total Bilirubin 0.8 0.2 - 1.2 mg/dL   Bilirubin, Direct 0.2 0.0 - 0.3 mg/dL   Alkaline Phosphatase 29 (L) 39 - 117 U/L   AST 23 0 - 37 U/L   ALT 28 0 - 53 U/L   Total Protein 6.7 6.0 - 8.3 g/dL   Albumin 4.6 3.5 - 5.2 g/dL  Lipid panel     Status: None   Collection Time: 10/18/20 10:32 AM  Result Value Ref Range   Cholesterol 123 0 - 200 mg/dL    Comment: ATP III Classification       Desirable:  < 200 mg/dL               Borderline High:  200 - 239 mg/dL          High:  > = 240 mg/dL   Triglycerides 148.0 0.0 - 149.0 mg/dL    Comment: Normal:  <150 mg/dLBorderline High:  150 - 199 mg/dL   HDL 47.30 >39.00 mg/dL   VLDL 29.6 0.0 - 40.0 mg/dL   LDL Cholesterol 46 0 - 99 mg/dL   Total CHOL/HDL Ratio 3     Comment:                Men          Women1/2 Average Risk  3.4          3.3Average Risk          5.0          4.42X Average Risk          9.6          7.13X Average Risk          15.0           11.0                       NonHDL 75.53     Comment: NOTE:  Non-HDL goal should be 30 mg/dL higher than patient's LDL goal (i.e. LDL goal of < 70 mg/dL, would have non-HDL goal of < 100 mg/dL)     Assessment: 40 y.o. male with mild/moderate COVID 19 viral infection diagnosed on 4/23 at high risk for progression to severe COVID 19.  Plan:  This patient is a 40 y.o. male that meets the following criteria for Emergency Use Authorization of: Paxlovid 1. Age >12 yr AND > 40 kg 2. SARS-COV-2 positive test 3. Symptom onset < 5 days 4. Mild-to-moderate COVID disease with high risk for severe progression to hospitalization or death  I have spoken and communicated the following to the patient or parent/caregiver regarding: 1. Paxlovid is an unapproved drug that is authorized for use under an Emergency Use Authorization.  2. There are no adequate, approved, available products for the treatment of COVID-19 in adults who have mild-to-moderate COVID-19 and are at high risk for progressing to severe COVID-19, including hospitalization or death. 3. Other therapeutics are currently authorized. For additional information on all products authorized for treatment or prevention of COVID-19, please see [email or web address].  4. There are benefits and risks of taking this treatment as outlined in the "Fact Sheet for Patients and Caregivers."  5. "Fact Sheet for Patients and Caregivers" was reviewed with patient. A hard copy will be provided to patient from pharmacy prior to the patient receiving treatment. 6. Patients should continue to self-isolate and use infection control measures (e.g., wear mask, isolate, social distance, avoid sharing personal items, clean and disinfect "high touch" surfaces, and frequent handwashing) according to CDC guidelines.  7. The patient or parent/caregiver has the option to accept  or refuse treatment. 8. Patient medication history was reviewed for potential drug interactions:No drug interactions 9. Patient's GFR was calculated to be 107, and they were therefore prescribed Normal dose (GFR>60) - nirmatrelvir 150mg  tab (2 tablet) by mouth twice daily AND ritonavir 100mg  tab (1 tablet) by mouth twice daily   After reviewing above information with the patient, the patient agrees to receive Paxlovid.  Follow up instructions:    . Take prescription BID x 5 days as directed . Reach out to pharmacist for counseling on medication if desired . For concerns regarding further COVID symptoms please follow up with your PCP or urgent care . For urgent or life-threatening issues, seek care at your local emergency department  The patient was provided an opportunity to ask questions, and all were answered. The patient agreed with the plan and demonstrated an understanding of the instructions.   Script sent to [facility]  The patient was advised to call their PCP or seek an in-person evaluation if the symptoms worsen or if the condition fails to improve as anticipated.   I provided 15 minutes of non face-to-face telephone visit time during this encounter, and > 50% was spent counseling as documented under  my assessment & plan.  Kathrine Haddock, NP 01/08/2021 Theadore Nan PM  Will hold Atorvastatin and Fluticasone

## 2021-01-24 ENCOUNTER — Encounter: Payer: Self-pay | Admitting: Family Medicine

## 2021-01-24 MED ORDER — AMPHETAMINE-DEXTROAMPHETAMINE 20 MG PO TABS: 20.0000 mg | ORAL_TABLET | Freq: Two times a day (BID) | ORAL | 0 refills | Status: DC

## 2021-01-24 NOTE — Telephone Encounter (Signed)
Done

## 2021-02-19 ENCOUNTER — Other Ambulatory Visit: Payer: Self-pay | Admitting: Family Medicine

## 2021-03-21 ENCOUNTER — Other Ambulatory Visit: Payer: Self-pay | Admitting: Family Medicine

## 2021-03-29 ENCOUNTER — Other Ambulatory Visit: Payer: Self-pay

## 2021-03-30 ENCOUNTER — Encounter: Payer: Self-pay | Admitting: Family Medicine

## 2021-03-30 ENCOUNTER — Ambulatory Visit: Payer: BC Managed Care – PPO | Admitting: Family Medicine

## 2021-03-30 VITALS — BP 110/80 | HR 78 | Temp 97.9°F | Wt 163.0 lb

## 2021-03-30 DIAGNOSIS — K219 Gastro-esophageal reflux disease without esophagitis: Secondary | ICD-10-CM | POA: Diagnosis not present

## 2021-03-30 MED ORDER — OMEPRAZOLE 40 MG PO CPDR: 40.0000 mg | DELAYED_RELEASE_CAPSULE | Freq: Every day | ORAL | 3 refills | Status: DC

## 2021-03-30 NOTE — Progress Notes (Signed)
   Subjective:    Patient ID: Jordan Robles, male    DOB: [birth year], 40 y.o.   MRN: [id number]  HPI Here for frequent heartburn. No trouble swallowing. His BMs are regular. He gets good relief with TUMS, but this does not last long. He took Prilosec about 10 years ago.    Review of Systems  Constitutional: Negative.   Respiratory: Negative.    Cardiovascular: Negative.   Gastrointestinal:  Negative for abdominal distention, abdominal pain, blood in stool, constipation, diarrhea and nausea.      Objective:   Physical Exam Constitutional:      Appearance: Normal appearance.  Cardiovascular:     Rate and Rhythm: Normal rate and regular rhythm.     Pulses: Normal pulses.     Heart sounds: Normal heart sounds.  Pulmonary:     Effort: Pulmonary effort is normal.     Breath sounds: Normal breath sounds.  Abdominal:     General: Abdomen is flat. Bowel sounds are normal. There is no distension.     Palpations: Abdomen is soft. There is no mass.     Tenderness: There is no abdominal tenderness. There is no guarding or rebound.     Hernia: No hernia is present.  Neurological:     Mental Status: He is alert.          Assessment & Plan:  GERD, treat with Omeprazoel 40 mg every day. Alysia Penna, MD

## 2021-04-24 ENCOUNTER — Other Ambulatory Visit: Payer: Self-pay | Admitting: Family Medicine

## 2021-04-25 MED ORDER — AMPHETAMINE-DEXTROAMPHETAMINE 20 MG PO TABS: 20.0000 mg | ORAL_TABLET | Freq: Two times a day (BID) | ORAL | 0 refills | Status: DC

## 2021-04-25 NOTE — Telephone Encounter (Signed)
Pt LOV was on 03/30/2021 Last refill was done on 03/27/2021 Please advise

## 2021-04-25 NOTE — Telephone Encounter (Signed)
Done

## 2021-06-27 ENCOUNTER — Encounter: Payer: Self-pay | Admitting: Family Medicine

## 2021-06-27 MED ORDER — TEMAZEPAM 30 MG PO CAPS: ORAL_CAPSULE | ORAL | 5 refills | Status: DC

## 2021-06-27 NOTE — Telephone Encounter (Signed)
I did the refill  

## 2021-07-23 ENCOUNTER — Other Ambulatory Visit: Payer: Self-pay | Admitting: Family Medicine

## 2021-07-24 MED ORDER — AMPHETAMINE-DEXTROAMPHETAMINE 20 MG PO TABS: 20.0000 mg | ORAL_TABLET | Freq: Two times a day (BID) | ORAL | 0 refills | Status: DC

## 2021-07-24 NOTE — Telephone Encounter (Signed)
Done

## 2021-08-19 ENCOUNTER — Other Ambulatory Visit: Payer: Self-pay | Admitting: Family Medicine

## 2021-08-19 DIAGNOSIS — J310 Chronic rhinitis: Secondary | ICD-10-CM

## 2021-09-05 ENCOUNTER — Other Ambulatory Visit: Payer: Self-pay | Admitting: Family Medicine

## 2021-10-18 ENCOUNTER — Encounter: Payer: Self-pay | Admitting: Family Medicine

## 2021-10-18 ENCOUNTER — Ambulatory Visit (INDEPENDENT_AMBULATORY_CARE_PROVIDER_SITE_OTHER): Payer: BC Managed Care – PPO | Admitting: Family Medicine

## 2021-10-18 VITALS — BP 126/88 | HR 86 | Temp 98.5°F | Ht 68.0 in | Wt 170.0 lb

## 2021-10-18 DIAGNOSIS — Z0000 Encounter for general adult medical examination without abnormal findings: Secondary | ICD-10-CM | POA: Diagnosis not present

## 2021-10-18 LAB — BASIC METABOLIC PANEL
BUN: 12 mg/dL (ref 6–23)
CO2: 31 mEq/L (ref 19–32)
Calcium: 10.2 mg/dL (ref 8.4–10.5)
Chloride: 99 mEq/L (ref 96–112)
Creatinine, Ser: 1.04 mg/dL (ref 0.40–1.50)
GFR: 88.26 mL/min (ref >60.00)
Glucose, Bld: 94 mg/dL (ref 70–99)
Potassium: 4.8 mEq/L (ref 3.5–5.1)
Sodium: 138 mEq/L (ref 135–145)

## 2021-10-18 LAB — CBC WITH DIFFERENTIAL/PLATELET
Basophils Absolute: 0.1 10*3/uL (ref 0.0–0.1)
Basophils Relative: 0.9 % (ref 0.0–3.0)
Eosinophils Absolute: 0.1 10*3/uL (ref 0.0–0.7)
Eosinophils Relative: 2 % (ref 0.0–5.0)
HCT: 49.8 % (ref 39.0–52.0)
Hemoglobin: 16.5 g/dL (ref 13.0–17.0)
Lymphocytes Relative: 29.9 % (ref 12.0–46.0)
Lymphs Abs: 1.9 10*3/uL (ref 0.7–4.0)
MCHC: 33.1 g/dL (ref 30.0–36.0)
MCV: 95.2 fl (ref 78.0–100.0)
Monocytes Absolute: 0.5 10*3/uL (ref 0.1–1.0)
Monocytes Relative: 8.5 % (ref 3.0–12.0)
Neutro Abs: 3.7 10*3/uL (ref 1.4–7.7)
Neutrophils Relative %: 58.7 % (ref 43.0–77.0)
Platelets: 368 10*3/uL (ref 150.0–400.0)
RBC: 5.24 Mil/uL (ref 4.22–5.81)
RDW: 13.4 % (ref 11.5–15.5)
WBC: 6.4 10*3/uL (ref 4.0–10.5)

## 2021-10-18 LAB — HEPATIC FUNCTION PANEL
ALT: 24 U/L (ref 0–53)
AST: 24 U/L (ref 0–37)
Albumin: 5 g/dL (ref 3.5–5.2)
Alkaline Phosphatase: 26 U/L — ABNORMAL LOW (ref 39–117)
Bilirubin, Direct: 0.1 mg/dL (ref 0.0–0.3)
Total Bilirubin: 0.7 mg/dL (ref 0.2–1.2)
Total Protein: 7.3 g/dL (ref 6.0–8.3)

## 2021-10-18 LAB — LIPID PANEL
Cholesterol: 176 mg/dL (ref 0–200)
HDL: 45.9 mg/dL
NonHDL: 130.28
Total CHOL/HDL Ratio: 4
Triglycerides: 214 mg/dL — ABNORMAL HIGH (ref 0.0–149.0)
VLDL: 42.8 mg/dL — ABNORMAL HIGH (ref 0.0–40.0)

## 2021-10-18 LAB — HEMOGLOBIN A1C: Hgb A1c MFr Bld: 5.3 % (ref 4.6–6.5)

## 2021-10-18 LAB — TSH: TSH: 3 u[IU]/mL (ref 0.35–5.50)

## 2021-10-18 LAB — LDL CHOLESTEROL, DIRECT: Direct LDL: 104 mg/dL

## 2021-10-18 MED ORDER — AMPHETAMINE-DEXTROAMPHETAMINE 20 MG PO TABS: 20.0000 mg | ORAL_TABLET | Freq: Two times a day (BID) | ORAL | 0 refills | Status: DC

## 2021-10-18 NOTE — Progress Notes (Signed)
° °  Subjective:    Patient ID: Jordan Robles, male    DOB: 1981-07-24, 41 y.o.   MRN: [id number]  HPI Here for a well exam. He feels fine.    Review of Systems  Constitutional: Negative.   HENT: Negative.    Eyes: Negative.   Respiratory: Negative.    Cardiovascular: Negative.   Gastrointestinal: Negative.   Genitourinary: Negative.   Musculoskeletal: Negative.   Skin: Negative.   Neurological: Negative.   Psychiatric/Behavioral: Negative.        Objective:   Physical Exam Constitutional:      General: He is not in acute distress.    Appearance: Normal appearance. He is well-developed. He is not diaphoretic.  HENT:     Head: Normocephalic and atraumatic.     Right Ear: External ear normal.     Left Ear: External ear normal.     Nose: Nose normal.     Mouth/Throat:     Pharynx: No oropharyngeal exudate.  Eyes:     General: No scleral icterus.       Right eye: No discharge.        Left eye: No discharge.     Conjunctiva/sclera: Conjunctivae normal.     Pupils: Pupils are equal, round, and reactive to light.  Neck:     Thyroid: No thyromegaly.     Vascular: No JVD.     Trachea: No tracheal deviation.  Cardiovascular:     Rate and Rhythm: Normal rate and regular rhythm.     Heart sounds: Normal heart sounds. No murmur heard.   No friction rub. No gallop.  Pulmonary:     Effort: Pulmonary effort is normal. No respiratory distress.     Breath sounds: Normal breath sounds. No wheezing or rales.  Chest:     Chest wall: No tenderness.  Abdominal:     General: Bowel sounds are normal. There is no distension.     Palpations: Abdomen is soft. There is no mass.     Tenderness: There is no abdominal tenderness. There is no guarding or rebound.  Genitourinary:    Penis: Normal. No tenderness.      Testes: Normal.  Musculoskeletal:        General: No tenderness. Normal range of motion.     Cervical back: Neck supple.  Lymphadenopathy:     Cervical: No cervical  adenopathy.  Skin:    General: Skin is warm and dry.     Coloration: Skin is not pale.     Findings: No erythema or rash.  Neurological:     Mental Status: He is alert and oriented to person, place, and time.     Cranial Nerves: No cranial nerve deficit.     Motor: No abnormal muscle tone.     Coordination: Coordination normal.     Deep Tendon Reflexes: Reflexes are normal and symmetric. Reflexes normal.  Psychiatric:        Behavior: Behavior normal.        Thought Content: Thought content normal.        Judgment: Judgment normal.          Assessment & Plan:  Well exam. We discussed diet and exercise. Get fasting labs. Alysia Penna, MD

## 2021-12-16 ENCOUNTER — Other Ambulatory Visit: Payer: Self-pay | Admitting: Family Medicine

## 2022-01-14 ENCOUNTER — Encounter: Payer: Self-pay | Admitting: Family Medicine

## 2022-01-14 ENCOUNTER — Other Ambulatory Visit: Payer: Self-pay | Admitting: Family Medicine

## 2022-01-15 NOTE — Telephone Encounter (Signed)
I agree the Omeprazole could be causing the diarrhea so stop it. Instead he can try OTC Pepcid 20 mg daily. For the anxiety, he should make an OV to discuss this  ?

## 2022-01-15 NOTE — Telephone Encounter (Signed)
Last OV- 10/18/21 ?Last refill- 12/21/21- 60 tabs, 0 refills ? ?No future OV scheduled. ? ? ?

## 2022-01-16 MED ORDER — AMPHETAMINE-DEXTROAMPHETAMINE 20 MG PO TABS: 20.0000 mg | ORAL_TABLET | Freq: Two times a day (BID) | ORAL | 0 refills | Status: DC

## 2022-01-16 NOTE — Telephone Encounter (Signed)
Done

## 2022-01-17 ENCOUNTER — Telehealth: Payer: BC Managed Care – PPO | Admitting: Family Medicine

## 2022-01-17 ENCOUNTER — Encounter: Payer: Self-pay | Admitting: Family Medicine

## 2022-01-17 DIAGNOSIS — F418 Other specified anxiety disorders: Secondary | ICD-10-CM | POA: Diagnosis not present

## 2022-01-17 MED ORDER — ALPRAZOLAM 0.25 MG PO TABS: 0.2500 mg | ORAL_TABLET | Freq: Two times a day (BID) | ORAL | 0 refills | Status: DC | PRN

## 2022-01-17 MED ORDER — SERTRALINE HCL 50 MG PO TABS: 50.0000 mg | ORAL_TABLET | Freq: Every day | ORAL | 2 refills | Status: DC

## 2022-01-17 NOTE — Progress Notes (Signed)
? ?Subjective:  ? ? Patient ID: Jordan Robles, male    DOB: Feb 15, 1981, 41 y.o.   MRN: [id number] ? ?HPI ?Virtual Visit via Video Note ? ?I connected with the patient on 01/17/22 at  1:15 PM EDT by a video enabled telemedicine application and verified that I am speaking with the correct person using two identifiers. ? Location patient: home ?Location provider:work or home office ?Persons participating in the virtual visit: patient, provider ? ?I discussed the limitations of evaluation and management by telemedicine and the availability of in person appointments. The patient expressed understanding and agreed to proceed. ? ? ?HPI: ?Here asking for help with anxiety. Over the past 2 months he has felt anxiety build up inside him to the point that he now feels bad and he is frightened. He has a lot of job stresses, and he says he has a lot of unresolved issues from his childhood that have been on his mind. He saw a therapist for awhile when he was in his early 20's and he found it helpful. He has never been prescribed a medication for this, but he admits to taking a few of his wife's Xanax pills recently and they were very helpful. He has trouble sleeping and he is irritable. He a lack of motivation to do things that he would normally enjoy. He has been getting behind in his job work because he finds it hard to concentrate. He denies any thoughts about harming himself.  ? ? ?ROS: See pertinent positives and negatives per HPI. ? ?Past Medical History:  ?Diagnosis Date  ? ADHD (attention deficit hyperactivity disorder)   ? Genital warts   ? Hyperlipidemia   ? Learning disability   ? Testicular cancer ([place]) 1997  ? ? ?Past Surgical History:  ?Procedure Laterality Date  ? left orchiectomy  1997  ? ? ?Family History  ?Problem Relation Age of Onset  ? Hyperlipidemia Other   ? Hypertension Other   ? ? ? ?Current Outpatient Medications:  ?  ALPRAZolam (XANAX) 0.25 MG tablet, Take 1 tablet (0.25 mg total) by mouth 2 (two)  times daily as needed for anxiety., Disp: 60 tablet, Rfl: 0 ?  [START ON 03/18/2022] amphetamine-dextroamphetamine (ADDERALL) 20 MG tablet, Take 1 tablet (20 mg total) by mouth 2 (two) times daily., Disp: 60 tablet, Rfl: 0 ?  atorvastatin (LIPITOR) 40 MG tablet, TAKE 1 TABLET BY MOUTH DAILY, Disp: 90 tablet, Rfl: 0 ?  DRYSOL 20 % external solution, APPLY EXTERNALLY TO THE AFFECTED AREA DAILY, Disp: 60 mL, Rfl: 0 ?  famotidine (PEPCID) 20 MG tablet, Take 20 mg by mouth daily. OTC, Disp: , Rfl:  ?  fenofibrate 160 MG tablet, TAKE 1 TABLET(160 MG) BY MOUTH DAILY, Disp: 90 tablet, Rfl: 3 ?  fluticasone (FLONASE) 50 MCG/ACT nasal spray, SHAKE LIQUID AND USE 2 SPRAYS IN EACH NOSTRIL DAILY, Disp: 16 g, Rfl: 5 ?  levocetirizine (XYZAL) 5 MG tablet, TAKE 1 TABLET(5 MG) BY MOUTH EVERY EVENING, Disp: 30 tablet, Rfl: 11 ?  niacin (NIASPAN) 1000 MG CR tablet, TAKE 1 TABLET(1000 MG) BY MOUTH AT BEDTIME, Disp: 90 tablet, Rfl: 1 ?  Omega-3 Fatty Acids (FISH OIL PO), Take by mouth daily., Disp: , Rfl:  ?  sertraline (ZOLOFT) 50 MG tablet, Take 1 tablet (50 mg total) by mouth daily., Disp: 30 tablet, Rfl: 2 ?  SUMAtriptan (IMITREX) 100 MG tablet, Take 1 tablet (100 mg total) by mouth as needed for headache. May repeat in 2 hours if headache  persists or recurs., Disp: 10 tablet, Rfl: 5 ?  temazepam (RESTORIL) 30 MG capsule, TAKE 1 CAPSULE BY MOUTH EVERY DAY AT BEDTIME AS NEEDED FOR SLEEP, Disp: 30 capsule, Rfl: 5 ? ?EXAM: ? ?VITALS per patient if applicable: ? ?GENERAL: alert, oriented, appears well and in no acute distress ? ?HEENT: atraumatic, conjunttiva clear, no obvious abnormalities on inspection of external nose and ears ? ?NECK: normal movements of the head and neck ? ?LUNGS: on inspection no signs of respiratory distress, breathing rate appears normal, no obvious gross SOB, gasping or wheezing ? ?CV: no obvious cyanosis ? ?MS: moves all visible extremities without noticeable abnormality ? ?PSYCH/NEURO: pleasant and cooperative,  no obvious depression or anxiety, speech and thought processing grossly intact ? ?ASSESSMENT AND PLAN: ?He has anxiety with depression and we will treat this with Zoloft 50 mg daily. He will also have a few Xanax to take as needed. I urged him to also contact a therapist, and he agreed to do so. We will have a follow up visit in 3-4 weeks.  ?Alysia Penna, MD ? ?Discussed the following assessment and plan: ? ?No diagnosis found. ? ? ?  ?I discussed the assessment and treatment plan with the patient. The patient was provided an opportunity to ask questions and all were answered. The patient agreed with the plan and demonstrated an understanding of the instructions. ?  ?The patient was advised to call back or seek an in-person evaluation if the symptoms worsen or if the condition fails to improve as anticipated. ? ?  ? ? ?Review of Systems ? ?   ?Objective:  ? Physical Exam ? ? ? ? ?   ?Assessment & Plan:  ? ? ?

## 2022-02-07 ENCOUNTER — Encounter: Payer: Self-pay | Admitting: Family Medicine

## 2022-02-07 ENCOUNTER — Telehealth: Payer: BC Managed Care – PPO | Admitting: Family Medicine

## 2022-02-07 ENCOUNTER — Telehealth: Payer: Self-pay | Admitting: Family Medicine

## 2022-02-07 DIAGNOSIS — F900 Attention-deficit hyperactivity disorder, predominantly inattentive type: Secondary | ICD-10-CM

## 2022-02-07 DIAGNOSIS — F418 Other specified anxiety disorders: Secondary | ICD-10-CM

## 2022-02-07 MED ORDER — ALPRAZOLAM 0.5 MG PO TABS: 0.5000 mg | ORAL_TABLET | Freq: Two times a day (BID) | ORAL | 2 refills | Status: DC | PRN

## 2022-02-07 MED ORDER — SERTRALINE HCL 100 MG PO TABS: 100.0000 mg | ORAL_TABLET | Freq: Every day | ORAL | 2 refills | Status: DC

## 2022-02-07 MED ORDER — AMPHETAMINE-DEXTROAMPHETAMINE 10 MG PO TABS: 10.0000 mg | ORAL_TABLET | Freq: Two times a day (BID) | ORAL | 0 refills | Status: DC

## 2022-02-07 NOTE — Telephone Encounter (Signed)
Wants to know if there was a change in dosage, should patient stop taking the original prescription. needs clarification on ALPRAZolam (XANAX) 0.5 MG tablet and amphetamine-dextroamphetamine (ADDERALL) 10 MG tablet

## 2022-02-07 NOTE — Progress Notes (Signed)
Subjective:    Patient ID: Jordan Robles, male    DOB: 1980/12/25, 41 y.o.   MRN: [id number]  HPI Virtual Visit via Video Note  I connected with the patient on 02/07/22 at  8:30 AM EDT by a video enabled telemedicine application and verified that I am speaking with the correct person using two identifiers.  Location patient: home Location provider:work or home office Persons participating in the virtual visit: patient, provider  I discussed the limitations of evaluation and management by telemedicine and the availability of in person appointments. The patient expressed understanding and agreed to proceed.   HPI: Here to discuss several issues. 3 weeks ago we talked about his depression and anxiety, and he has been taking Zoloft 50 mg daily and Xanax 0.25 mg as needed. He has seen definite improvements in his symptoms. He feels less anxious and his moods are better. He is less irritable, and he can focus on his job more easily. He asks to increase the dose of Zoloft however, and he has been taking two Xanax tablets at a time for better results. He also asks my advice about choosing a therapist to talk to. Otherwise his ADHD has been doing well, and in fact he asks to decrease the dose of his Adderall. This has been making him jittery and it takes away his appetite.    ROS: See pertinent positives and negatives per HPI.  Past Medical History:  Diagnosis Date   ADHD (attention deficit hyperactivity disorder)    Genital warts    Hyperlipidemia    Learning disability    Testicular cancer ([place]) 1997    Past Surgical History:  Procedure Laterality Date   left orchiectomy  1997    Family History  Problem Relation Age of Onset   Hyperlipidemia Other    Hypertension Other      Current Outpatient Medications:    ALPRAZolam (XANAX) 0.5 MG tablet, Take 1 tablet (0.5 mg total) by mouth 2 (two) times daily as needed for anxiety., Disp: 60 tablet, Rfl: 2   atorvastatin (LIPITOR) 40 MG  tablet, TAKE 1 TABLET BY MOUTH DAILY, Disp: 90 tablet, Rfl: 0   DRYSOL 20 % external solution, APPLY EXTERNALLY TO THE AFFECTED AREA DAILY, Disp: 60 mL, Rfl: 0   famotidine (PEPCID) 20 MG tablet, Take 20 mg by mouth daily. OTC, Disp: , Rfl:    fenofibrate 160 MG tablet, TAKE 1 TABLET(160 MG) BY MOUTH DAILY, Disp: 90 tablet, Rfl: 3   fluticasone (FLONASE) 50 MCG/ACT nasal spray, SHAKE LIQUID AND USE 2 SPRAYS IN EACH NOSTRIL DAILY, Disp: 16 g, Rfl: 5   levocetirizine (XYZAL) 5 MG tablet, TAKE 1 TABLET(5 MG) BY MOUTH EVERY EVENING, Disp: 30 tablet, Rfl: 11   niacin (NIASPAN) 1000 MG CR tablet, TAKE 1 TABLET(1000 MG) BY MOUTH AT BEDTIME, Disp: 90 tablet, Rfl: 1   Omega-3 Fatty Acids (FISH OIL PO), Take by mouth daily., Disp: , Rfl:    sertraline (ZOLOFT) 100 MG tablet, Take 1 tablet (100 mg total) by mouth daily., Disp: 30 tablet, Rfl: 2   SUMAtriptan (IMITREX) 100 MG tablet, Take 1 tablet (100 mg total) by mouth as needed for headache. May repeat in 2 hours if headache persists or recurs., Disp: 10 tablet, Rfl: 5   temazepam (RESTORIL) 30 MG capsule, TAKE 1 CAPSULE BY MOUTH EVERY DAY AT BEDTIME AS NEEDED FOR SLEEP, Disp: 30 capsule, Rfl: 5   [START ON 04/09/2022] amphetamine-dextroamphetamine (ADDERALL) 10 MG tablet, Take 1 tablet (10  mg total) by mouth 2 (two) times daily., Disp: 60 tablet, Rfl: 0  EXAM:  VITALS per patient if applicable:  GENERAL: alert, oriented, appears well and in no acute distress  HEENT: atraumatic, conjunttiva clear, no obvious abnormalities on inspection of external nose and ears  NECK: normal movements of the head and neck  LUNGS: on inspection no signs of respiratory distress, breathing rate appears normal, no obvious gross SOB, gasping or wheezing  CV: no obvious cyanosis  MS: moves all visible extremities without noticeable abnormality  PSYCH/NEURO: pleasant and cooperative, no obvious depression or anxiety, speech and thought processing grossly  intact  ASSESSMENT AND PLAN: His depression with anxiety has improved, but we will increase the Zoloft to 100 mg daily and we will incerease the Xanax to 0.5 mg as needed. For the ADHD we will decrease the Adderall to 10 mg BID. I gave him contact information for [facility] so he can meet a therapist. Follow up in 3 months. Alysia Penna, MD  Discussed the following assessment and plan:  Attention deficit hyperactivity disorder (ADHD), predominantly inattentive type  Depression with anxiety     I discussed the assessment and treatment plan with the patient. The patient was provided an opportunity to ask questions and all were answered. The patient agreed with the plan and demonstrated an understanding of the instructions.   The patient was advised to call back or seek an in-person evaluation if the symptoms worsen or if the condition fails to improve as anticipated.      Review of Systems     Objective:   Physical Exam        Assessment & Plan:

## 2022-02-07 NOTE — Telephone Encounter (Signed)
Patient was previously prescribed Adderall '20mg'$ , 1 tab BID.   Please advise.

## 2022-02-07 NOTE — Telephone Encounter (Signed)
Spoke with Rob, Software engineer at [employer] and informed him of the message below.

## 2022-02-07 NOTE — Telephone Encounter (Signed)
These are the correct dosages. We agreed to double the Xanax dose and to cut the Adderall by half

## 2022-02-17 ENCOUNTER — Other Ambulatory Visit: Payer: Self-pay | Admitting: Family Medicine

## 2022-02-19 NOTE — Telephone Encounter (Signed)
Last VV- 02/07/22  No future OV scheduled.

## 2022-03-18 ENCOUNTER — Other Ambulatory Visit: Payer: Self-pay | Admitting: Family Medicine

## 2022-04-23 ENCOUNTER — Other Ambulatory Visit: Payer: Self-pay | Admitting: Family Medicine

## 2022-04-23 DIAGNOSIS — J310 Chronic rhinitis: Secondary | ICD-10-CM

## 2022-04-30 ENCOUNTER — Other Ambulatory Visit: Payer: Self-pay | Admitting: Family Medicine

## 2022-05-01 NOTE — Telephone Encounter (Signed)
Last VV-02/07/22 Last refill-02/07/22  No future Ov scheduled

## 2022-05-06 ENCOUNTER — Other Ambulatory Visit: Payer: Self-pay | Admitting: Family Medicine

## 2022-05-07 NOTE — Telephone Encounter (Signed)
Last VV-02/07/22 Last refill-04/09/22--60 tabs, 0 refills  No future OV scheduled.

## 2022-05-08 MED ORDER — AMPHETAMINE-DEXTROAMPHETAMINE 10 MG PO TABS: 10.0000 mg | ORAL_TABLET | Freq: Two times a day (BID) | ORAL | 0 refills | Status: DC

## 2022-05-08 NOTE — Telephone Encounter (Signed)
Done

## 2022-06-04 ENCOUNTER — Encounter: Payer: Self-pay | Admitting: Family Medicine

## 2022-06-04 ENCOUNTER — Ambulatory Visit: Payer: BC Managed Care – PPO | Admitting: Family Medicine

## 2022-06-04 VITALS — BP 150/100 | HR 93 | Temp 98.6°F | Wt 153.4 lb

## 2022-06-04 DIAGNOSIS — R1084 Generalized abdominal pain: Secondary | ICD-10-CM | POA: Diagnosis not present

## 2022-06-04 DIAGNOSIS — R634 Abnormal weight loss: Secondary | ICD-10-CM

## 2022-06-04 DIAGNOSIS — R197 Diarrhea, unspecified: Secondary | ICD-10-CM

## 2022-06-04 LAB — CBC WITH DIFFERENTIAL/PLATELET
Basophils Absolute: 0.1 10*3/uL (ref 0.0–0.1)
Basophils Relative: 1.2 % (ref 0.0–3.0)
Eosinophils Absolute: 0.1 10*3/uL (ref 0.0–0.7)
Eosinophils Relative: 1.6 % (ref 0.0–5.0)
HCT: 39.8 % (ref 39.0–52.0)
Hemoglobin: 13.7 g/dL (ref 13.0–17.0)
Lymphocytes Relative: 26.8 % (ref 12.0–46.0)
Lymphs Abs: 2 10*3/uL (ref 0.7–4.0)
MCHC: 34.5 g/dL (ref 30.0–36.0)
MCV: 97.1 fl (ref 78.0–100.0)
Monocytes Absolute: 0.6 10*3/uL (ref 0.1–1.0)
Monocytes Relative: 7.8 % (ref 3.0–12.0)
Neutro Abs: 4.6 10*3/uL (ref 1.4–7.7)
Neutrophils Relative %: 62.6 % (ref 43.0–77.0)
Platelets: 426 10*3/uL — ABNORMAL HIGH (ref 150.0–400.0)
RBC: 4.1 Mil/uL — ABNORMAL LOW (ref 4.22–5.81)
RDW: 13.5 % (ref 11.5–15.5)
WBC: 7.3 10*3/uL (ref 4.0–10.5)

## 2022-06-04 LAB — TSH: TSH: 2.76 u[IU]/mL (ref 0.35–5.50)

## 2022-06-04 LAB — SEDIMENTATION RATE: Sed Rate: 1 mm/hr (ref 0–15)

## 2022-06-04 NOTE — Progress Notes (Signed)
   Subjective:    Patient ID: TION TSE, male    DOB: 27-Mar-1981, 41 y.o.   MRN: [id number]  HPI Here for abdominal pains, diarrhea, and a weight loss of 17 lbs in the past 8 months which is not intentional. No fevers. He sometimes vomits but not often. No recent travel. He has GERD but this is well controlled with Pepcid.   Review of Systems  Constitutional:  Positive for unexpected weight change. Negative for chills, diaphoresis and fever.  Respiratory: Negative.    Cardiovascular: Negative.   Gastrointestinal:  Positive for abdominal pain, diarrhea, nausea and vomiting. Negative for abdominal distention, anal bleeding, blood in stool and constipation.  Genitourinary: Negative.        Objective:   Physical Exam Constitutional:      Comments: He has lost a lot of weight since his physical   Cardiovascular:     Rate and Rhythm: Normal rate and regular rhythm.     Pulses: Normal pulses.     Heart sounds: Normal heart sounds.  Pulmonary:     Effort: Pulmonary effort is normal.     Breath sounds: Normal breath sounds.  Abdominal:     General: Abdomen is flat. Bowel sounds are normal. There is no distension.     Palpations: There is no mass.     Tenderness: There is no guarding or rebound.     Comments: Mildly tender throughout   Neurological:     Mental Status: He is alert.           Assessment & Plan:  Here for weight loss, abdominal pains, and diarrhea. He may have inflammatory bowel disease. Refer to GI asap.  Alysia Penna, MD

## 2022-06-05 ENCOUNTER — Encounter: Payer: Self-pay | Admitting: Gastroenterology

## 2022-06-05 LAB — HEPATIC FUNCTION PANEL
ALT: 23 U/L (ref 0–53)
AST: 28 U/L (ref 0–37)
Albumin: 4.4 g/dL (ref 3.5–5.2)
Alkaline Phosphatase: 27 U/L — ABNORMAL LOW (ref 39–117)
Bilirubin, Direct: 0.1 mg/dL (ref 0.0–0.3)
Total Bilirubin: 0.7 mg/dL (ref 0.2–1.2)
Total Protein: 6.9 g/dL (ref 6.0–8.3)

## 2022-06-05 LAB — BASIC METABOLIC PANEL
BUN: 8 mg/dL (ref 6–23)
CO2: 28 mEq/L (ref 19–32)
Calcium: 9.4 mg/dL (ref 8.4–10.5)
Chloride: 102 mEq/L (ref 96–112)
Creatinine, Ser: 0.89 mg/dL (ref 0.40–1.50)
GFR: 104.87 mL/min (ref >60.00)
Glucose, Bld: 95 mg/dL (ref 70–99)
Potassium: 3.6 mEq/L (ref 3.5–5.1)
Sodium: 141 mEq/L (ref 135–145)

## 2022-06-05 LAB — LIPASE: Lipase: 39 U/L (ref 11.0–59.0)

## 2022-06-05 NOTE — Telephone Encounter (Signed)
Noted  

## 2022-06-18 ENCOUNTER — Encounter: Payer: Self-pay | Admitting: Family Medicine

## 2022-06-24 ENCOUNTER — Other Ambulatory Visit: Payer: Self-pay | Admitting: Family Medicine

## 2022-07-05 ENCOUNTER — Encounter: Payer: Self-pay | Admitting: Family Medicine

## 2022-07-06 ENCOUNTER — Ambulatory Visit: Payer: BC Managed Care – PPO | Admitting: Gastroenterology

## 2022-07-06 ENCOUNTER — Encounter: Payer: Self-pay | Admitting: Gastroenterology

## 2022-07-06 ENCOUNTER — Other Ambulatory Visit: Payer: BC Managed Care – PPO

## 2022-07-06 VITALS — BP 142/100 | HR 87 | Ht 68.0 in | Wt 154.2 lb

## 2022-07-06 DIAGNOSIS — R634 Abnormal weight loss: Secondary | ICD-10-CM

## 2022-07-06 DIAGNOSIS — R197 Diarrhea, unspecified: Secondary | ICD-10-CM | POA: Diagnosis not present

## 2022-07-06 DIAGNOSIS — R109 Unspecified abdominal pain: Secondary | ICD-10-CM | POA: Diagnosis not present

## 2022-07-06 MED ORDER — AMPHETAMINE-DEXTROAMPHETAMINE 10 MG PO TABS: 10.0000 mg | ORAL_TABLET | Freq: Two times a day (BID) | ORAL | 0 refills | Status: DC

## 2022-07-06 NOTE — Telephone Encounter (Signed)
I sent in a new refill with today's date on it

## 2022-07-06 NOTE — Progress Notes (Signed)
Referring Provider: Laurey Morale, MD Primary Care Physician:  Laurey Morale, MD   Reason for Consultation: Abdominal pain, diarrhea, and 17 pound weight loss   IMPRESSION:  57-monthhistory of abdominal pain, diarrhea, and 17 pound weight loss.  Recent labs show thrombocytosis.  These results are worrisome for inflammatory bowel disease although he is slightly older than a new diagnosis is usually made. Differential also includes stress, anxiety, as well as organic GI etiologies including pancreatic disease and malignancy.  Endoscopic evaluation recommended.  Low threshold to proceed with cross-sectional imaging to exclude other possible causes.  PLAN: - Fecal calprotectin, fecal elastase - EGD and colonoscopy - Low threshold for cross-sectional imaging if symptoms escalate prior to endoscopy of ir endoscopic evaluation is negative   HPI: Jordan MCCANTSis a 41y.o. male referred by Dr. FSarajane Jewsfor further evaluation of abdominal pain, diarrhea, and 17 pound weight loss.  The history is obtained through the patient and review of his electronic health record.  His anxiety, depression, elevated triglycerides, and a history of testicular cancer diagnosed in 1998.  He has a longstanding history of GERD but this has been well controlled on Pepcid and TUMS. Some improvement in reflux after he quit smoking over a year ago. In fact, he has been able to stop the famotidine due to infrequent symptoms.   8 months ago he developed intermittent diffuse abdominal pain/soreness with associated bloating, diarrhea and subsequent 17 pounds of unintentional weight loss. At the peak he was having 5-6 bowel movements daily with significant urgency. Symptoms are not related to eating.  No blood or mucous in the stool. Bowel habits are currently 1-2 times daily.  Feels like his body is "under attack." Some psychosocial stresses. Recently started treatment for anxiety and depression but his symptoms have not  improved. He finds probiotics and fiber gummies are helping - Dig[employer].   He has cut back on his alcohol intake because he hasn't felt as well. Currently drinking 4 beers daily.   Labs 06/04/2022 show a normal comprehensive metabolic panel except for a low alkaline phosphatase at 27, normal lipase, and normal CBC except for a platelet count of 426  No prior endoscopic evaluation.  No prior abdominal imaging.  Father with diverticulitis. There is no known family history of colon cancer or polyps. No family history of stomach cancer or other GI malignancy. No family history of inflammatory bowel disease or celiac.    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 60 tablet 0   atorvastatin (LIPITOR) 40 MG tablet TAKE 1 TABLET BY MOUTH DAILY 90 tablet 1   DRYSOL 20 % external solution APPLY EXTERNALLY TO THE AFFECTED AREA DAILY 60 mL 0   fenofibrate 160 MG tablet TAKE 1 TABLET(160 MG) BY MOUTH DAILY 90 tablet  3   fluticasone (FLONASE) 50 MCG/ACT nasal spray SHAKE LIQUID AND USE 2 SPRAYS IN EACH NOSTRIL DAILY 16 g 3   levocetirizine (XYZAL) 5 MG tablet TAKE 1 TABLET(5 MG) BY MOUTH EVERY EVENING 30 tablet 11   MAGNESIUM PO Take 250 mg by mouth daily at 12 noon.     niacin (NIASPAN) 1000 MG CR tablet TAKE 1 TABLET(1000 MG) BY MOUTH AT  BEDTIME 90 tablet 3   Omega-3 Fatty Acids (FISH OIL PO) Take by mouth daily.     sertraline (ZOLOFT) 100 MG tablet TAKE 1 TABLET(100 MG) BY MOUTH DAILY 30 tablet 2   SUMAtriptan (IMITREX) 100 MG tablet Take 1 tablet (100 mg total) by mouth as needed for headache. May repeat in 2 hours if headache persists or recurs. 10 tablet 5   temazepam (RESTORIL) 30 MG capsule TAKE 1 CAPSULE BY MOUTH EVERY DAY AT BEDTIME AS NEEDED FOR SLEEP 30 capsule 5   No current facility-administered medications for this visit.    Allergies as of 07/06/2022 - Review Complete 07/06/2022  Allergen Reaction Noted   Doxycycline  03/14/2015    Family History  Problem Relation Age of Onset   Ovarian cancer Paternal Grandmother    Hyperlipidemia Other    Hypertension Other    Colon cancer Neg Hx    Rectal cancer Neg Hx    Stomach cancer Neg Hx    Esophageal cancer Neg Hx     Social History   Socioeconomic History   Marital status: Married    Spouse name: Not on file   Number of children: Not on file   Years of education: Not on file   Highest education level: Master's degree (e.g., MA, MS, MEng, MEd, MSW, MBA)  Occupational History   Not on file  Tobacco Use   Smoking status: Former    Types: Cigarettes    Quit date: 07/18/2017    Years since quitting: 4.9   Smokeless tobacco: Never   Tobacco comments:    5 CIGARETTES DAILY   Vaping Use   Vaping Use: Never used  Substance and Sexual Activity   Alcohol use: Yes    Alcohol/week: 4.0 standard drinks of alcohol    Types: 4 Cans of beer per week   Drug use: No   Sexual activity: Not on file  Other Topics Concern   Not on file  Social History Narrative   Not on file   Social Determinants of Health   Financial Resource Strain: Low Risk  (06/01/2022)   Overall Financial Resource Strain (CARDIA)    Difficulty of Paying Living Expenses: Not very hard  Food Insecurity: No Food Insecurity (06/01/2022)   Hunger Vital Sign    Worried About Running Out  of Food in the Last Year: Never true    Ran Out of Food in the Last Year: Never true  Transportation Needs: No Transportation Needs (06/01/2022)   PRAPARE - Hydrologist (Medical): No    Lack of Transportation (Non-Medical): No  Physical Activity: Insufficiently Active (06/01/2022)   Exercise Vital Sign    Days of Exercise per Week: 3 days    Minutes of Exercise per Session: 30 min  Stress: Stress Concern Present (06/01/2022)   [place]    Feeling of Stress : Rather much  Social Connections: Socially Integrated (06/01/2022)   Social Connection and Isolation Panel [NHANES]    Frequency of Communication with Friends and Family: More  than three times a week    Frequency of Social Gatherings with Friends and Family: Twice a week    Attends Religious Services: 1 to 4 times per year    Active Member of [employer] or Organizations: Yes    Attends Archivist Meetings: 1 to 4 times per year    Marital Status: Married  Human resources officer Violence: Not on file    Review of Systems: 12 system ROS is negative except as noted above with the addition of allergies, anxiety, cough, depression, fatigue, muscle pains, insomnia, and excessive thirst.   Physical Exam: General:   Alert,  well-nourished, pleasant and cooperative in NAD Head:  Normocephalic and atraumatic. Eyes:  Sclera clear, no icterus.   Conjunctiva pink. Ears:  Normal auditory acuity. Nose:  No deformity, discharge,  or lesions. Mouth:  No deformity or lesions.   Neck:  Supple; no masses or thyromegaly. Lungs:  Clear throughout to auscultation.   No wheezes. Heart:  Regular rate and rhythm; no murmurs. Abdomen:  Soft, nontender, nondistended, normal bowel sounds, no rebound or guarding. No hepatosplenomegaly.   Rectal:  Deferred  Msk:  Symmetrical. No boney deformities LAD: No inguinal or umbilical LAD Extremities:  No clubbing or  edema. Neurologic:  Alert and  oriented x4;  grossly nonfocal Skin:  Intact without significant lesions or rashes. Psych:  Alert and cooperative. Normal mood and affect.   Audrie Kuri L. Tarri Glenn, MD, MPH 07/06/2022, 3:31 PM

## 2022-07-06 NOTE — Telephone Encounter (Signed)
Patient called the pharmacy and they do not have the message for an early fill. Pharmacy closes for the weekend this evening

## 2022-07-06 NOTE — Patient Instructions (Signed)
Your provider has requested that you go to the basement level for lab work before leaving today. Press "B" on the elevator. The lab is located at the first door on the left as you exit the elevator.  You have been scheduled for a colonoscopy. Please follow written instructions given to you at your visit today.  Please pick up your prep supplies at the pharmacy within the next 1-3 days. If you use inhalers (even only as needed), please bring them with you on the day of your procedure.  COLONOSCOPY TIPS:  To reduce nausea and dehydration, stay well hydrated for 3-4 days prior to the exam.  To prevent skin/hemorrhoid irritation - prior to wiping, put A&Dointment or vaseline on the toilet paper. Keep a towel or pad on the bed.  BEFORE STARTING YOUR PREP, drink  64oz of clear liquids in the morning. This will help to flush the colon and will ensure you are well hydrated!!!!  NOTE - This is in addition to the fluids required for to complete your prep. Use of a flavored hard candy, such as grape Anise Salvo, can counteract some of the flavor of the prep and may prevent some nausea.  _______________________________________________________  If you are age 18 or older, your body mass index should be between 23-30. Your Body mass index is 23.45 kg/m. If this is out of the aforementioned range listed, please consider follow up with your Primary Care Provider.  If you are age 63 or younger, your body mass index should be between 19-25. Your Body mass index is 23.45 kg/m. If this is out of the aformentioned range listed, please consider follow up with your Primary Care Provider.   ________________________________________________________  The  [facility] providers would like to encourage you to use [facility] to communicate with providers for non-urgent requests or questions.  Due to long hold times on the telephone, sending your provider a message by [facility] may be a faster and more efficient way to get a  response.  Please allow 48 business hours for a response.  Please remember that this is for non-urgent requests.  _______________________________________________________

## 2022-07-06 NOTE — Telephone Encounter (Signed)
Please call his pharmacy to okay an early refill today

## 2022-07-10 LAB — CALPROTECTIN, FECAL: Calprotectin, Fecal: 9 ug/g (ref 0–120)

## 2022-07-12 ENCOUNTER — Encounter: Payer: Self-pay | Admitting: Gastroenterology

## 2022-07-13 LAB — PANCREATIC ELASTASE, FECAL: Pancreatic Elastase-1, Stool: 174 mcg/g — ABNORMAL LOW

## 2022-07-14 ENCOUNTER — Encounter: Payer: Self-pay | Admitting: Gastroenterology

## 2022-07-18 ENCOUNTER — Encounter: Payer: Self-pay | Admitting: Gastroenterology

## 2022-07-18 ENCOUNTER — Other Ambulatory Visit: Payer: Self-pay

## 2022-07-18 ENCOUNTER — Other Ambulatory Visit: Payer: Self-pay | Admitting: Gastroenterology

## 2022-07-18 ENCOUNTER — Telehealth: Payer: Self-pay

## 2022-07-18 ENCOUNTER — Ambulatory Visit (AMBULATORY_SURGERY_CENTER): Payer: BC Managed Care – PPO | Admitting: Gastroenterology

## 2022-07-18 VITALS — BP 151/102 | HR 58 | Temp 97.5°F | Resp 14 | Ht 68.0 in | Wt 154.0 lb

## 2022-07-18 DIAGNOSIS — D124 Benign neoplasm of descending colon: Secondary | ICD-10-CM

## 2022-07-18 DIAGNOSIS — R197 Diarrhea, unspecified: Secondary | ICD-10-CM | POA: Diagnosis not present

## 2022-07-18 DIAGNOSIS — K2210 Ulcer of esophagus without bleeding: Secondary | ICD-10-CM

## 2022-07-18 DIAGNOSIS — R109 Unspecified abdominal pain: Secondary | ICD-10-CM

## 2022-07-18 DIAGNOSIS — K209 Esophagitis, unspecified without bleeding: Secondary | ICD-10-CM | POA: Diagnosis not present

## 2022-07-18 DIAGNOSIS — D122 Benign neoplasm of ascending colon: Secondary | ICD-10-CM

## 2022-07-18 DIAGNOSIS — D123 Benign neoplasm of transverse colon: Secondary | ICD-10-CM | POA: Diagnosis not present

## 2022-07-18 DIAGNOSIS — D125 Benign neoplasm of sigmoid colon: Secondary | ICD-10-CM | POA: Diagnosis not present

## 2022-07-18 DIAGNOSIS — D128 Benign neoplasm of rectum: Secondary | ICD-10-CM | POA: Diagnosis not present

## 2022-07-18 DIAGNOSIS — K3189 Other diseases of stomach and duodenum: Secondary | ICD-10-CM

## 2022-07-18 DIAGNOSIS — K319 Disease of stomach and duodenum, unspecified: Secondary | ICD-10-CM | POA: Diagnosis not present

## 2022-07-18 DIAGNOSIS — D120 Benign neoplasm of cecum: Secondary | ICD-10-CM | POA: Diagnosis not present

## 2022-07-18 DIAGNOSIS — K635 Polyp of colon: Secondary | ICD-10-CM

## 2022-07-18 HISTORY — PX: COLONOSCOPY W/ BIOPSIES AND POLYPECTOMY: SHX1376

## 2022-07-18 HISTORY — PX: ESOPHAGOGASTRODUODENOSCOPY: SHX1529

## 2022-07-18 MED ORDER — SODIUM CHLORIDE 0.9 % IV SOLN: 500.0000 mL | Freq: Once | INTRAVENOUS | Status: DC

## 2022-07-18 MED ORDER — PANTOPRAZOLE SODIUM 40 MG PO TBEC: DELAYED_RELEASE_TABLET | ORAL | 1 refills | Status: DC

## 2022-07-18 NOTE — Telephone Encounter (Signed)
-----   Message from Thornton Park, MD sent at 07/18/2022  4:40 PM EDT ----- Referral for genetic counseling - 15 polyps removed on colonoscopy.  Thanks.  KLB

## 2022-07-18 NOTE — Op Note (Signed)
[place] Patient Name: Jordan Robles Procedure Date: 07/18/2022 3:43 PM MRN: [id number] Endoscopist: Thornton Park MD, MD, [id number] Age: 41 Referring MD:  Date of Birth: Aug 21, 1981 Gender: Male Account #: [id number] Procedure:                Colonoscopy Indications:              Abdominal pain, Chronic diarrhea, Weight loss Medicines:                Monitored Anesthesia Care Procedure:                Pre-Anesthesia Assessment:                           - Prior to the procedure, a History and Physical                            was performed, and patient medications and                            allergies were reviewed. The patient's tolerance of                            previous anesthesia was also reviewed. The risks                            and benefits of the procedure and the sedation                            options and risks were discussed with the patient.                            All questions were answered, and informed consent                            was obtained. Prior Anticoagulants: The patient has                            taken no anticoagulant or antiplatelet agents. ASA                            Grade Assessment: II - A patient with mild systemic                            disease. After reviewing the risks and benefits,                            the patient was deemed in satisfactory condition to                            undergo the procedure.                           After obtaining informed consent, the colonoscope  was passed under direct vision. Throughout the                            procedure, the patient's blood pressure, pulse, and                            oxygen saturations were monitored continuously. The                            Olympus CF-HQ190L (Serial# [id number]) Colonoscope was                            introduced through the anus and advanced to the 3                            cm into  the ileum. The colonoscopy was performed                            without difficulty. The patient tolerated the                            procedure well. The quality of the bowel                            preparation was good. The terminal ileum, ileocecal                            valve, appendiceal orifice, and rectum were                            photographed. Scope In: 4:01:07 PM Scope Out: 4:30:36 PM Scope Withdrawal Time: 0 hours 25 minutes 16 seconds  Total Procedure Duration: 0 hours 29 minutes 29 seconds  Findings:                 The perianal and digital rectal examinations were                            normal.                           Many small-mouthed diverticula were found in the                            sigmoid colon, descending colon and ascending colon.                           Three sessile polyps were found in the rectum. The                            polyps were 2 to 4 mm in size. These polyps were                            removed with a cold snare. Resection and retrieval  were complete. Estimated blood loss was minimal.                           Seven sessile polyps were found in the sigmoid                            colon, descending colon and transverse colon. The                            polyps were 2 to 4 mm in size. These polyps were                            removed with a cold snare. Resection and retrieval                            were complete. Estimated blood loss was minimal.                           Five sessile polyps were found in the ascending                            colon and cecum. The polyps were 7 to 8 mm in size.                            These polyps were removed with a cold snare.                            Resection and retrieval were complete. Estimated                            blood loss was minimal.                           The remainder of the examined colonic mucosa                             appeared normal. Biopsies for histology were taken                            with a cold forceps from the right colon and left                            colon. Estimated blood loss was minimal. Complications:            No immediate complications. Estimated Blood Loss:     Estimated blood loss was minimal. Impression:               - Diverticulosis in the sigmoid colon, in the                            descending colon and in the ascending colon.                           -  Three 2 to 4 mm polyps in the rectum, removed                            with a cold snare. Resected and retrieved.                           - Seven 2 to 4 mm polyps in the sigmoid colon, in                            the descending colon and in the transverse colon,                            removed with a cold snare. Resected and retrieved.                           - Five 7 to 8 mm polyps in the ascending colon and                            in the cecum, removed with a cold snare. Resected                            and retrieved.                           - The entire examined colon is normal. Biopsied. Recommendation:           - Patient has a contact number available for                            emergencies. The signs and symptoms of potential                            delayed complications were discussed with the                            patient. Return to normal activities tomorrow.                            Written discharge instructions were provided to the                            patient.                           - High fiber diet.                           - Continue present medications.                           - Await pathology results.                           - Repeat colonoscopy date to be determined after  pending pathology results are reviewed for                            surveillance.                           - Follow a high fiber diet. Drink  at least 64                            ounces of water daily. Add a daily stool bulking                            agent such as psyllium (an exampled would be                            Metamucil).                           - Emerging evidence supports eating a diet of                            fruits, vegetables, grains, calcium, and yogurt                            while reducing red meat and alcohol may reduce the                            risk of colon cancer.                           - Given the number of polyps removed today, I                            recommend that you consult with a genetic counselor.                           - Given these results, all first degree relatives                            (brothers, sisters, children, parents) should start                            colon cancer screening at age 2. Thornton Park MD, MD 07/18/2022 4:42:45 PM This report has been signed electronically.

## 2022-07-18 NOTE — Progress Notes (Unsigned)
Called to room to assist during endoscopic procedure.  Patient ID and intended procedure confirmed with present staff. Received instructions for my participation in the procedure from the performing physician.  

## 2022-07-18 NOTE — Progress Notes (Signed)
Pt resting comfortably. VSS. Airway intact. SBAR complete to RN. All questions answered.   

## 2022-07-18 NOTE — Patient Instructions (Signed)
Discharge instructions given. Handouts on polyps,Hiatal Hernia and esophagitis. Resume previous medications. Prescription sent to pharmacy. YOU HAD AN ENDOSCOPIC PROCEDURE TODAY AT [facility]:   Refer to the procedure report that was given to you for any specific questions about what was found during the examination.  If the procedure report does not answer your questions, please call your gastroenterologist to clarify.  If you requested that your care partner not be given the details of your procedure findings, then the procedure report has been included in a sealed envelope for you to review at your convenience later.  YOU SHOULD EXPECT: Some feelings of bloating in the abdomen. Passage of more gas than usual.  Walking can help get rid of the air that was put into your GI tract during the procedure and reduce the bloating. If you had a lower endoscopy (such as a colonoscopy or flexible sigmoidoscopy) you may notice spotting of blood in your stool or on the toilet paper. If you underwent a bowel prep for your procedure, you may not have a normal bowel movement for a few days.  Please Note:  You might notice some irritation and congestion in your nose or some drainage.  This is from the oxygen used during your procedure.  There is no need for concern and it should clear up in a day or so.  SYMPTOMS TO REPORT IMMEDIATELY:  Following lower endoscopy (colonoscopy or flexible sigmoidoscopy):  Excessive amounts of blood in the stool  Significant tenderness or worsening of abdominal pains  Swelling of the abdomen that is new, acute  Fever of 100F or higher  Following upper endoscopy (EGD)  Vomiting of blood or coffee ground material  New chest pain or pain under the shoulder blades  Painful or persistently difficult swallowing  New shortness of breath  Fever of 100F or higher  Black, tarry-looking stools  For urgent or emergent issues, a gastroenterologist can be reached at  any hour by calling [phone]. Do not use MyChart messaging for urgent concerns.    DIET:  We do recommend a small meal at first, but then you may proceed to your regular diet.  Drink plenty of fluids but you should avoid alcoholic beverages for 24 hours.  ACTIVITY:  You should plan to take it easy for the rest of today and you should NOT DRIVE or use heavy machinery until tomorrow (because of the sedation medicines used during the test).    FOLLOW UP: Our staff will call the number listed on your records the next business day following your procedure.  We will call around 7:15- 8:00 am to check on you and address any questions or concerns that you may have regarding the information given to you following your procedure. If we do not reach you, we will leave a message.     If any biopsies were taken you will be contacted by phone or by letter within the next 1-3 weeks.  Please call us at [phone] if you have not heard about the biopsies in 3 weeks.    SIGNATURES/CONFIDENTIALITY: You and/or your care partner have signed paperwork which will be entered into your electronic medical record.  These signatures attest to the fact that that the information above on your After Visit Summary has been reviewed and is understood.  Full responsibility of the confidentiality of this discharge information lies with you and/or your care-partner.

## 2022-07-18 NOTE — Telephone Encounter (Signed)
Referral placed.

## 2022-07-18 NOTE — Op Note (Signed)
[facility] Patient Name: Jordan Robles Procedure Date: 07/18/2022 3:43 PM MRN: [id number] Endoscopist: Thornton Park MD, MD, [id number] Age: 41 Referring MD:  Date of Birth: 1981-03-12 Gender: Male Account #: [id number] Procedure:                Upper GI endoscopy Indications:              Abdominal pain, Diarrhea, Weight loss Medicines:                Monitored Anesthesia Care Procedure:                Pre-Anesthesia Assessment:                           - Prior to the procedure, a History and Physical                            was performed, and patient medications and                            allergies were reviewed. The patient's tolerance of                            previous anesthesia was also reviewed. The risks                            and benefits of the procedure and the sedation                            options and risks were discussed with the patient.                            All questions were answered, and informed consent                            was obtained. Prior Anticoagulants: The patient has                            taken no anticoagulant or antiplatelet agents. ASA                            Grade Assessment: II - A patient with mild systemic                            disease. After reviewing the risks and benefits,                            the patient was deemed in satisfactory condition to                            undergo the procedure.                           After obtaining informed consent, the endoscope was  passed under direct vision. Throughout the                            procedure, the patient's blood pressure, pulse, and                            oxygen saturations were monitored continuously. The                            GIF [id number] #[id number] was introduced through the                            mouth, and advanced to the third part of duodenum.                            The upper GI  endoscopy was accomplished without                            difficulty. The patient tolerated the procedure                            well. Scope In: Scope Out: Findings:                 LA Grade D (one or more mucosal breaks involving at                            least 75% of esophageal circumference) esophagitis                            with no bleeding was found 38 cm from the incisors.                            Biopsies were taken with a cold forceps for                            histology. Estimated blood loss was minimal.                           Patchy mildly erythematous mucosa without bleeding                            was found in the gastric body. Biopsies were taken                            from the antrum, body, and fundus with a cold                            forceps for histology. Estimated blood loss was                            minimal.  A hiatal hernia was present.                           The examined duodenum was normal. Biopsies were                            taken with a cold forceps for histology. Estimated                            blood loss was minimal.                           The exam was otherwise without abnormality. Complications:            No immediate complications. Estimated Blood Loss:     Estimated blood loss was minimal. Impression:               - LA Grade D reflux esophagitis with no bleeding.                            Biopsied.                           - Erythematous mucosa in the gastric body. Biopsied.                           - Hiatal hernia.                           - Normal examined duodenum. Biopsied.                           - The examination was otherwise normal. Recommendation:           - Patient has a contact number available for                            emergencies. The signs and symptoms of potential                            delayed complications were discussed with the                             patient. Return to normal activities tomorrow.                            Written discharge instructions were provided to the                            patient.                           - Resume previous diet.                           - Continue present medications.                           -  Start pantoprazole 40 mg BID for at least 8 weeks.                           - Reflux lifestyle modifications.                           - Await pathology results.                           - No aspirin, ibuprofen, naproxen, or other                            non-steroidal anti-inflammatory drugs. Thornton Park MD, MD 07/18/2022 3:59:53 PM This report has been signed electronically.

## 2022-07-18 NOTE — Progress Notes (Signed)
Indication for EGD: Abdominal pain, diarrhea, 17 pound weight loss Indication from Colonoscopy: Abdominal pain, diarrhea, 17 pound weight loss  See the 07/06/22 office visit for complete details. There has been no change in history of physical exam. He remains an appropriate candidate for monitored anesthesia care in the [facility].

## 2022-07-19 ENCOUNTER — Telehealth: Payer: Self-pay | Admitting: *Deleted

## 2022-07-19 NOTE — Telephone Encounter (Signed)
Post procedure follow up call placed, no answer and left VM.  

## 2022-07-23 ENCOUNTER — Telehealth: Payer: Self-pay | Admitting: Genetic Counselor

## 2022-07-23 ENCOUNTER — Encounter: Payer: Self-pay | Admitting: Gastroenterology

## 2022-07-23 NOTE — Telephone Encounter (Signed)
Scheduled appointment per 11/06 referral . Patient is aware of appointment date and time. Patient is aware to arrive 15 mins prior to appointment time and to bring updated insurance cards. Patient is aware of location.

## 2022-07-26 ENCOUNTER — Other Ambulatory Visit: Payer: Self-pay | Admitting: Family Medicine

## 2022-07-27 ENCOUNTER — Encounter: Payer: Self-pay | Admitting: Family Medicine

## 2022-07-27 ENCOUNTER — Other Ambulatory Visit: Payer: Self-pay

## 2022-07-27 MED ORDER — METOPROLOL SUCCINATE ER 50 MG PO TB24: 50.0000 mg | ORAL_TABLET | Freq: Every day | ORAL | 2 refills | Status: DC

## 2022-07-27 NOTE — Telephone Encounter (Signed)
We can go ahead and start treating this. Call in Metoprolol succinate 50 mg daily, #30 with 2 rf. Have him see me in the office in 4 weeks.

## 2022-08-20 ENCOUNTER — Other Ambulatory Visit: Payer: Self-pay | Admitting: Family Medicine

## 2022-08-20 DIAGNOSIS — J310 Chronic rhinitis: Secondary | ICD-10-CM

## 2022-08-27 ENCOUNTER — Other Ambulatory Visit: Payer: Self-pay | Admitting: Family Medicine

## 2022-08-28 MED ORDER — AMPHETAMINE-DEXTROAMPHETAMINE 10 MG PO TABS: 10.0000 mg | ORAL_TABLET | Freq: Two times a day (BID) | ORAL | 0 refills | Status: DC

## 2022-08-28 NOTE — Telephone Encounter (Signed)
Done

## 2022-08-28 NOTE — Telephone Encounter (Signed)
Last refill-07/06/22-60 tabs, 0 refills Last OV-06-04-22  Next OV-10/18/2022

## 2022-09-02 ENCOUNTER — Other Ambulatory Visit: Payer: Self-pay | Admitting: Family Medicine

## 2022-09-02 ENCOUNTER — Other Ambulatory Visit: Payer: Self-pay | Admitting: Gastroenterology

## 2022-09-02 DIAGNOSIS — R109 Unspecified abdominal pain: Secondary | ICD-10-CM

## 2022-09-03 NOTE — Telephone Encounter (Signed)
Pt LOV was on 06/04/22 Last refill was done on 02/20/22 Please advise

## 2022-09-05 ENCOUNTER — Other Ambulatory Visit: Payer: Self-pay | Admitting: Gastroenterology

## 2022-09-05 DIAGNOSIS — R109 Unspecified abdominal pain: Secondary | ICD-10-CM

## 2022-10-02 ENCOUNTER — Telehealth: Payer: Self-pay

## 2022-10-02 NOTE — Telephone Encounter (Signed)
-----  Message from Thornton Park, MD sent at 10/02/2022 11:43 AM EST ----- Please arrange office follow-up with me or an APP - next available.  Thanks.  KLB

## 2022-10-02 NOTE — Telephone Encounter (Signed)
Jordan Park, MD  Carl Best, RN He came with his wife to her procedure and he is feeling much better! He declined office follow-up.  Thanks.  KLB

## 2022-10-09 ENCOUNTER — Inpatient Hospital Stay: Payer: BC Managed Care – PPO | Attending: Genetic Counselor | Admitting: Genetic Counselor

## 2022-10-09 ENCOUNTER — Other Ambulatory Visit: Payer: Self-pay | Admitting: Genetic Counselor

## 2022-10-09 ENCOUNTER — Encounter: Payer: Self-pay | Admitting: Genetic Counselor

## 2022-10-09 DIAGNOSIS — Z8041 Family history of malignant neoplasm of ovary: Secondary | ICD-10-CM | POA: Diagnosis not present

## 2022-10-09 DIAGNOSIS — Z860100 Personal history of colon polyps, unspecified: Secondary | ICD-10-CM | POA: Insufficient documentation

## 2022-10-09 DIAGNOSIS — Z1379 Encounter for other screening for genetic and chromosomal anomalies: Secondary | ICD-10-CM

## 2022-10-09 NOTE — Progress Notes (Signed)
REFERRING PROVIDER: Thornton Park, MD [place],  [place] [address]  PRIMARY PROVIDER:  Laurey Morale, MD  PRIMARY REASON FOR VISIT:  Encounter Diagnoses  Name Primary?   Personal history of colonic polyps Yes   Family history of ovarian cancer     I connected with Jordan Robles on 10/09/2022 at 9:00AM EDT by Webex video conference and verified that I am speaking with the correct person using two identifiers.   Patient location: [place], [place] Provider location: [place], [place]   HISTORY OF PRESENT ILLNESS:   Jordan Robles, a 42 y.o. male, was seen for a [facility] [place] cancer genetics consultation at the request of Dr. Tarri Glenn due to a personal history of colon polyps.  Jordan Robles presents to clinic today to discuss the possibility of a hereditary predisposition to cancer, to discuss genetic testing, and to further clarify his future cancer risks, as well as potential cancer risks for family members.   Jordan Robles had a colonoscopy in November 2023 that identified 15 total colon polyps (13 tubular adenomas, 2 hyperplastic polyps). Jordan Robles has a personal history of testicular cancer diagnosed at age 11.  CANCER HISTORY:  Oncology History   No history exists.    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    Past Surgical History:  Procedure Laterality Date   left orchiectomy  09/18/1995   VASECTOMY      Social History   Socioeconomic History   Marital status: Married    Spouse name: Not on file   Number of children: Not on file   Years of education: Not on file   Highest education level: Master's degree (e.g., MA, MS, MEng, MEd, MSW, MBA)  Occupational History   Not on file  Tobacco Use   Smoking status: Former    Types: Cigarettes    Quit date: 07/18/2017    Years since quitting: 5.2   Smokeless  tobacco: Never   Tobacco comments:    5 CIGARETTES DAILY   Vaping Use   Vaping Use: Never used  Substance and Sexual Activity   Alcohol use: Yes    Alcohol/week: 4.0 standard drinks of alcohol    Types: 4 Cans of beer per week   Drug use: No   Sexual activity: Not on file  Other Topics Concern   Not on file  Social History Narrative   Not on file   Social Determinants of Health   Financial Resource Strain: Low Risk  (06/01/2022)   Overall Financial Resource Strain (CARDIA)    Difficulty of Paying Living Expenses: Not very hard  Food Insecurity: No Food Insecurity (06/01/2022)   Hunger Vital Sign    Worried About Running Out of Food in the Last Year: Never true    Ran Out of Food in the Last Year: Never true  Transportation Needs: No Transportation Needs (06/01/2022)   PRAPARE - Hydrologist (Medical): No    Lack of Transportation (Non-Medical): No  Physical Activity: Insufficiently Active (06/01/2022)   Exercise Vital Sign    Days of Exercise per Week: 3 days    Minutes of Exercise per Session: 30 min  Stress: Stress Concern Present (06/01/2022)   [place]    Feeling of Stress : Rather much  Social Connections: Socially Integrated (06/01/2022)   Social Connection and Isolation Panel [NHANES]    Frequency of Communication with Friends and Family: More than three times a week    Frequency of Social Gatherings with Friends and Family: Twice a week    Attends Religious Services: 1 to 4 times per year    Active Member of [employer] or Organizations: Yes    Attends Archivist Meetings: 1 to 4 times per year    Marital Status: Married     FAMILY HISTORY:  We obtained a detailed, 4-generation family history.  Significant diagnoses are listed below: Family History  Problem Relation Age of Onset   Cancer Sister 8       rare tumor on her chest   Lung cancer Maternal Grandfather         smoked   Ovarian cancer Paternal Grandmother    Hyperlipidemia Other    Hypertension Other    Breast cancer Cousin        paternal first cousin   Leukemia Half-Brother 7       paternal half-brother   Colon cancer Neg Hx    Rectal cancer Neg Hx    Stomach cancer Neg Hx    Esophageal cancer Neg Hx        Jordan Robles's sister was diagnosed with a rare tumor on her chest at age 35. His maternal grandfather was diagnosed with lung cancer, he smoked and is deceased. Jordan Robles paternal half-brother was diagnosed with leukemia at age 45. His paternal cousin was diagnosed with breast cancer. His paternal grandmother died due to metastatic ovarian cancer. Jordan Robles is unaware of previous family history of genetic testing for hereditary cancer risks.   GENETIC COUNSELING ASSESSMENT: Jordan Robles is a 42 y.o. male with a personal history of colon polyps which is somewhat suggestive of a hereditary predisposition to cancer given >10 tubular adenomas. We, therefore, discussed and recommended the following at today's visit.   DISCUSSION: We discussed that 5 - 10% of cancer is hereditary, with most cases of polyposis associated with APC and MUTYH.  There are other genes that can be associated with hereditary polyposis and cancer syndromes. We discussed that testing is beneficial for several reasons, including knowing about other cancer risks, identifying potential screening and risk-reduction options that may be appropriate, and to understanding if other family members could be at risk for cancer and allowing them to undergo genetic testing.  We reviewed the characteristics, features and inheritance patterns of hereditary cancer syndromes. We also discussed genetic testing, including the appropriate family members to test, the process of testing, insurance coverage and turn-around-time for results. We discussed the implications of a negative, positive, carrier and/or variant of uncertain significant result.  We recommended Jordan Robles pursue genetic testing for a panel that includes genes associated with polyposis, breast cancer, and ovarian cancer.   Jordan Robles  was offered a common hereditary cancer panel (48 genes) and an expanded pan-cancer panel (70 genes). Jordan Robles was informed of the benefits and limitations of each panel, including that expanded pan-cancer panels contain genes that do not have clear management guidelines at this point in time.  We also discussed that as the number of genes included on a panel increases, the chances of variants of uncertain significance increases. After considering the benefits and limitations of each gene panel, Jordan Robles elected to have [facility] Multi-Cancer Panel.  The Multi-Cancer + RNA Panel offered by [facility] includes sequencing and/or deletion/duplication analysis of  the following 70 genes:  AIP*, ALK, APC*, ATM*, AXIN2*, BAP1*, BARD1*, BLM*, BMPR1A*, BRCA1*, BRCA2*, BRIP1*, CDC73*, CDH1*, CDK4, CDKN1B*, CDKN2A, CHEK2*, CTNNA1*, DICER1*, EPCAM (del/dup only), EGFR, FH*, FLCN*, GREM1 (promoter dup only), HOXB13, KIT, LZTR1, MAX*, MBD4, MEN1*, MET, MITF, MLH1*, MSH2*, MSH3*, MSH6*, MUTYH*, NF1*, NF2*, NTHL1*, PALB2*, PDGFRA, PMS2*, POLD1*, POLE*, POT1*, PRKAR1A*, PTCH1*, PTEN*, RAD51C*, RAD51D*, RB1*, RET, SDHA* (sequencing only), SDHAF2*, SDHB*, SDHC*, SDHD*, SMAD4*, SMARCA4*, SMARCB1*, SMARCE1*, STK11*, SUFU*, TMEM127*, TP53*, TSC1*, TSC2*, VHL*. RNA analysis is performed for * genes.  Based on Mr. Sato personal and family history of cancer, he meets medical criteria for genetic testing. Despite that he meets criteria, he may still have an out of pocket cost. We discussed that if his out of pocket cost for testing is over $100, the laboratory will call and confirm whether he wants to proceed with testing.  If the out of pocket cost of testing is less than $100 he will be billed by the genetic testing laboratory.   PLAN: After considering the risks, benefits,  and limitations, Mr. Corona provided informed consent to pursue genetic testing and he has a lab appointment on 10/18/2022 for [facility] Multi-Cancer Panel. Results should be available within approximately 2-3 weeks' time, at which point they will be disclosed by telephone to Mr. Ciavarella, as will any additional recommendations warranted by these results. Mr. Souder will receive a summary of his genetic counseling visit and a copy of his results once available. This information will also be available in Epic.   Mr. Rickel questions were answered to his satisfaction today. Our contact information was provided should additional questions or concerns arise. Thank you for the referral and allowing [place] to share in the care of your patient.   Lucille Passy, MS, [facility] Genetic Counselor [email or web address] (P) [phone]  The patient was seen for a total of 30 minutes in video genetic counseling. The patient was seen alone.  Drs. Lindi Adie and/or Burr Medico were available to discuss this case as needed.   _______________________________________________________________________ For Office Staff:  Number of people involved in session: 1 Was an Intern/ student involved with case: no

## 2022-10-12 ENCOUNTER — Telehealth: Payer: Self-pay

## 2022-10-18 ENCOUNTER — Ambulatory Visit (INDEPENDENT_AMBULATORY_CARE_PROVIDER_SITE_OTHER): Payer: BC Managed Care – PPO | Admitting: Family Medicine

## 2022-10-18 ENCOUNTER — Encounter: Payer: Self-pay | Admitting: Family Medicine

## 2022-10-18 ENCOUNTER — Inpatient Hospital Stay: Payer: BC Managed Care – PPO

## 2022-10-18 ENCOUNTER — Encounter: Payer: BC Managed Care – PPO | Admitting: Family Medicine

## 2022-10-18 VITALS — BP 130/86 | HR 61 | Temp 97.9°F | Ht 68.0 in | Wt 175.0 lb

## 2022-10-18 DIAGNOSIS — Z1379 Encounter for other screening for genetic and chromosomal anomalies: Secondary | ICD-10-CM

## 2022-10-18 DIAGNOSIS — J310 Chronic rhinitis: Secondary | ICD-10-CM | POA: Diagnosis not present

## 2022-10-18 DIAGNOSIS — Z0000 Encounter for general adult medical examination without abnormal findings: Secondary | ICD-10-CM | POA: Diagnosis not present

## 2022-10-18 LAB — HEPATIC FUNCTION PANEL
ALT: 27 U/L (ref 0–53)
AST: 28 U/L (ref 0–37)
Albumin: 5 g/dL (ref 3.5–5.2)
Alkaline Phosphatase: 26 U/L — ABNORMAL LOW (ref 39–117)
Bilirubin, Direct: 0.2 mg/dL (ref 0.0–0.3)
Total Bilirubin: 1 mg/dL (ref 0.2–1.2)
Total Protein: 7.1 g/dL (ref 6.0–8.3)

## 2022-10-18 LAB — BASIC METABOLIC PANEL
BUN: 16 mg/dL (ref 6–23)
CO2: 29 mEq/L (ref 19–32)
Calcium: 9.8 mg/dL (ref 8.4–10.5)
Chloride: 100 mEq/L (ref 96–112)
Creatinine, Ser: 1.01 mg/dL (ref 0.40–1.50)
GFR: 90.77 mL/min (ref >60.00)
Glucose, Bld: 81 mg/dL (ref 70–99)
Potassium: 4.5 mEq/L (ref 3.5–5.1)
Sodium: 140 mEq/L (ref 135–145)

## 2022-10-18 LAB — LIPID PANEL
Cholesterol: 178 mg/dL (ref 0–200)
HDL: 59.8 mg/dL (ref >39.00)
NonHDL: 118.51
Total CHOL/HDL Ratio: 3
Triglycerides: 295 mg/dL — ABNORMAL HIGH (ref 0.0–149.0)
VLDL: 59 mg/dL — ABNORMAL HIGH (ref 0.0–40.0)

## 2022-10-18 LAB — CBC WITH DIFFERENTIAL/PLATELET
Basophils Absolute: 0.1 10*3/uL (ref 0.0–0.1)
Basophils Relative: 1.8 % (ref 0.0–3.0)
Eosinophils Absolute: 0.1 10*3/uL (ref 0.0–0.7)
Eosinophils Relative: 1.9 % (ref 0.0–5.0)
HCT: 46.1 % (ref 39.0–52.0)
Hemoglobin: 15.8 g/dL (ref 13.0–17.0)
Lymphocytes Relative: 32.4 % (ref 12.0–46.0)
Lymphs Abs: 2.3 10*3/uL (ref 0.7–4.0)
MCHC: 34.2 g/dL (ref 30.0–36.0)
MCV: 94.8 fl (ref 78.0–100.0)
Monocytes Absolute: 0.6 10*3/uL (ref 0.1–1.0)
Monocytes Relative: 8.3 % (ref 3.0–12.0)
Neutro Abs: 4 10*3/uL (ref 1.4–7.7)
Neutrophils Relative %: 55.6 % (ref 43.0–77.0)
Platelets: 329 10*3/uL (ref 150.0–400.0)
RBC: 4.87 Mil/uL (ref 4.22–5.81)
RDW: 14.2 % (ref 11.5–15.5)
WBC: 7.2 10*3/uL (ref 4.0–10.5)

## 2022-10-18 LAB — LDL CHOLESTEROL, DIRECT: Direct LDL: 79 mg/dL

## 2022-10-18 LAB — GENETIC SCREENING ORDER

## 2022-10-18 LAB — PSA: PSA: 0.35 ng/mL (ref 0.10–4.00)

## 2022-10-18 LAB — TSH: TSH: 3.23 u[IU]/mL (ref 0.35–5.50)

## 2022-10-18 LAB — HEMOGLOBIN A1C: Hgb A1c MFr Bld: 5.3 % (ref 4.6–6.5)

## 2022-10-18 MED ORDER — FLUTICASONE PROPIONATE 50 MCG/ACT NA SUSP: NASAL | 11 refills | Status: DC

## 2022-10-18 MED ORDER — METOPROLOL SUCCINATE ER 50 MG PO TB24: 50.0000 mg | ORAL_TABLET | Freq: Every day | ORAL | 3 refills | Status: DC

## 2022-10-18 MED ORDER — SERTRALINE HCL 100 MG PO TABS: ORAL_TABLET | ORAL | 3 refills | Status: DC

## 2022-10-18 NOTE — Progress Notes (Signed)
Subjective:    Patient ID: Jordan Robles, male    DOB: 12-Oct-1980, 42 y.o.   MRN: [id number]  HPI Here for a well exam. He feels well and has no complaints. We saw him in November with abdominal pain, diarrhea, and weight loss, and we referred him to GI. He saw Dr. Tarri Glenn, and she performed upper and lower endoscopies. The EGD revealed reflux esophagitis with negative H pylori. The colonoscopy revealed 13 adenomatous polyps and 2 hyperplastic polyps. Biopsies were negative for any neoplastic processes or IBD. He was started on Pantoprazole 40 mg BID, and he immediately began to feel better. He now denies any abdominal pain. His stools are regular. He has gained 21 lbs since early November. He has also gotten involved with genetic counseling, given an extensive family history of various cancers. He will have blood drawn for this later today. His depression and anxiety are well controlled, and he is very pleased with how the Sertraline is helping him.    Review of Systems  Constitutional: Negative.   HENT: Negative.    Eyes: Negative.   Respiratory: Negative.    Cardiovascular: Negative.   Gastrointestinal: Negative.   Genitourinary: Negative.   Musculoskeletal: Negative.   Skin: Negative.   Neurological: Negative.   Psychiatric/Behavioral: Negative.         Objective:   Physical Exam Constitutional:      General: He is not in acute distress.    Appearance: Normal appearance. He is well-developed. He is not diaphoretic.  HENT:     Head: Normocephalic and atraumatic.     Right Ear: External ear normal.     Left Ear: External ear normal.     Nose: Nose normal.     Mouth/Throat:     Pharynx: No oropharyngeal exudate.  Eyes:     General: No scleral icterus.       Right eye: No discharge.        Left eye: No discharge.     Conjunctiva/sclera: Conjunctivae normal.     Pupils: Pupils are equal, round, and reactive to light.  Neck:     Thyroid: No thyromegaly.     Vascular: No  JVD.     Trachea: No tracheal deviation.  Cardiovascular:     Rate and Rhythm: Normal rate and regular rhythm.     Pulses: Normal pulses.     Heart sounds: Normal heart sounds. No murmur heard.    No friction rub. No gallop.  Pulmonary:     Effort: Pulmonary effort is normal. No respiratory distress.     Breath sounds: Normal breath sounds. No wheezing or rales.  Chest:     Chest wall: No tenderness.  Abdominal:     General: Bowel sounds are normal. There is no distension.     Palpations: Abdomen is soft. There is no mass.     Tenderness: There is no abdominal tenderness. There is no guarding or rebound.  Genitourinary:    Penis: Normal. No tenderness.      Comments: Right testicle is normal, left is surgically absent  Musculoskeletal:        General: No tenderness. Normal range of motion.     Cervical back: Neck supple.  Lymphadenopathy:     Cervical: No cervical adenopathy.  Skin:    General: Skin is warm and dry.     Coloration: Skin is not pale.     Findings: No erythema or rash.  Neurological:     Mental Status: He  is alert and oriented to person, place, and time.     Cranial Nerves: No cranial nerve deficit.     Motor: No abnormal muscle tone.     Coordination: Coordination normal.     Deep Tendon Reflexes: Reflexes are normal and symmetric. Reflexes normal.  Psychiatric:        Mood and Affect: Mood normal.        Behavior: Behavior normal.        Thought Content: Thought content normal.        Judgment: Judgment normal.           Assessment & Plan:  Well exam. We discussed diet and exercise. Get fasting labs. He will stay on the current medications. He plans to have a follow up colonoscopy in one year. No further upper endoscopies are required.  Alysia Penna, MD

## 2022-10-28 ENCOUNTER — Encounter: Payer: Self-pay | Admitting: Gastroenterology

## 2022-10-29 ENCOUNTER — Telehealth: Payer: Self-pay | Admitting: Genetic Counselor

## 2022-10-29 ENCOUNTER — Encounter: Payer: Self-pay | Admitting: Genetic Counselor

## 2022-10-29 DIAGNOSIS — Z1379 Encounter for other screening for genetic and chromosomal anomalies: Secondary | ICD-10-CM | POA: Insufficient documentation

## 2022-10-29 NOTE — Telephone Encounter (Signed)
I contacted Mr. Romberger to discuss his genetic testing results. 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 No pathogenic variants were identified in the remaining 69 genes analyzed. 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 Detailed clinic note to follow.  The test report has been scanned into EPIC and is located under the Molecular Pathology section of the Results Review tab.  A portion of the result report is included below for reference.   Lucille Passy, MS, [facility] Genetic Counselor [email or web address] (P) [phone]

## 2022-10-30 ENCOUNTER — Other Ambulatory Visit: Payer: Self-pay | Admitting: Family Medicine

## 2022-10-30 ENCOUNTER — Encounter: Payer: Self-pay | Admitting: Family Medicine

## 2022-10-30 NOTE — Telephone Encounter (Signed)
Noted  

## 2022-11-01 ENCOUNTER — Encounter: Payer: Self-pay | Admitting: Family Medicine

## 2022-11-01 NOTE — Telephone Encounter (Signed)
Pt LOV was on 10/18/22 Last refill was done on 05/01/22 Please advise

## 2022-11-02 MED ORDER — ALPRAZOLAM 0.5 MG PO TABS: ORAL_TABLET | ORAL | 5 refills | Status: DC

## 2022-11-02 NOTE — Telephone Encounter (Signed)
I sent in the refills  

## 2022-11-05 ENCOUNTER — Ambulatory Visit: Payer: Self-pay | Admitting: Genetic Counselor

## 2022-11-05 ENCOUNTER — Encounter: Payer: Self-pay | Admitting: Genetic Counselor

## 2022-11-05 ENCOUNTER — Encounter: Payer: Self-pay | Admitting: Gastroenterology

## 2022-11-05 DIAGNOSIS — Z1379 Encounter for other screening for genetic and chromosomal anomalies: Secondary | ICD-10-CM

## 2022-11-05 NOTE — Progress Notes (Signed)
HPI:   Jordan Robles was previously seen in the [place] clinic due to a personal history of colon polyps and concerns regarding a hereditary predisposition to polyposis/cancer. Please refer to our prior cancer genetics clinic note for more information regarding our discussion, assessment and recommendations, at the time. Jordan Robles recent genetic test results were disclosed to him, as were recommendations warranted by these results. These results and recommendations are discussed in more detail below.  FAMILY HISTORY:  We obtained a detailed, 4-generation family history.  Significant diagnoses are listed below:      Family History  Problem Relation Age of Onset   Cancer Sister 8        rare tumor on her chest   Lung cancer Maternal Grandfather          smoked   Ovarian cancer Paternal Grandmother     Hyperlipidemia Other     Hypertension Other     Breast cancer Cousin          paternal first cousin   Leukemia Half-Brother 7        paternal half-brother   Colon cancer Neg Hx     Rectal cancer Neg Hx     Stomach cancer Neg Hx     Esophageal cancer Neg Hx             Jordan Robles's sister was diagnosed with a rare tumor on her chest at age 77. His maternal grandfather was diagnosed with lung cancer, he smoked and is deceased. Jordan Robles paternal half-brother was diagnosed with leukemia at age 74. His paternal cousin was diagnosed with breast cancer. His paternal grandmother died due to metastatic ovarian cancer. Jordan Robles is unaware of previous family history of genetic testing for hereditary cancer risks.   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 The remaining 69 genes were negative.   The Multi-Cancer + RNA Panel offered by [facility] includes sequencing and/or deletion/duplication analysis of the following 70 genes:   AIP*, ALK, APC*, ATM*, AXIN2*, BAP1*, BARD1*, BLM*, BMPR1A*, BRCA1*, BRCA2*, BRIP1*, CDC73*, CDH1*, CDK4, CDKN1B*, CDKN2A, CHEK2*, CTNNA1*, DICER1*, EPCAM (del/dup only), EGFR, FH*, FLCN*, GREM1 (promoter dup only), HOXB13, KIT, LZTR1, MAX*, MBD4, MEN1*, MET, MITF, MLH1*, MSH2*, MSH3*, MSH6*, MUTYH*, NF1*, NF2*, NTHL1*, PALB2*, PDGFRA, PMS2*, POLD1*, POLE*, POT1*, PRKAR1A*, PTCH1*, PTEN*, RAD51C*, RAD51D*, RB1*, RET, SDHA* (sequencing only), SDHAF2*, SDHB*, SDHC*, SDHD*, SMAD4*, SMARCA4*, SMARCB1*, SMARCE1*, STK11*, SUFU*, TMEM127*, TP53*, TSC1*, TSC2*, VHL*. RNA analysis is performed for * genes.   The test report has been scanned into EPIC and is located under the Molecular Pathology section of the Results Review tab.  A portion of the result report is included below for reference. Genetic testing reported out on 10/29/2022.      Genetic testing identified a variant of uncertain significance (VUS) in the APC gene called c.[id number].  At this time, it is unknown if this variant is associated with an increased risk for cancer or if it is benign, but most uncertain variants are reclassified to benign. It should not be used to make medical management decisions. With time, we suspect the laboratory will determine the significance of this variant, if any. If the laboratory reclassifies this variant, we will attempt to contact Jordan Robles to discuss it further. We do not recommend familial testing for the APC variant of uncertain significance (VUS).  Even though a  pathogenic variant was not identified, possible explanations for his personal history of colon polyps may include: There may be no hereditary risk for polyposis/cancer in the family. His personal history of colon polyps may be due to other genetic or environmental factors. There may be a gene mutation in one of these genes that current testing methods cannot detect, but that chance is small. There could be another gene that has not yet been discovered,  or that we have not yet tested, that is responsible for his colon polyps.  The variant of uncertain significance detected in the APC gene may be reclassified as a pathogenic variant in the future. At this time, we do not know if this variant increases the risk for cancer.  Therefore, it is important to remain in touch with cancer genetics in the future so that we can continue to offer Jordan Robles the most up to date genetic testing.   ADDITIONAL GENETIC TESTING:  We discussed with Jordan Robles that his genetic testing was fairly extensive.  If there are genes identified to increase cancer risk that can be analyzed in the future, we would be happy to discuss and coordinate this testing at that time.    CANCER SCREENING RECOMMENDATIONS:  Jordan Robles test result is considered negative (normal).  This means that we have not identified a hereditary cause for his personal history of colon polyps.  An individual's cancer risk and medical management are not determined by genetic test results alone. Overall cancer risk assessment incorporates additional factors, including personal medical history, family history, and any available genetic information that may result in a personalized plan for cancer prevention and surveillance. Therefore, it is recommended he continue to follow the cancer management and screening guidelines provided by his healthcare providers.  FOLLOW-UP:  Cancer genetics is a rapidly advancing field and it is possible that new genetic tests will be appropriate for him and/or his family members in the future. We encouraged him to remain in contact with cancer genetics on an annual basis so we can update his personal and family histories and let him know of advances in cancer genetics that may benefit this family.   Our contact number was provided. Jordan Robles questions were answered to his satisfaction, and he knows he is welcome to call us at anytime with additional questions or concerns.    Lucille Passy, MS, [facility] Genetic Counselor [email or web address] (P) [phone]

## 2022-11-13 NOTE — Telephone Encounter (Signed)
Letter sent, recall entered

## 2022-11-26 ENCOUNTER — Other Ambulatory Visit: Payer: Self-pay | Admitting: Family Medicine

## 2022-11-27 ENCOUNTER — Other Ambulatory Visit (HOSPITAL_COMMUNITY): Payer: Self-pay

## 2022-11-27 MED ORDER — AMPHETAMINE-DEXTROAMPHETAMINE 10 MG PO TABS
10.0000 mg | ORAL_TABLET | Freq: Two times a day (BID) | ORAL | 0 refills | Status: DC
  Filled 2022-11-27 – 2022-11-30 (×2): qty 60, 30d supply, fill #0

## 2022-11-28 NOTE — Telephone Encounter (Signed)
Pt is calling back and stated he want the amphetamine-dextroamphetamine (ADDERALL) 10 MG tablet sent pt  [facility] [place] [place] [address] Phone: [phone]  Fax: [phone]

## 2022-11-30 ENCOUNTER — Other Ambulatory Visit (HOSPITAL_COMMUNITY): Payer: Self-pay

## 2022-12-03 ENCOUNTER — Other Ambulatory Visit: Payer: Self-pay | Admitting: Gastroenterology

## 2022-12-03 DIAGNOSIS — R109 Unspecified abdominal pain: Secondary | ICD-10-CM

## 2022-12-04 ENCOUNTER — Encounter: Payer: Self-pay | Admitting: Gastroenterology

## 2022-12-04 ENCOUNTER — Ambulatory Visit: Payer: BC Managed Care – PPO | Admitting: Gastroenterology

## 2022-12-04 VITALS — BP 130/80 | HR 70 | Ht 68.0 in | Wt 178.1 lb

## 2022-12-04 DIAGNOSIS — K2100 Gastro-esophageal reflux disease with esophagitis, without bleeding: Secondary | ICD-10-CM | POA: Diagnosis not present

## 2022-12-04 NOTE — Patient Instructions (Addendum)
We discussed using the lowest pantoprazole possible to control your symptoms.  You should have a colonoscopy and EGD in November.   _______________________________________________________  If your blood pressure at your visit was 140/90 or greater, please contact your primary care physician to follow up on this.  _______________________________________________________  If you are age 44 or older, your body mass index should be between 23-30. Your Body mass index is 27.08 kg/m. If this is out of the aforementioned range listed, please consider follow up with your Primary Care Provider.  If you are age 79 or younger, your body mass index should be between 19-25. Your Body mass index is 27.08 kg/m. If this is out of the aformentioned range listed, please consider follow up with your Primary Care Provider.   ________________________________________________________  The [place] [facility] providers would like to encourage you to use [facility] to communicate with providers for non-urgent requests or questions.  Due to long hold times on the telephone, sending your provider a message by [facility] may be a faster and more efficient way to get a response.  Please allow 48 business hours for a response.  Please remember that this is for non-urgent requests.  _______________________________________________________

## 2022-12-04 NOTE — Progress Notes (Signed)
Referring Provider: Laurey Morale, MD Primary Care Physician:  Laurey Morale, MD   Chief Complaint: Review genetic testing   IMPRESSION:  Personal history of colon polyps. 14 tubular adenomas on colonoscopy 11/23. Genetic testing negative. Surveillance due 11/24.  LA Class D Reflux esophagitis. Associated symptoms have resolved.  Will taper pantoprazole to the lowest possible does. Plan repeat EGD 11/24 to document resolution and excluding underlying concurrent etiologies such as Barrett's Esophagus.   Carrier: genetic testing showed one pathogenic variant for BLM  associated with autosomal Bloom syndrome. No identified hereditary causes for personal history of colon polyps.     PLAN: - Discussed using the lowest dose of pantoprazole possible to control symptoms with an attempted taper - Surveillance EGD and colonoscopy 07/2023 - Low threshold for cross-sectional imaging if symptoms recur - Plan at least annual office follow-up, earlier if needed   HPI: Jordan Robles is a 42 y.o. male who returns in follow-up after endoscopic evaluation of abdominal pain, diarrhea, and unintentional weight loss.  The interval history is obtained through the patient and review of his electronic health record.  His anxiety, depression, elevated triglycerides, and a history of testicular cancer diagnosed in 1998.  He has a longstanding history of GERD but this has been well controlled on Pepcid and TUMS. Some improvement in reflux after he quit smoking over a year ago. In fact, he has been able to stop the famotidine due to infrequent symptoms.   At the time of initial consultation he reported 8 months of intermittent diffuse abdominal pain/soreness with associated bloating, diarrhea and subsequent 17 pounds of unintentional weight loss. At the peak he was having 5-6 bowel movements daily with significant urgency. Symptoms are not related to eating.  No blood or mucous in the stool. Bowel habits are  currently 1-2 times daily.  Feels like his body is "under attack." Some psychosocial stresses. Recently started treatment for anxiety and depression but his symptoms have not improved. He finds probiotics and fiber gummies are helping - [employer].   Endoscopic evaluation 07/2022 revealed LA grade D reflux esophagitis, hiatal hernia, and gastritis.  Duodenal biopsies were normal.  Gastric biopsies showed a mild reactive gastropathy.  Esophageal biopsies confirmed acute esophagitis and ulceration.  Colonoscopy revealed 15 polyps and pancolonic diverticulosis.  At least 14 of those polyps were tubular adenomas.  Random colon biopsies were negative.  Genetic testing showed one pathogenic variant for BLM  associated with autosomal Bloom syndrome.  There was no identified hereditary risk for colon cancer or polyps.  He returns in follow-up after endoscopic evaluation. Symptoms have resolved on PPI therapy. Weight has increased. He is now taking pantoprazole 40 mg QD.  GI review of systems is otherwise negative.  Labs 10/18/2022 showing normal comprehensive metabolic panel and CBC  Father with diverticulitis. There is no known family history of colon cancer or polyps. No family history of stomach cancer or other GI malignancy. No family history of inflammatory bowel disease or celiac.    Past Medical History:  Diagnosis Date   ADHD (attention deficit hyperactivity disorder)    Genital warts    Hyperlipidemia    Learning disability    Testicular cancer ([place]) 1997    Past Surgical History:  Procedure Laterality Date   COLONOSCOPY W/ BIOPSIES AND POLYPECTOMY  07/18/2022   per Dr. Tarri Glenn, 13 adenomatous polyps and 2 hyperplastic polyps. repeat in one yr   ESOPHAGOGASTRODUODENOSCOPY  07/18/2022   per Dr. Tarri Glenn, reflux esophagitis, neg  for H pylori. 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   VASECTOMY      Current Outpatient Medications  Medication Sig Dispense Refill    ALPRAZolam (XANAX) 0.5 MG tablet TAKE 1 TABLET(0.5 MG) BY MOUTH TWICE DAILY AS NEEDED FOR ANXIETY 60 tablet 5   AMBULATORY NON FORMULARY MEDICATION Medication Name: Digestive enzymes Probiotics, 3 gummies daily     amphetamine-dextroamphetamine (ADDERALL) 10 MG tablet Take 1 tablet (10 mg total) by mouth 2 (two) times daily. 60 tablet 0   atorvastatin (LIPITOR) 40 MG tablet TAKE 1 TABLET BY MOUTH DAILY 90 tablet 1   DRYSOL 20 % external solution APPLY EXTERNALLY TO THE AFFECTED AREA DAILY 60 mL 0   fenofibrate 160 MG tablet TAKE 1 TABLET(160 MG) BY MOUTH DAILY 90 tablet 3   fluticasone (FLONASE) 50 MCG/ACT nasal spray SHAKE LIQUID AND USE 2 SPRAYS IN EACH NOSTRIL DAILY 16 g 11   levocetirizine (XYZAL) 5 MG tablet TAKE 1 TABLET(5 MG) BY MOUTH EVERY EVENING 30 tablet 11   MAGNESIUM PO Take 250 mg by mouth daily at 12 noon.     metoprolol succinate (TOPROL XL) 50 MG 24 hr tablet Take 1 tablet (50 mg total) by mouth daily. Take with or immediately following a meal. 90 tablet 3   niacin (NIASPAN) 1000 MG CR tablet TAKE 1 TABLET(1000 MG) BY MOUTH AT BEDTIME 90 tablet 3   Omega-3 Fatty Acids (FISH OIL PO) Take by mouth daily.     pantoprazole (PROTONIX) 40 MG tablet TAKE 1 TABLET BY MOUTH TWICE DAILY FOR 8 WEEKS 180 tablet 0   sertraline (ZOLOFT) 100 MG tablet TAKE 1 TABLET(100 MG) BY MOUTH DAILY 90 tablet 3   temazepam (RESTORIL) 30 MG capsule TAKE 1 CAPSULE BY MOUTH EVERY DAY AT BEDTIME AS NEEDED FOR SLEEP 30 capsule 5   SUMAtriptan (IMITREX) 100 MG tablet Take 1 tablet (100 mg total) by mouth as needed for headache. May repeat in 2 hours if headache persists or recurs. (Patient not taking: Reported on 12/04/2022) 10 tablet 5   No current facility-administered medications for this visit.    Allergies as of 12/04/2022 - Review Complete 12/04/2022  Allergen Reaction Noted   Doxycycline  03/14/2015    Family History  Problem Relation Age of Onset   Cancer Sister 8       rare tumor on her chest    Lung cancer Maternal Grandfather        smoked   Ovarian cancer Paternal Grandmother    Hyperlipidemia Other    Hypertension Other    Breast cancer Cousin        paternal first cousin   Leukemia Half-Brother 7       paternal half-brother   Colon cancer Neg Hx    Rectal cancer Neg Hx    Stomach cancer Neg Hx    Esophageal cancer Neg Hx       Physical Exam: General:   Alert,  well-nourished, pleasant and cooperative in NAD Head:  Normocephalic and atraumatic. Eyes:  Sclera clear, no icterus.   Conjunctiva pink. Abdomen:  Soft, nontender, nondistended, normal bowel sounds, no rebound or guarding. No hepatosplenomegaly.   Neurologic:  Alert and  oriented x4;  grossly nonfocal Skin:  Intact without significant lesions or rashes. Psych:  Alert and cooperative. Normal mood and affect.   Ignatius Kloos L. Tarri Glenn, MD, MPH 12/04/2022, 1:16 PM

## 2022-12-05 ENCOUNTER — Other Ambulatory Visit: Payer: Self-pay | Admitting: Family Medicine

## 2022-12-12 ENCOUNTER — Other Ambulatory Visit: Payer: Self-pay | Admitting: *Deleted

## 2022-12-12 NOTE — Telephone Encounter (Signed)
[facility] LI:[id number] - [place], [place]   Is now closed.  Patient is requesting a refill of Temazepam be sent to [facility].

## 2022-12-14 ENCOUNTER — Other Ambulatory Visit: Payer: Self-pay | Admitting: Family Medicine

## 2022-12-28 ENCOUNTER — Telehealth: Payer: Self-pay | Admitting: Family Medicine

## 2022-12-28 NOTE — Telephone Encounter (Signed)
Prescription Request  12/28/2022  LOV: 10/18/2022  What is the name of the medication or equipment? amphetamine-dextroamphetamine (ADDERALL) 10 MG tablet   Have you contacted your pharmacy to request a refill? Yes   Which pharmacy would you like this sent to?   [id number] - [place], [place] - [address] AT [facility] OF [address] [place] [address] Phone: [phone] Fax: [phone]     Patient notified that their request is being sent to the clinical staff for review and that they should receive a response within 2 business days.   Please advise at Mobile [phone] (mobile)

## 2022-12-28 NOTE — Telephone Encounter (Signed)
Pt LOV was on 10/18/22 Last refill was done on 11/27/22 Please advise

## 2022-12-31 MED ORDER — AMPHETAMINE-DEXTROAMPHETAMINE 10 MG PO TABS: 10.0000 mg | ORAL_TABLET | Freq: Two times a day (BID) | ORAL | 0 refills | Status: DC

## 2022-12-31 NOTE — Addendum Note (Signed)
Addended by: Gershon Crane A on: 12/31/2022 10:14 AM   Modules accepted: Orders

## 2022-12-31 NOTE — Telephone Encounter (Signed)
Done

## 2023-01-05 ENCOUNTER — Telehealth: Payer: BC Managed Care – PPO | Admitting: Physician Assistant

## 2023-01-05 DIAGNOSIS — H1032 Unspecified acute conjunctivitis, left eye: Secondary | ICD-10-CM | POA: Diagnosis not present

## 2023-01-05 MED ORDER — OFLOXACIN 0.3 % OP SOLN
1.0000 [drp] | Freq: Four times a day (QID) | OPHTHALMIC | 0 refills | Status: AC
Start: 2023-01-05 — End: 2023-01-12

## 2023-01-05 NOTE — Progress Notes (Signed)
Virtual Visit Consent   Jordan Robles, you are scheduled for a virtual visit with a [facility] provider today. Just as with appointments in the office, your consent must be obtained to participate. Your consent will be active for this visit and any virtual visit you may have with one of our providers in the next 365 days. If you have a MyChart account, a copy of this consent can be sent to you electronically.  As this is a virtual visit, video technology does not allow for your provider to perform a traditional examination. This may limit your provider's ability to fully assess your condition. If your provider identifies any concerns that need to be evaluated in person or the need to arrange testing (such as labs, EKG, etc.), we will make arrangements to do so. Although advances in technology are sophisticated, we cannot ensure that it will always work on either your end or our end. If the connection with a video visit is poor, the visit may have to be switched to a telephone visit. With either a video or telephone visit, we are not always able to ensure that we have a secure connection.  By engaging in this virtual visit, you consent to the provision of healthcare and authorize for your insurance to be billed (if applicable) for the services provided during this visit. Depending on your insurance coverage, you may receive a charge related to this service.  I need to obtain your verbal consent now. Are you willing to proceed with your visit today? Jordan Robles has provided verbal consent on 01/05/2023 for a virtual visit (video or telephone). Tylene Fantasia Ward, PA-C  Date: 01/05/2023 11:51 AM  Virtual Visit via Video Note   I, Tylene Fantasia Ward, connected with  Jordan Robles  ([id number], 11/11/1980) on 01/05/23 at 11:45 AM EDT by a video-enabled telemedicine application and verified that I am speaking with the correct person using two identifiers.  Location: Patient: Virtual Visit Location  Patient: Home Provider: Virtual Visit Location Provider: Home Office   I discussed the limitations of evaluation and management by telemedicine and the availability of in person appointments. The patient expressed understanding and agreed to proceed.    History of Present Illness: Jordan Robles is a 42 y.o. who identifies as a male who was assigned male at birth, and is being seen today for left eye redness, drainage, and light sensitivity that started today.  Reports waking up to increased eye drainage and crusting.  He does wear contacts and reports he has not worn them today due to symptoms.  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   HYPERLIPIDEMIA 05/15/2007   LEARNING DISABILITY 05/15/2007    Allergies:  Allergies  Allergen Reactions   Doxycycline     hives   Medications:  Current Outpatient Medications:    ALPRAZolam (XANAX) 0.5 MG tablet, TAKE 1 TABLET(0.5 MG) BY MOUTH TWICE DAILY AS NEEDED FOR ANXIETY, Disp: 60 tablet, Rfl: 5   AMBULATORY NON FORMULARY MEDICATION, Medication Name: Digestive enzymes Probiotics, 3 gummies daily, Disp: , Rfl:    [  START ON 03/02/2023] amphetamine-dextroamphetamine (ADDERALL) 10 MG tablet, Take 1 tablet (10 mg total) by mouth 2 (two) times daily., Disp: 60 tablet, Rfl: 0   atorvastatin (LIPITOR) 40 MG tablet, TAKE 1 TABLET BY MOUTH DAILY, Disp: 90 tablet, Rfl: 1   DRYSOL 20 % external solution, APPLY EXTERNALLY TO THE AFFECTED AREA DAILY, Disp: 60 mL, Rfl: 0   fenofibrate 160 MG tablet, TAKE 1  TABLET(160 MG) BY MOUTH DAILY, Disp: 90 tablet, Rfl: 3   fluticasone (FLONASE) 50 MCG/ACT nasal spray, SHAKE LIQUID AND USE 2 SPRAYS IN EACH NOSTRIL DAILY, Disp: 16 g, Rfl: 11   levocetirizine (XYZAL) 5 MG tablet, TAKE 1 TABLET(5 MG) BY MOUTH EVERY EVENING, Disp: 30 tablet, Rfl: 11   MAGNESIUM PO, Take 250 mg by mouth daily at 12 noon., Disp: , Rfl:    metoprolol succinate (TOPROL XL) 50 MG 24 hr tablet, Take 1 tablet (50 mg total) by mouth daily. Take with or immediately following a meal., Disp: 90 tablet, Rfl: 3   niacin (NIASPAN) 1000 MG CR tablet, TAKE 1 TABLET(1000 MG) BY MOUTH AT BEDTIME, Disp: 90 tablet, Rfl: 3   ofloxacin (OCUFLOX) 0.3 % ophthalmic solution, Place 1 drop into the left eye 4 (four) times daily for 7 days., Disp: 5 mL, Rfl: 0   Omega-3 Fatty Acids (FISH OIL PO), Take by mouth daily., Disp: , Rfl:    pantoprazole (PROTONIX) 40 MG tablet, TAKE 1 TABLET BY MOUTH TWICE DAILY FOR 8 WEEKS, Disp: 180 tablet, Rfl: 0   sertraline (ZOLOFT) 100 MG tablet, TAKE 1 TABLET(100 MG) BY MOUTH DAILY, Disp: 90 tablet, Rfl: 3   SUMAtriptan (IMITREX) 100 MG tablet, Take 1 tablet (100 mg total) by mouth as needed for headache. May repeat in 2 hours if headache persists or recurs. (Patient not taking: Reported on 12/04/2022), Disp: 10 tablet, Rfl: 5   temazepam (RESTORIL) 30 MG capsule, TAKE 1 CAPSULE BY MOUTH EVERY DAY AT BEDTIME AS NEEDED FOR SLEEP, Disp: 30 capsule, Rfl: 5  Observations/Objective: Patient is well-developed, well-nourished in no acute distress.  Resting comfortably at home.  Head is normocephalic, atraumatic.  No labored breathing.  Speech is clear and coherent with logical content.  Patient is alert and oriented at baseline.    Assessment and Plan: 1. Acute bacterial conjunctivitis of left eye - ofloxacin (OCUFLOX) 0.3 % ophthalmic solution; Place 1 drop into the left eye 4 (four) times daily for 7 days.  Dispense: 5 mL; Refill: 0    Follow Up Instructions: I discussed  the assessment and treatment plan with the patient. The patient was provided an opportunity to ask questions and all were answered. The patient agreed with the plan and demonstrated an understanding of the instructions.  A copy of instructions were sent to the patient via MyChart unless otherwise noted below.     The patient was advised to call back or seek an in-person evaluation if the symptoms worsen or if the condition fails to improve as anticipated.  Time:  I spent 9 minutes with the patient via telehealth technology discussing the above problems/concerns.    Tylene Fantasia Ward, PA-C

## 2023-01-05 NOTE — Patient Instructions (Signed)
Jordan Robles, thank you for joining Tylene Fantasia Ward, PA-C for today's virtual visit.  While this provider is not your primary care provider (PCP), if your PCP is located in our provider database this encounter information will be shared with them immediately following your visit.   A [facility] MyChart account gives you access to today's visit and all your visits, tests, and labs performed at [facility] " click here if you don't have a [facility] MyChart account or go to [email or web address]  Consent: (Patient) Jordan Robles provided verbal consent for this virtual visit at the beginning of the encounter.  Current Medications:  Current Outpatient Medications:    ALPRAZolam (XANAX) 0.5 MG tablet, TAKE 1 TABLET(0.5 MG) BY MOUTH TWICE DAILY AS NEEDED FOR ANXIETY, Disp: 60 tablet, Rfl: 5   AMBULATORY NON FORMULARY MEDICATION, Medication Name: Digestive enzymes Probiotics, 3 gummies daily, Disp: , Rfl:    [START ON 03/02/2023] amphetamine-dextroamphetamine (ADDERALL) 10 MG tablet, Take 1 tablet (10 mg total) by mouth 2 (two) times daily., Disp: 60 tablet, Rfl: 0   atorvastatin (LIPITOR) 40 MG tablet, TAKE 1 TABLET BY MOUTH DAILY, Disp: 90 tablet, Rfl: 1   DRYSOL 20 % external solution, APPLY EXTERNALLY TO THE AFFECTED AREA DAILY, Disp: 60 mL, Rfl: 0   fenofibrate 160 MG tablet, TAKE 1 TABLET(160 MG) BY MOUTH DAILY, Disp: 90 tablet, Rfl: 3   fluticasone (FLONASE) 50 MCG/ACT nasal spray, SHAKE LIQUID AND USE 2 SPRAYS IN EACH NOSTRIL DAILY, Disp: 16 g, Rfl: 11   levocetirizine (XYZAL) 5 MG tablet, TAKE 1 TABLET(5 MG) BY MOUTH EVERY EVENING, Disp: 30 tablet, Rfl: 11   MAGNESIUM PO, Take 250 mg by mouth daily at 12 noon., Disp: , Rfl:    metoprolol succinate (TOPROL XL) 50 MG 24 hr tablet, Take 1 tablet (50 mg total) by mouth daily. Take with or immediately following a meal., Disp: 90 tablet, Rfl: 3   niacin (NIASPAN) 1000 MG CR tablet, TAKE 1 TABLET(1000 MG) BY MOUTH AT BEDTIME,  Disp: 90 tablet, Rfl: 3   ofloxacin (OCUFLOX) 0.3 % ophthalmic solution, Place 1 drop into the left eye 4 (four) times daily for 7 days., Disp: 5 mL, Rfl: 0   Omega-3 Fatty Acids (FISH OIL PO), Take by mouth daily., Disp: , Rfl:    pantoprazole (PROTONIX) 40 MG tablet, TAKE 1 TABLET BY MOUTH TWICE DAILY FOR 8 WEEKS, Disp: 180 tablet, Rfl: 0   sertraline (ZOLOFT) 100 MG tablet, TAKE 1 TABLET(100 MG) BY MOUTH DAILY, Disp: 90 tablet, Rfl: 3   SUMAtriptan (IMITREX) 100 MG tablet, Take 1 tablet (100 mg total) by mouth as needed for headache. May repeat in 2 hours if headache persists or recurs. (Patient not taking: Reported on 12/04/2022), Disp: 10 tablet, Rfl: 5   temazepam (RESTORIL) 30 MG capsule, TAKE 1 CAPSULE BY MOUTH EVERY DAY AT BEDTIME AS NEEDED FOR SLEEP, Disp: 30 capsule, Rfl: 5   Medications ordered in this encounter:  Meds ordered this encounter  Medications   ofloxacin (OCUFLOX) 0.3 % ophthalmic solution    Sig: Place 1 drop into the left eye 4 (four) times daily for 7 days.    Dispense:  5 mL    Refill:  0    Order Specific Question:   Supervising Provider    Answer:   Merrilee Jansky [[id number]]     *If you need refills on other medications prior to your next appointment, please contact your pharmacy*  Follow-Up: Call back or seek  an in-person evaluation if the symptoms worsen or if the condition fails to improve as anticipated.  [facility] [phone]  Other Instructions Use eye drops as prescribed.  Can apply cold compress.  Follow up with ophthalmologist if symptoms become worse or no improvement.    If you have been instructed to have an in-person evaluation today at a local Urgent Care facility, please use the link below. It will take you to a list of all of our available [facility], including address, phone number and hours of operation. Please do not delay care.  [facility]  If you or a family member do not have a primary  care provider, use the link below to schedule a visit and establish care. When you choose a [facility] primary care physician or advanced practice provider, you gain a long-term partner in health. Find a Primary Care Provider  Learn more about [facility]'s in-office and virtual care options: [facility] - Get Care Now

## 2023-02-15 ENCOUNTER — Encounter: Payer: Self-pay | Admitting: Family Medicine

## 2023-02-18 ENCOUNTER — Other Ambulatory Visit: Payer: Self-pay

## 2023-02-18 NOTE — Telephone Encounter (Signed)
Pt LOV was on 10/18/22 Last refill 11/02/22 Pt requesting a new Rx send to his new pharmacy

## 2023-02-19 MED ORDER — TEMAZEPAM 30 MG PO CAPS: 30.0000 mg | ORAL_CAPSULE | Freq: Every day | ORAL | 5 refills | Status: DC

## 2023-02-19 NOTE — Telephone Encounter (Signed)
Done

## 2023-02-24 ENCOUNTER — Other Ambulatory Visit: Payer: Self-pay | Admitting: Family Medicine

## 2023-03-11 ENCOUNTER — Other Ambulatory Visit: Payer: Self-pay | Admitting: Family Medicine

## 2023-03-27 ENCOUNTER — Other Ambulatory Visit: Payer: Self-pay | Admitting: Family Medicine

## 2023-03-27 MED ORDER — AMPHETAMINE-DEXTROAMPHETAMINE 10 MG PO TABS: 10.0000 mg | ORAL_TABLET | Freq: Two times a day (BID) | ORAL | 0 refills | Status: DC

## 2023-03-27 NOTE — Telephone Encounter (Signed)
Pt  LOV was on 10/18/22 Pt last refill was done on 03/02/23 Please advise

## 2023-03-27 NOTE — Telephone Encounter (Signed)
done

## 2023-04-15 ENCOUNTER — Other Ambulatory Visit: Payer: Self-pay | Admitting: Family Medicine

## 2023-05-13 ENCOUNTER — Other Ambulatory Visit: Payer: Self-pay | Admitting: Family Medicine

## 2023-06-30 ENCOUNTER — Other Ambulatory Visit: Payer: Self-pay | Admitting: Family Medicine

## 2023-07-02 NOTE — Telephone Encounter (Signed)
Pt LOV was on 10/18/22 Last refill was done on 03/27/23 Please advise

## 2023-07-03 ENCOUNTER — Encounter: Payer: Self-pay | Admitting: Family Medicine

## 2023-07-04 MED ORDER — AMPHETAMINE-DEXTROAMPHETAMINE 10 MG PO TABS: 10.0000 mg | ORAL_TABLET | Freq: Two times a day (BID) | ORAL | 0 refills | Status: DC

## 2023-07-04 NOTE — Telephone Encounter (Signed)
Done

## 2023-07-04 NOTE — Telephone Encounter (Signed)
Pt calling to check progress of refill request

## 2023-07-05 ENCOUNTER — Encounter: Payer: Self-pay | Admitting: Family Medicine

## 2023-07-18 ENCOUNTER — Telehealth: Payer: Self-pay

## 2023-07-18 DIAGNOSIS — R109 Unspecified abdominal pain: Secondary | ICD-10-CM

## 2023-07-18 MED ORDER — PANTOPRAZOLE SODIUM 40 MG PO TBEC: 40.0000 mg | DELAYED_RELEASE_TABLET | Freq: Every day | ORAL | 0 refills | Status: DC

## 2023-07-18 NOTE — Telephone Encounter (Signed)
Pantoprazole 40mg   refilled as Dr Lorin Picket E. Cunningham approved. Rajan has an ECL recall in epic for November Sir.

## 2023-07-18 NOTE — Telephone Encounter (Signed)
Please advise as DOD on patient's pantoprazole refill Sir. Monique was seen in March 2024 by Dr Orvan Falconer. Thank you.

## 2023-08-04 ENCOUNTER — Encounter: Payer: Self-pay | Admitting: Family Medicine

## 2023-08-04 DIAGNOSIS — Z860100 Personal history of colon polyps, unspecified: Secondary | ICD-10-CM

## 2023-08-06 ENCOUNTER — Encounter: Payer: Self-pay | Admitting: Family Medicine

## 2023-08-23 NOTE — Telephone Encounter (Signed)
Done

## 2023-08-30 ENCOUNTER — Other Ambulatory Visit: Payer: Self-pay | Admitting: Family Medicine

## 2023-08-30 DIAGNOSIS — J310 Chronic rhinitis: Secondary | ICD-10-CM

## 2023-09-06 ENCOUNTER — Other Ambulatory Visit: Payer: Self-pay | Admitting: Family Medicine

## 2023-09-09 ENCOUNTER — Other Ambulatory Visit: Payer: Self-pay | Admitting: Family Medicine

## 2023-09-19 ENCOUNTER — Telehealth: Payer: Self-pay | Admitting: Gastroenterology

## 2023-09-19 ENCOUNTER — Encounter: Payer: Self-pay | Admitting: Gastroenterology

## 2023-10-04 ENCOUNTER — Other Ambulatory Visit: Payer: Self-pay | Admitting: Family Medicine

## 2023-10-04 ENCOUNTER — Other Ambulatory Visit: Payer: Self-pay | Admitting: Gastroenterology

## 2023-10-04 DIAGNOSIS — R109 Unspecified abdominal pain: Secondary | ICD-10-CM

## 2023-10-07 ENCOUNTER — Telehealth: Payer: Self-pay | Admitting: Family Medicine

## 2023-10-07 NOTE — Telephone Encounter (Unsigned)
Copied from CRM [id number]. Topic: Clinical - Medication Refill >> Oct 07, 2023 11:06 AM Florestine Avers wrote: Most Recent Primary Care Visit:  Provider: Gershon Crane A  Department: [facility]  Visit Type: PHYSICAL  Date: 10/18/2022  Medication: amphetamine-dextroamphetamine (ADDERALL) 10 MG tablet  Has the patient contacted their pharmacy? No (Agent: If no, request that the patient contact the pharmacy for the refill. If patient does not wish to contact the pharmacy document the reason why and proceed with request.) (Agent: If yes, when and what did the pharmacy advise?)  Is this the correct pharmacy for this prescription? Yes If no, delete pharmacy and type the correct one.  This is the patient's preferred pharmacy:  [id number] - [place], [place] - [address] AT [facility] [address] [place] [address] Phone: [phone] Fax: [phone]   Has the prescription been filled recently? Yes  Is the patient out of the medication? Yes  Has the patient been seen for an appointment in the last year OR does the patient have an upcoming appointment? No  Can we respond through MyChart? Yes  Agent: Please be advised that Rx refills may take up to 3 business days. We ask that you follow-up with your pharmacy.

## 2023-10-07 NOTE — Telephone Encounter (Unsigned)
Copied from CRM [id number]. Topic: Clinical - Prescription Issue >> Oct 07, 2023 11:08 AM Florestine Avers wrote: Reason for CRM: Patient called in to inquire about his medication. I saw that a prescription is pending for his amphetamine-dextroamphetamine (ADDERALL) 10 MG tablet. Also told patient to follow up with his pharmacy to ensure they received the prescription.

## 2023-10-08 MED ORDER — AMPHETAMINE-DEXTROAMPHETAMINE 10 MG PO TABS: 10.0000 mg | ORAL_TABLET | Freq: Two times a day (BID) | ORAL | 0 refills | Status: DC

## 2023-10-08 NOTE — Telephone Encounter (Signed)
Done

## 2023-10-11 ENCOUNTER — Ambulatory Visit (AMBULATORY_SURGERY_CENTER): Payer: BC Managed Care – PPO | Admitting: *Deleted

## 2023-10-11 VITALS — Ht 68.0 in | Wt 165.0 lb

## 2023-10-11 DIAGNOSIS — R109 Unspecified abdominal pain: Secondary | ICD-10-CM

## 2023-10-11 DIAGNOSIS — K219 Gastro-esophageal reflux disease without esophagitis: Secondary | ICD-10-CM

## 2023-10-11 DIAGNOSIS — Z860100 Personal history of colon polyps, unspecified: Secondary | ICD-10-CM

## 2023-10-11 MED ORDER — NA SULFATE-K SULFATE-MG SULF 17.5-3.13-1.6 GM/177ML PO SOLN: 1.0000 | Freq: Once | ORAL | 0 refills | Status: AC

## 2023-10-11 MED ORDER — PANTOPRAZOLE SODIUM 40 MG PO TBEC: 40.0000 mg | DELAYED_RELEASE_TABLET | Freq: Every day | ORAL | 0 refills | Status: DC

## 2023-10-11 NOTE — Progress Notes (Signed)
Pre visit completed over telephone. Instructions through MyChart. Patient preferred using [facility]   No egg or soy allergy known to patient  No issues known to pt with past sedation with any surgeries or procedures Patient denies ever being told they had issues or difficulty with intubation  No FH of Malignant Hyperthermia Pt is not on diet pills Pt is not on  home 02  Pt is not on blood thinners  Pt denies issues with constipation  No A fib or A flutter Have any cardiac testing pending--no Pt instructed to use [email or web address] or GoodRx for a price reduction on prep

## 2023-10-14 ENCOUNTER — Other Ambulatory Visit: Payer: Self-pay | Admitting: Family Medicine

## 2023-10-17 ENCOUNTER — Encounter: Payer: Self-pay | Admitting: Gastroenterology

## 2023-10-18 ENCOUNTER — Ambulatory Visit (INDEPENDENT_AMBULATORY_CARE_PROVIDER_SITE_OTHER): Payer: BC Managed Care – PPO | Admitting: Family Medicine

## 2023-10-18 ENCOUNTER — Encounter: Payer: Self-pay | Admitting: Family Medicine

## 2023-10-18 VITALS — BP 120/82 | HR 73 | Temp 97.9°F | Ht 68.5 in | Wt 173.6 lb

## 2023-10-18 DIAGNOSIS — Z0000 Encounter for general adult medical examination without abnormal findings: Secondary | ICD-10-CM | POA: Diagnosis not present

## 2023-10-18 DIAGNOSIS — I10 Essential (primary) hypertension: Secondary | ICD-10-CM | POA: Diagnosis not present

## 2023-10-18 LAB — CBC WITH DIFFERENTIAL/PLATELET
Basophils Absolute: 0.1 10*3/uL (ref 0.0–0.1)
Basophils Relative: 1.1 % (ref 0.0–3.0)
Eosinophils Absolute: 0.1 10*3/uL (ref 0.0–0.7)
Eosinophils Relative: 1.4 % (ref 0.0–5.0)
HCT: 49 % (ref 39.0–52.0)
Hemoglobin: 16.2 g/dL (ref 13.0–17.0)
Lymphocytes Relative: 20.3 % (ref 12.0–46.0)
Lymphs Abs: 2.1 10*3/uL (ref 0.7–4.0)
MCHC: 33.1 g/dL (ref 30.0–36.0)
MCV: 97.3 fL (ref 78.0–100.0)
Monocytes Absolute: 0.7 10*3/uL (ref 0.1–1.0)
Monocytes Relative: 7.2 % (ref 3.0–12.0)
Neutro Abs: 7.2 10*3/uL (ref 1.4–7.7)
Neutrophils Relative %: 70 % (ref 43.0–77.0)
Platelets: 358 10*3/uL (ref 150.0–400.0)
RBC: 5.04 Mil/uL (ref 4.22–5.81)
RDW: 13.5 % (ref 11.5–15.5)
WBC: 10.3 10*3/uL (ref 4.0–10.5)

## 2023-10-18 LAB — BASIC METABOLIC PANEL
BUN: 13 mg/dL (ref 6–23)
CO2: 27 meq/L (ref 19–32)
Calcium: 9.3 mg/dL (ref 8.4–10.5)
Chloride: 103 meq/L (ref 96–112)
Creatinine, Ser: 1 mg/dL (ref 0.40–1.50)
GFR: 91.22 mL/min (ref >60.00)
Glucose, Bld: 90 mg/dL (ref 70–99)
Potassium: 4.5 meq/L (ref 3.5–5.1)
Sodium: 141 meq/L (ref 135–145)

## 2023-10-18 LAB — HEPATIC FUNCTION PANEL
ALT: 33 U/L (ref 0–53)
AST: 38 U/L — ABNORMAL HIGH (ref 0–37)
Albumin: 4.7 g/dL (ref 3.5–5.2)
Alkaline Phosphatase: 31 U/L — ABNORMAL LOW (ref 39–117)
Bilirubin, Direct: 0.2 mg/dL (ref 0.0–0.3)
Total Bilirubin: 0.8 mg/dL (ref 0.2–1.2)
Total Protein: 6.8 g/dL (ref 6.0–8.3)

## 2023-10-18 LAB — LIPID PANEL
Cholesterol: 152 mg/dL (ref 0–200)
HDL: 65.9 mg/dL (ref >39.00)
LDL Cholesterol: 39 mg/dL (ref 0–99)
NonHDL: 86.52
Total CHOL/HDL Ratio: 2
Triglycerides: 237 mg/dL — ABNORMAL HIGH (ref 0.0–149.0)
VLDL: 47.4 mg/dL — ABNORMAL HIGH (ref 0.0–40.0)

## 2023-10-18 LAB — TSH: TSH: 3.2 u[IU]/mL (ref 0.35–5.50)

## 2023-10-18 LAB — PSA: PSA: 0.62 ng/mL (ref 0.10–4.00)

## 2023-10-18 LAB — HEMOGLOBIN A1C: Hgb A1c MFr Bld: 5.6 % (ref 4.6–6.5)

## 2023-10-18 MED ORDER — METOPROLOL SUCCINATE ER 100 MG PO TB24: 100.0000 mg | ORAL_TABLET | Freq: Every day | ORAL | 3 refills | Status: DC

## 2023-10-18 NOTE — Progress Notes (Signed)
Subjective:    Patient ID: Jordan Robles, male    DOB: 12-07-1980, 43 y.o.   MRN: [id number]  HPI Here for a well exam. He is doing well in general. His BP had been going up to the 140's systolic, so he has taken 2 tablets of Metoprolol a day for the past 5 days.    Review of Systems  Constitutional: Negative.   HENT: Negative.    Eyes: Negative.   Respiratory: Negative.    Cardiovascular: Negative.   Gastrointestinal: Negative.   Genitourinary: Negative.   Musculoskeletal: Negative.   Skin: Negative.   Neurological: Negative.   Psychiatric/Behavioral: Negative.         Objective:   Physical Exam Constitutional:      General: He is not in acute distress.    Appearance: Normal appearance. He is well-developed. He is not diaphoretic.  HENT:     Head: Normocephalic and atraumatic.     Right Ear: External ear normal.     Left Ear: External ear normal.     Nose: Nose normal.     Mouth/Throat:     Pharynx: No oropharyngeal exudate.  Eyes:     General: No scleral icterus.       Right eye: No discharge.        Left eye: No discharge.     Conjunctiva/sclera: Conjunctivae normal.     Pupils: Pupils are equal, round, and reactive to light.  Neck:     Thyroid: No thyromegaly.     Vascular: No JVD.     Trachea: No tracheal deviation.  Cardiovascular:     Rate and Rhythm: Normal rate and regular rhythm.     Pulses: Normal pulses.     Heart sounds: Normal heart sounds. No murmur heard.    No friction rub. No gallop.  Pulmonary:     Effort: Pulmonary effort is normal. No respiratory distress.     Breath sounds: Normal breath sounds. No wheezing or rales.  Chest:     Chest wall: No tenderness.  Abdominal:     General: Bowel sounds are normal. There is no distension.     Palpations: Abdomen is soft. There is no mass.     Tenderness: There is no abdominal tenderness. There is no guarding or rebound.  Genitourinary:    Penis: Normal. No tenderness.      Testes: Normal.      Prostate: Normal.     Rectum: Normal. Guaiac result negative.  Musculoskeletal:        General: No tenderness. Normal range of motion.     Cervical back: Neck supple.  Lymphadenopathy:     Cervical: No cervical adenopathy.  Skin:    General: Skin is warm and dry.     Coloration: Skin is not pale.     Findings: No erythema or rash.  Neurological:     General: No focal deficit present.     Mental Status: He is alert and oriented to person, place, and time.     Cranial Nerves: No cranial nerve deficit.     Motor: No abnormal muscle tone.     Coordination: Coordination normal.     Deep Tendon Reflexes: Reflexes are normal and symmetric. Reflexes normal.  Psychiatric:        Mood and Affect: Mood normal.        Behavior: Behavior normal.        Thought Content: Thought content normal.        Judgment:  Judgment normal.           Assessment & Plan:  Well exam. We discussed diet and exercise. Get fasting labs. His HTN is now well controlled, so we will increase the Metoprolol succinate to 100 mg daily. He is scheduled for a repeat colonoscopy on 10-25-23.  Gershon Crane, MD

## 2023-10-23 ENCOUNTER — Other Ambulatory Visit: Payer: Self-pay | Admitting: Medical Genetics

## 2023-10-25 ENCOUNTER — Ambulatory Visit (AMBULATORY_SURGERY_CENTER): Payer: BC Managed Care – PPO | Admitting: Gastroenterology

## 2023-10-25 ENCOUNTER — Encounter: Payer: Self-pay | Admitting: Gastroenterology

## 2023-10-25 VITALS — BP 140/90 | HR 67 | Temp 98.1°F | Resp 17 | Ht 68.0 in | Wt 165.0 lb

## 2023-10-25 DIAGNOSIS — K644 Residual hemorrhoidal skin tags: Secondary | ICD-10-CM

## 2023-10-25 DIAGNOSIS — K648 Other hemorrhoids: Secondary | ICD-10-CM | POA: Diagnosis not present

## 2023-10-25 DIAGNOSIS — K2100 Gastro-esophageal reflux disease with esophagitis, without bleeding: Secondary | ICD-10-CM

## 2023-10-25 DIAGNOSIS — D123 Benign neoplasm of transverse colon: Secondary | ICD-10-CM

## 2023-10-25 DIAGNOSIS — K449 Diaphragmatic hernia without obstruction or gangrene: Secondary | ICD-10-CM | POA: Diagnosis not present

## 2023-10-25 DIAGNOSIS — K31A19 Gastric intestinal metaplasia without dysplasia, unspecified site: Secondary | ICD-10-CM | POA: Diagnosis not present

## 2023-10-25 DIAGNOSIS — K3189 Other diseases of stomach and duodenum: Secondary | ICD-10-CM

## 2023-10-25 DIAGNOSIS — Z1211 Encounter for screening for malignant neoplasm of colon: Secondary | ICD-10-CM | POA: Diagnosis not present

## 2023-10-25 DIAGNOSIS — Z860101 Personal history of adenomatous and serrated colon polyps: Secondary | ICD-10-CM | POA: Diagnosis not present

## 2023-10-25 DIAGNOSIS — K5730 Diverticulosis of large intestine without perforation or abscess without bleeding: Secondary | ICD-10-CM | POA: Diagnosis not present

## 2023-10-25 DIAGNOSIS — K2980 Duodenitis without bleeding: Secondary | ICD-10-CM | POA: Diagnosis not present

## 2023-10-25 DIAGNOSIS — Z860100 Personal history of colon polyps, unspecified: Secondary | ICD-10-CM

## 2023-10-25 MED ORDER — PANTOPRAZOLE SODIUM 20 MG PO TBEC: DELAYED_RELEASE_TABLET | ORAL | 3 refills | Status: DC

## 2023-10-25 MED ORDER — SODIUM CHLORIDE 0.9 % IV SOLN: 500.0000 mL | Freq: Once | INTRAVENOUS | Status: DC

## 2023-10-25 NOTE — Op Note (Signed)
 [facility] Patient Name: Jordan Robles Procedure Date: 10/25/2023 3:20 PM MRN: [id number] Endoscopist: Gustav ALONSO Mcgee , MD, [id number] Age: 43 Referring MD:  Date of Birth: Oct 14, 1980 Gender: Male Account #: [id number] Procedure:                Colonoscopy Indications:              High risk colon cancer surveillance: Personal                            history of colonic polyps, Surveillance: History of                            numerous (> 10) adenomas on last colonoscopy (< 3                            yrs) Medicines:                Monitored Anesthesia Care Procedure:                Pre-Anesthesia Assessment:                           - Prior to the procedure, a History and Physical                            was performed, and patient medications and                            allergies were reviewed. The patient's tolerance of                            previous anesthesia was also reviewed. The risks                            and benefits of the procedure and the sedation                            options and risks were discussed with the patient.                            All questions were answered, and informed consent                            was obtained. Prior Anticoagulants: The patient has                            taken no anticoagulant or antiplatelet agents. ASA                            Grade Assessment: II - A patient with mild systemic                            disease. After reviewing the risks and benefits,  the patient was deemed in satisfactory condition to                            undergo the procedure.                           After obtaining informed consent, the colonoscope                            was passed under direct vision. Throughout the                            procedure, the patient's blood pressure, pulse, and                            oxygen saturations were monitored continuously. The                             Olympus Scope SN: [id number] was introduced through                            the anus and advanced to the the cecum, identified                            by appendiceal orifice and ileocecal valve. The                            colonoscopy was performed without difficulty. The                            patient tolerated the procedure well. The quality                            of the bowel preparation was adequate to identify                            polyps greater than 5 mm in size. The ileocecal                            valve, appendiceal orifice, and rectum were                            photographed. Scope In: 3:41:42 PM Scope Out: 3:57:07 PM Scope Withdrawal Time: 0 hours 10 minutes 1 second  Total Procedure Duration: 0 hours 15 minutes 25 seconds  Findings:                 The perianal and digital rectal examinations were                            normal.                           Two sessile polyps were found in the transverse  colon. The polyps were 1 to 2 mm in size. These                            polyps were removed with a cold biopsy forceps.                            Resection and retrieval were complete.                           Scattered small-mouthed diverticula were found in                            the sigmoid colon and descending colon.                           Non-bleeding external and internal hemorrhoids were                            found during retroflexion. The hemorrhoids were                            medium-sized. Complications:            No immediate complications. Estimated Blood Loss:     Estimated blood loss was minimal. Impression:               - Two 1 to 2 mm polyps in the transverse colon,                            removed with a cold biopsy forceps. Resected and                            retrieved.                           - Diverticulosis in the sigmoid colon and in the                             descending colon.                           - Non-bleeding external and internal hemorrhoids. Recommendation:           - Patient has a contact number available for                            emergencies. The signs and symptoms of potential                            delayed complications were discussed with the                            patient. Return to normal activities tomorrow.                            Written discharge instructions were provided  to the                            patient.                           - Resume previous diet.                           - Continue present medications.                           - Await pathology results.                           - Repeat colonoscopy in 5 years for surveillance                            based on pathology results. [name] V. Janal Haak, MD 10/25/2023 4:18:16 PM This report has been signed electronically.

## 2023-10-25 NOTE — Progress Notes (Signed)
 [facility] History and Physical   Primary Care Physician:  Johnny Garnette LABOR, MD   Reason for Procedure:  H/o multiple adenomatous colon polyps and erosive esophagitis  Plan:    EGD and colonoscopy with possible interventions as needed     HPI: Jordan Robles is a very pleasant 43 y.o. male here for EGD and colonoscopy for h/o multiple adenomatous colon polyps and erosive esophagitis. Denies any nausea, vomiting, abdominal pain, melena or bright red blood per rectum  The risks and benefits as well as alternatives of endoscopic procedure(s) have been discussed and reviewed. All questions answered. The patient agrees to proceed.    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 Eda, 13 adenomatous polyps and 2 hyperplastic polyps. repeat in one yr   ESOPHAGOGASTRODUODENOSCOPY  07/18/2022   per Dr. Eda, reflux esophagitis, neg for H pylori. 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   VASECTOMY      Prior to Admission medications   Medication Sig Start Date End Date Taking? Authorizing Provider  ALPRAZolam  (XANAX ) 0.5 MG tablet TAKE 1 TABLET(0.5 MG) BY MOUTH TWICE DAILY AS NEEDED FOR ANXIETY 05/16/23  Yes Johnny Garnette LABOR, MD  AMBULATORY NON FORMULARY MEDICATION Medication Name: Digestive enzymes Probiotics, 3 gummies daily   Yes [provider]  amphetamine -dextroamphetamine  (ADDERALL) 10 MG tablet Take 1 tablet (10 mg total) by mouth 2 (two) times daily. 10/08/23  Yes Johnny Garnette LABOR, MD  atorvastatin  (LIPITOR) 40 MG tablet TAKE 1 TABLET BY MOUTH DAILY 08/30/23  Yes Johnny Garnette LABOR, MD  fenofibrate  160 MG tablet TAKE 1 TABLET(160 MG)  BY MOUTH DAILY 09/09/23  Yes Johnny Garnette LABOR, MD  fluticasone  (FLONASE ) 50 MCG/ACT nasal spray SHAKE LIQUID AND USE 2 SPRAYS IN EACH NOSTRIL DAILY 10/18/22  Yes Johnny Garnette LABOR, MD  levocetirizine (XYZAL ) 5 MG tablet TAKE 1 TABLET(5 MG) BY MOUTH EVERY EVENING 08/30/23  Yes Johnny Garnette LABOR, MD  MAGNESIUM PO Take 250 mg by mouth daily at 12 noon.   Yes [provider]  metoprolol  succinate (TOPROL -XL) 100 MG 24 hr tablet Take 1 tablet (100 mg total) by mouth daily. Take with or immediately following a meal. 10/18/23  Yes Johnny Garnette LABOR, MD  niacin  (NIASPAN ) 1000 MG CR tablet TAKE 1 TABLET(1000 MG) BY MOUTH AT BEDTIME 02/25/23  Yes Johnny Garnette LABOR, MD  Omega-3 Fatty Acids (FISH OIL PO) Take by mouth daily.   Yes [provider]  pantoprazole  (PROTONIX ) 40 MG tablet Take 1 tablet (40 mg total) by mouth daily before breakfast. 10/11/23  Yes Stacia Glendia BRAVO, MD  sertraline  (ZOLOFT ) 50 MG tablet TAKE 1 TABLET(50 MG) BY MOUTH DAILY 04/17/23  Yes Johnny Garnette LABOR, MD  temazepam  (RESTORIL ) 30 MG capsule TAKE 1 CAPSULE(30 MG) BY MOUTH AT BEDTIME 09/12/23  Yes Johnny Garnette LABOR, MD  DRYSOL 20 % external solution APPLY EXTERNALLY TO THE AFFECTED AREA DAILY Patient not taking: Reported on 10/25/2023 05/15/18   Johnny Garnette LABOR, MD    Current Outpatient Medications  Medication Sig Dispense Refill   ALPRAZolam  (XANAX ) 0.5 MG tablet TAKE 1 TABLET(0.5 MG) BY MOUTH TWICE DAILY AS  NEEDED FOR ANXIETY 60 tablet 5   AMBULATORY NON FORMULARY MEDICATION Medication Name: Digestive enzymes Probiotics, 3 gummies daily     amphetamine -dextroamphetamine  (ADDERALL) 10 MG tablet Take 1 tablet (10 mg total) by mouth 2 (two) times daily. 60 tablet 0   atorvastatin  (LIPITOR) 40 MG tablet TAKE 1 TABLET BY MOUTH DAILY 90 tablet 1   fenofibrate  160 MG tablet TAKE 1 TABLET(160 MG) BY MOUTH DAILY 90 tablet 3   fluticasone  (FLONASE ) 50 MCG/ACT nasal spray SHAKE LIQUID AND USE 2 SPRAYS IN EACH NOSTRIL DAILY 16 g 11   levocetirizine  (XYZAL ) 5 MG tablet TAKE 1 TABLET(5 MG) BY MOUTH EVERY EVENING 30 tablet 11   MAGNESIUM PO Take 250 mg by mouth daily at 12 noon.     metoprolol  succinate (TOPROL -XL) 100 MG 24 hr tablet Take 1 tablet (100 mg total) by mouth daily. Take with or immediately following a meal. 90 tablet 3   niacin  (NIASPAN ) 1000 MG CR tablet TAKE 1 TABLET(1000 MG) BY MOUTH AT BEDTIME 90 tablet 3   Omega-3 Fatty Acids (FISH OIL PO) Take by mouth daily.     pantoprazole  (PROTONIX ) 40 MG tablet Take 1 tablet (40 mg total) by mouth daily before breakfast. 90 tablet 0   sertraline  (ZOLOFT ) 50 MG tablet TAKE 1 TABLET(50 MG) BY MOUTH DAILY 90 tablet 3   temazepam  (RESTORIL ) 30 MG capsule TAKE 1 CAPSULE(30 MG) BY MOUTH AT BEDTIME 30 capsule 5   DRYSOL 20 % external solution APPLY EXTERNALLY TO THE AFFECTED AREA DAILY (Patient not taking: Reported on 10/25/2023) 60 mL 0   Current Facility-Administered Medications  Medication Dose Route Frequency Provider Last Rate Last Admin   0.9 %  sodium chloride  infusion  500 mL Intravenous Once Thu Baggett V, MD        Allergies as of 10/25/2023 - Review Complete 10/25/2023  Allergen Reaction Noted   Doxycycline Hives 03/14/2015    Family History  Problem Relation Age of Onset   Cancer Sister 8       rare tumor on her chest   Lung cancer Maternal Grandfather        smoked   Ovarian cancer Paternal Grandmother    Hyperlipidemia Other    Hypertension Other    Breast cancer Cousin        paternal first cousin   Leukemia Half-Brother 7       paternal half-brother   Colon cancer Neg Hx    Rectal cancer Neg Hx    Stomach cancer Neg Hx    Esophageal cancer Neg Hx     Social History   Socioeconomic History   Marital status: Married    Spouse name: Not on file   Number of children: Not on file   Years of education: Not on file   Highest education level: Master's degree (e.g., MA, MS, MEng, MEd, MSW, MBA)  Occupational History   Not on file  Tobacco Use   Smoking  status: Former    Current packs/day: 0.00    Types: Cigarettes    Quit date: 07/18/2017    Years since quitting: 6.2   Smokeless tobacco: Never   Tobacco comments:    5 CIGARETTES DAILY   Vaping Use   Vaping status: Never Used  Substance and Sexual Activity   Alcohol use: Yes    Alcohol/week: 4.0 standard drinks of alcohol    Types: 4 Cans of beer per week   Drug use: No   Sexual activity: Not on  file  Other Topics Concern   Not on file  Social History Narrative   Not on file   Social Drivers of Health   Financial Resource Strain: Medium Risk (10/14/2023)   Overall Financial Resource Strain (CARDIA)    Difficulty of Paying Living Expenses: Somewhat hard  Food Insecurity: No Food Insecurity (10/14/2023)   Hunger Vital Sign    Worried About Running Out of Food in the Last Year: Never true    Ran Out of Food in the Last Year: Never true  Transportation Needs: No Transportation Needs (10/14/2023)   PRAPARE - Administrator, Civil Service (Medical): No    Lack of Transportation (Non-Medical): No  Physical Activity: Insufficiently Active (10/14/2023)   Exercise Vital Sign    Days of Exercise per Week: 4 days    Minutes of Exercise per Session: 20 min  Stress: Stress Concern Present (10/14/2023)   [employer] of Occupational Health - Occupational Stress Questionnaire    Feeling of Stress : To some extent  Social Connections: Moderately Integrated (10/14/2023)   Social Connection and Isolation Panel [NHANES]    Frequency of Communication with Friends and Family: More than three times a week    Frequency of Social Gatherings with Friends and Family: Three times a week    Attends Religious Services: Never    Active Member of Clubs or Organizations: Yes    Attends Banker Meetings: 1 to 4 times per year    Marital Status: Married  Catering Manager Violence: Not on file    Review of Systems:  All other review of systems negative except as mentioned in  the HPI.  Physical Exam: Vital signs in last 24 hours: BP (!) 160/112   Pulse 82   Temp 98.1 F (36.7 C) (Skin)   Ht 5' 8 (1.727 m)   Wt 165 lb (74.8 kg)   SpO2 99%   BMI 25.09 kg/m  General:   Alert, NAD Lungs:  Clear .   Heart:  Regular rate and rhythm Abdomen:  Soft, nontender and nondistended. Neuro/Psych:  Alert and cooperative. Normal mood and affect. A and O x 3  Reviewed labs, radiology imaging, old records and pertinent past GI work up  Patient is appropriate for planned procedure(s) and anesthesia in an ambulatory setting   K. Veena Serena Petterson , MD [phone]

## 2023-10-25 NOTE — Progress Notes (Signed)
 Sedate, gd SR, tolerated procedure well, VSS, report to RN

## 2023-10-25 NOTE — Progress Notes (Signed)
 Called to room to assist during endoscopic procedure.  Patient ID and intended procedure confirmed with present staff. Received instructions for my participation in the procedure from the performing physician.

## 2023-10-25 NOTE — Op Note (Signed)
 [facility] Patient Name: Jordan Robles Procedure Date: 10/25/2023 3:20 PM MRN: [id number] Endoscopist: Gustav ALONSO Mcgee , MD, [id number] Age: 43 Referring MD:  Date of Birth: 1981-01-14 Gender: Male Account #: [id number] Procedure:                Upper GI endoscopy Indications:              Dyspepsia, Follow-up of reflux esophagitis Medicines:                Monitored Anesthesia Care Procedure:                Pre-Anesthesia Assessment:                           - Prior to the procedure, a History and Physical                            was performed, and patient medications and                            allergies were reviewed. The patient's tolerance of                            previous anesthesia was also reviewed. The risks                            and benefits of the procedure and the sedation                            options and risks were discussed with the patient.                            All questions were answered, and informed consent                            was obtained. Prior Anticoagulants: The patient has                            taken no anticoagulant or antiplatelet agents. ASA                            Grade Assessment: II - A patient with mild systemic                            disease. After reviewing the risks and benefits,                            the patient was deemed in satisfactory condition to                            undergo the procedure.                           After obtaining informed consent, the endoscope was  passed under direct vision. Throughout the                            procedure, the patient's blood pressure, pulse, and                            oxygen saturations were monitored continuously. The                            GIF [id number] #[id number] was introduced through the                            mouth, and advanced to the second part of duodenum.                            The upper  GI endoscopy was accomplished without                            difficulty. The patient tolerated the procedure                            well. Scope In: Scope Out: Findings:                 There were esophageal mucosal changes suggestive of                            short-segment Barrett's esophagus present in the                            lower third of the esophagus. The maximum                            longitudinal extent of these mucosal changes was 3                            cm in length. Mucosa was biopsied with a cold                            forceps for histology. One specimen bottle was sent                            to pathology.                           A 2 cm hiatal hernia was present.                           The stomach was normal.                           The cardia and gastric fundus were normal on                            retroflexion.  Mucosal flattening was found in the first portion                            of the duodenum and in the second portion of the                            duodenum. Biopsies were taken with a cold forceps                            for histology. Biopsies for histology were taken                            with a cold forceps for evaluation of celiac                            disease. Complications:            No immediate complications. Estimated Blood Loss:     Estimated blood loss was minimal. Impression:               - Esophageal mucosal changes suggestive of                            short-segment Barrett's esophagus. Biopsied.                           - 2 cm hiatal hernia.                           - Normal stomach.                           - Flattened mucosa was found in the duodenum,                            suspicious for celiac disease. Biopsied. Recommendation:           - Patient has a contact number available for                            emergencies. The signs and symptoms  of potential                            delayed complications were discussed with the                            patient. Return to normal activities tomorrow.                            Written discharge instructions were provided to the                            patient.                           - Resume previous diet.                           -  Continue present medications.                           - Await pathology results.                           - Follow an antireflux regimen.                           - Use Protonix  (pantoprazole ) 20 mg PO BID. Rx for                            90 days with 3 refills [name] V. Haillee Johann, MD 10/25/2023 4:05:18 PM This report has been signed electronically.

## 2023-10-25 NOTE — Patient Instructions (Addendum)
 Endoscopy :  Await results of biopsies done ,esophagus and duodenum   Use Protonix  20 mg twice a day or 90 days with 3 refills - sent to your pharmacy for pick up  Handout given to you on GERD ( anti reflux regimen )    Colonoscopy:  Handouts on polyps,diverticulosis,& hemorrhoids given to you today.   Await pathology results on polyps removed    Continue previous diet & medicationsYOU HAD AN ENDOSCOPIC PROCEDURE TODAY AT [facility]:   Refer to the procedure report that was given to you for any specific questions about what was found during the examination.  If the procedure report does not answer your questions, please call your gastroenterologist to clarify.  If you requested that your care partner not be given the details of your procedure findings, then the procedure report has been included in a sealed envelope for you to review at your convenience later.  YOU SHOULD EXPECT: Some feelings of bloating in the abdomen. Passage of more gas than usual.  Walking can help get rid of the air that was put into your GI tract during the procedure and reduce the bloating. If you had a lower endoscopy (such as a colonoscopy or flexible sigmoidoscopy) you may notice spotting of blood in your stool or on the toilet paper. If you underwent a bowel prep for your procedure, you may not have a normal bowel movement for a few days.  Please Note:  You might notice some irritation and congestion in your nose or some drainage.  This is from the oxygen used during your procedure.  There is no need for concern and it should clear up in a day or so.  SYMPTOMS TO REPORT IMMEDIATELY:  Following lower endoscopy (colonoscopy or flexible sigmoidoscopy):  Excessive amounts of blood in the stool  Significant tenderness or worsening of abdominal pains  Swelling of the abdomen that is new, acute  Fever of 100F or higher  Following upper endoscopy (EGD)  Vomiting of blood or coffee ground  material  New chest pain or pain under the shoulder blades  Painful or persistently difficult swallowing  New shortness of breath  Fever of 100F or higher  Black, tarry-looking stools  For urgent or emergent issues, a gastroenterologist can be reached at any hour by calling [phone]. Do not use MyChart messaging for urgent concerns.    DIET:  We do recommend a small meal at first, but then you may proceed to your regular diet.  Drink plenty of fluids but you should avoid alcoholic beverages for 24 hours.  ACTIVITY:  You should plan to take it easy for the rest of today and you should NOT DRIVE or use heavy machinery until tomorrow (because of the sedation medicines used during the test).    FOLLOW UP: Our staff will call the number listed on your records the next business day following your procedure.  We will call around 7:15- 8:00 am to check on you and address any questions or concerns that you may have regarding the information given to you following your procedure. If we do not reach you, we will leave a message.     If any biopsies were taken you will be contacted by phone or by letter within the next 1-3 weeks.  Please call us  at [phone] if you have not heard about the biopsies in 3 weeks.    SIGNATURES/CONFIDENTIALITY: You and/or your care partner have signed paperwork which will be  entered into your electronic medical record.  These signatures attest to the fact that that the information above on your After Visit Summary has been reviewed and is understood.  Full responsibility of the confidentiality of this discharge information lies with you and/or your care-partner.

## 2023-10-25 NOTE — Progress Notes (Signed)
 Pt's states no medical or surgical changes since previsit or office visit.

## 2023-10-28 ENCOUNTER — Telehealth: Payer: Self-pay

## 2023-10-28 NOTE — Telephone Encounter (Signed)
 Post procedure follow up call, no answer

## 2023-10-30 LAB — SURGICAL PATHOLOGY

## 2023-11-01 ENCOUNTER — Other Ambulatory Visit (HOSPITAL_COMMUNITY)
Admission: RE | Admit: 2023-11-01 | Discharge: 2023-11-01 | Disposition: A | Discharge status: Home / Self Care | Payer: Self-pay | Source: Ambulatory Visit | Attending: Oncology | Admitting: Oncology

## 2023-11-13 LAB — GENECONNECT MOLECULAR SCREEN: Genetic Analysis Overall Interpretation: NEGATIVE

## 2023-12-19 ENCOUNTER — Encounter: Payer: Self-pay | Admitting: Gastroenterology

## 2023-12-26 ENCOUNTER — Encounter: Payer: Self-pay | Admitting: Family Medicine

## 2023-12-31 ENCOUNTER — Other Ambulatory Visit: Payer: Self-pay | Admitting: Family Medicine

## 2024-01-06 ENCOUNTER — Telehealth: Payer: Self-pay

## 2024-01-06 NOTE — Telephone Encounter (Signed)
 Copied from CRM [id number]. Topic: Clinical - Medication Question >> Jan 06, 2024 12:18 PM Allyne Areola wrote: Reason for CRM: Patient is calling to follow up on a refill request he submitted through MyChart for his amphetamine -dextroamphetamine  (ADDERALL) 10 MG tablet. He followed up with the pharmacy and they haven't received the prescription. He is completely out of the medication.

## 2024-01-07 MED ORDER — AMPHETAMINE-DEXTROAMPHETAMINE 10 MG PO TABS: 10.0000 mg | ORAL_TABLET | Freq: Two times a day (BID) | ORAL | 0 refills | Status: DC

## 2024-01-07 MED ORDER — LOSARTAN POTASSIUM 50 MG PO TABS: 50.0000 mg | ORAL_TABLET | Freq: Every day | ORAL | 2 refills | Status: DC

## 2024-01-07 NOTE — Telephone Encounter (Signed)
 We will add Losartan  50 mg daily. I sent this in, report back to us  in 4 weeks

## 2024-01-07 NOTE — Telephone Encounter (Signed)
 Done

## 2024-01-09 NOTE — Telephone Encounter (Signed)
 Rx filled on 01/07/24

## 2024-01-15 ENCOUNTER — Other Ambulatory Visit: Payer: Self-pay | Admitting: Family Medicine

## 2024-01-17 ENCOUNTER — Ambulatory Visit (HOSPITAL_BASED_OUTPATIENT_CLINIC_OR_DEPARTMENT_OTHER)

## 2024-01-17 ENCOUNTER — Ambulatory Visit (HOSPITAL_BASED_OUTPATIENT_CLINIC_OR_DEPARTMENT_OTHER): Admitting: Orthopaedic Surgery

## 2024-01-17 DIAGNOSIS — G8929 Other chronic pain: Secondary | ICD-10-CM | POA: Diagnosis not present

## 2024-01-17 DIAGNOSIS — M25511 Pain in right shoulder: Secondary | ICD-10-CM | POA: Diagnosis not present

## 2024-01-17 DIAGNOSIS — M19011 Primary osteoarthritis, right shoulder: Secondary | ICD-10-CM | POA: Diagnosis not present

## 2024-01-17 NOTE — Progress Notes (Signed)
 Chief Complaint: Right shoulder pain     History of Present Illness:    Jordan Robles is a 43 y.o. male right-hand-dominant male with persistent right shoulder pain since 2017 in 2018.  This was his first dislocation.  Since that time he has had multiple instability episodes despite physical therapy for which he is continue to work on strengthening exercises.  He works in Consulting civil engineer remotely.  He states that he is having a hard time staying active or doing exercises as he feels apprehension anytime he goes overhead.  He is having a hard time laying directly on the side.    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 Savannah Curlin, 13 adenomatous polyps and 2 hyperplastic polyps. repeat in one yr   ESOPHAGOGASTRODUODENOSCOPY  07/18/2022   per Dr. Savannah Curlin, reflux esophagitis, neg for H pylori. 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   VASECTOMY     Social History   Socioeconomic History   Marital status: Married    Spouse name: Not on file   Number of children: Not on file   Years of education: Not on file   Highest education level: Master's degree (e.g., MA, MS, MEng, MEd, MSW, MBA)  Occupational History   Not on file  Tobacco Use   Smoking status: Former    Current packs/day: 0.00    Types: Cigarettes    Quit date: 07/18/2017    Years since quitting: 6.5   Smokeless tobacco: Never   Tobacco comments:    5 CIGARETTES DAILY   Vaping Use   Vaping status: Never Used  Substance and Sexual Activity   Alcohol use: Yes    Alcohol/week: 4.0 standard drinks of alcohol    Types: 4 Cans of beer per week    Drug use: No   Sexual activity: Not on file  Other Topics Concern   Not on file  Social History Narrative   Not on file   Social Drivers of Health   Financial Resource Strain: Medium Risk (10/14/2023)   Overall Financial Resource Strain (CARDIA)    Difficulty of Paying Living Expenses: Somewhat hard  Food Insecurity: No Food Insecurity (10/14/2023)   Hunger Vital Sign    Worried About Running Out of Food in the Last Year: Never true    Ran Out of Food in the Last Year: Never true  Transportation Needs: No Transportation Needs (10/14/2023)   PRAPARE - Administrator, Civil Service (Medical): No    Lack of Transportation (Non-Medical): No  Physical Activity: Insufficiently Active (10/14/2023)   Exercise Vital Sign    Days of Exercise per Week: 4 days    Minutes of Exercise per Session: 20 min  Stress: Stress Concern Present (10/14/2023)   [employer] of Occupational Health - Occupational Stress Questionnaire    Feeling of Stress : To some extent  Social Connections: Moderately Integrated (10/14/2023)   Social Connection and Isolation Panel [NHANES]    Frequency of Communication with Friends and Family: More than three times a week    Frequency of Social Gatherings with Friends and Family: Three  times a week    Attends Religious Services: Never    Active Member of Clubs or Organizations: Yes    Attends Banker Meetings: 1 to 4 times per year    Marital Status: Married   Family History  Problem Relation Age of Onset   Cancer Sister 8       rare tumor on her chest   Lung cancer Maternal Grandfather        smoked   Ovarian cancer Paternal Grandmother    Hyperlipidemia Other    Hypertension Other    Breast cancer Cousin        paternal first cousin   Leukemia Half-Brother 7       paternal half-brother   Colon cancer Neg Hx    Rectal cancer Neg Hx    Stomach cancer Neg Hx    Esophageal cancer Neg Hx    Allergies  Allergen Reactions   Doxycycline  Hives    hives   Current Outpatient Medications  Medication Sig Dispense Refill   ALPRAZolam  (XANAX ) 0.5 MG tablet TAKE 1 TABLET(0.5 MG) BY MOUTH TWICE DAILY AS NEEDED FOR ANXIETY 60 tablet 5   AMBULATORY NON FORMULARY MEDICATION Medication Name: Digestive enzymes Probiotics, 3 gummies daily     amphetamine -dextroamphetamine  (ADDERALL) 10 MG tablet Take 1 tablet (10 mg total) by mouth 2 (two) times daily. 60 tablet 0   amphetamine -dextroamphetamine  (ADDERALL) 10 MG tablet Take 1 tablet (10 mg total) by mouth 2 (two) times daily. 60 tablet 0   amphetamine -dextroamphetamine  (ADDERALL) 10 MG tablet Take 1 tablet (10 mg total) by mouth 2 (two) times daily. 60 tablet 0   atorvastatin  (LIPITOR) 40 MG tablet TAKE 1 TABLET BY MOUTH DAILY 90 tablet 1   fenofibrate  160 MG tablet TAKE 1 TABLET(160 MG) BY MOUTH DAILY 90 tablet 3   fluticasone  (FLONASE ) 50 MCG/ACT nasal spray SHAKE LIQUID AND USE 2 SPRAYS IN EACH NOSTRIL DAILY 16 g 11   levocetirizine (XYZAL ) 5 MG tablet TAKE 1 TABLET(5 MG) BY MOUTH EVERY EVENING 30 tablet 11   losartan  (COZAAR ) 50 MG tablet Take 1 tablet (50 mg total) by mouth daily. 30 tablet 2   MAGNESIUM PO Take 250 mg by mouth daily at 12 noon.     metoprolol  succinate (TOPROL -XL) 100 MG 24 hr tablet Take 1 tablet (100 mg total) by mouth daily. Take with or immediately following a meal. 90 tablet 3   niacin  (NIASPAN ) 1000 MG CR tablet TAKE 1 TABLET(1000 MG) BY MOUTH AT BEDTIME 90 tablet 3   Omega-3 Fatty Acids (FISH OIL PO) Take by mouth daily.     pantoprazole  (PROTONIX ) 20 MG tablet Use Protonix  20 mg by mouth twice a day for 90 days #90 with 3 refills 90 tablet 3   sertraline  (ZOLOFT ) 50 MG tablet TAKE 1 TABLET(50 MG) BY MOUTH DAILY 90 tablet 3   temazepam  (RESTORIL ) 30 MG capsule TAKE 1 CAPSULE(30 MG) BY MOUTH AT BEDTIME 30 capsule 5   No current facility-administered medications for this visit.   No results found.  Review of Systems:   A ROS was performed including pertinent  positives and negatives as documented in the HPI.  Physical Exam :   Constitutional: NAD and appears stated age Neurological: Alert and oriented Psych: Appropriate affect and cooperative There were no vitals taken for this visit.   Comprehensive Musculoskeletal Exam:    Tenderness about the glenohumeral joint.  Positive apprehension with overhead motion.  This was improved with a posterior  directed force.  Active forward elevation is to 160 degrees although with apprehension just in forward elevation.  External rotation at the side is 45.  Internal rotation is to L1   Imaging:   Xray (3 views right shoulder): Anterior glenoid defect with large Hill-Sachs   I personally reviewed and interpreted the radiographs.   Assessment and Plan:   43 y.o. male with a known labral tear on MRI.  Unfortunately he has had multiple recurring instances of instability as well as several dislocations.  He is experiencing these at least once weekly.  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  I do believe he may benefit from a soft tissue only repair although I would like to calculate the amount of bipolar bone loss present  -Plan for MRI right shoulder and follow-up to discuss results   I personally saw and evaluated the patient, and participated in the management and treatment plan.  Wilhelmenia Harada, MD Attending Physician, Orthopedic Surgery  This document was dictated using Dragon voice recognition software. A reasonable attempt at proof reading has been made to minimize errors.

## 2024-01-27 ENCOUNTER — Ambulatory Visit
Admission: RE | Admit: 2024-01-27 | Discharge: 2024-01-27 | Disposition: A | Discharge status: Home / Self Care | Source: Ambulatory Visit | Attending: Orthopaedic Surgery | Admitting: Orthopaedic Surgery

## 2024-01-27 DIAGNOSIS — G8929 Other chronic pain: Secondary | ICD-10-CM

## 2024-02-12 ENCOUNTER — Ambulatory Visit (HOSPITAL_BASED_OUTPATIENT_CLINIC_OR_DEPARTMENT_OTHER): Admitting: Orthopaedic Surgery

## 2024-02-12 ENCOUNTER — Ambulatory Visit (HOSPITAL_BASED_OUTPATIENT_CLINIC_OR_DEPARTMENT_OTHER): Payer: Self-pay | Admitting: Orthopaedic Surgery

## 2024-02-12 ENCOUNTER — Telehealth (HOSPITAL_BASED_OUTPATIENT_CLINIC_OR_DEPARTMENT_OTHER): Payer: Self-pay | Admitting: Orthopaedic Surgery

## 2024-02-12 ENCOUNTER — Other Ambulatory Visit (HOSPITAL_BASED_OUTPATIENT_CLINIC_OR_DEPARTMENT_OTHER): Payer: Self-pay

## 2024-02-12 DIAGNOSIS — G8929 Other chronic pain: Secondary | ICD-10-CM

## 2024-02-12 DIAGNOSIS — M25511 Pain in right shoulder: Secondary | ICD-10-CM

## 2024-02-12 MED ORDER — OXYCODONE HCL 5 MG PO TABS
5.0000 mg | ORAL_TABLET | ORAL | 0 refills | Status: DC | PRN
  Filled 2024-02-12: qty 10, 2d supply, fill #0

## 2024-02-12 MED ORDER — ACETAMINOPHEN 500 MG PO TABS
500.0000 mg | ORAL_TABLET | Freq: Three times a day (TID) | ORAL | 0 refills | Status: AC
  Filled 2024-02-12: qty 30, 10d supply, fill #0

## 2024-02-12 MED ORDER — ASPIRIN 325 MG PO TBEC
325.0000 mg | DELAYED_RELEASE_TABLET | Freq: Every day | ORAL | 0 refills | Status: DC
  Filled 2024-02-12: qty 14, 14d supply, fill #0

## 2024-02-12 MED ORDER — IBUPROFEN 800 MG PO TABS
800.0000 mg | ORAL_TABLET | Freq: Three times a day (TID) | ORAL | 0 refills | Status: AC
  Filled 2024-02-12: qty 30, 10d supply, fill #0

## 2024-02-12 NOTE — Progress Notes (Signed)
 Chief Complaint: Right shoulder pain     History of Present Illness:   02/12/2024: Presents today for MRI discussion of the right shoulder.  Jordan Robles is a 43 y.o. male right-hand-dominant male with persistent right shoulder pain since 2017 in 2018.  This was his first dislocation.  Since that time he has had multiple instability episodes despite physical therapy for which he is continue to work on strengthening exercises.  He works in Consulting civil engineer remotely.  He states that he is having a hard time staying active or doing exercises as he feels apprehension anytime he goes overhead.  He is having a hard time laying directly on the side.    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 Savannah Curlin, 13 adenomatous polyps and 2 hyperplastic polyps. repeat in one yr   ESOPHAGOGASTRODUODENOSCOPY  07/18/2022   per Dr. Savannah Curlin, reflux esophagitis, neg for H pylori. 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   VASECTOMY     Social History   Socioeconomic History   Marital status: Married    Spouse name: Not on file   Number of children: Not on file   Years of education: Not on file   Highest education level: Master's degree (e.g., MA, MS, MEng, MEd, MSW, MBA)  Occupational History   Not on file  Tobacco Use   Smoking status: Former    Current packs/day: 0.00    Types: Cigarettes    Quit date: 07/18/2017    Years since quitting: 6.5   Smokeless tobacco: Never   Tobacco comments:    5 CIGARETTES DAILY   Vaping Use   Vaping status: Never Used  Substance and Sexual Activity   Alcohol use: Yes    Alcohol/week:  4.0 standard drinks of alcohol    Types: 4 Cans of beer per week   Drug use: No   Sexual activity: Not on file  Other Topics Concern   Not on file  Social History Narrative   Not on file   Social Drivers of Health   Financial Resource Strain: Medium Risk (10/14/2023)   Overall Financial Resource Strain (CARDIA)    Difficulty of Paying Living Expenses: Somewhat hard  Food Insecurity: No Food Insecurity (10/14/2023)   Hunger Vital Sign    Worried About Running Out of Food in the Last Year: Never true    Ran Out of Food in the Last Year: Never true  Transportation Needs: No Transportation Needs (10/14/2023)   PRAPARE - Administrator, Civil Service (Medical): No    Lack of Transportation (Non-Medical): No  Physical Activity: Insufficiently Active (10/14/2023)   Exercise Vital Sign    Days of Exercise per Week: 4 days    Minutes of Exercise per Session: 20 min  Stress: Stress Concern Present (10/14/2023)   [employer] of Occupational Health - Occupational Stress Questionnaire    Feeling of Stress : To some extent  Social Connections: Moderately Integrated (10/14/2023)   Social Connection and Isolation Panel [NHANES]    Frequency of Communication with Friends and Family: More than three times a week  Frequency of Social Gatherings with Friends and Family: Three times a week    Attends Religious Services: Never    Active Member of Clubs or Organizations: Yes    Attends Banker Meetings: 1 to 4 times per year    Marital Status: Married   Family History  Problem Relation Age of Onset   Cancer Sister 8       rare tumor on her chest   Lung cancer Maternal Grandfather        smoked   Ovarian cancer Paternal Grandmother    Hyperlipidemia Other    Hypertension Other    Breast cancer Cousin        paternal first cousin   Leukemia Half-Brother 7       paternal half-brother   Colon cancer Neg Hx    Rectal cancer Neg Hx    Stomach cancer Neg Hx     Esophageal cancer Neg Hx    Allergies  Allergen Reactions   Doxycycline Hives    hives   Current Outpatient Medications  Medication Sig Dispense Refill   acetaminophen (TYLENOL) 500 MG tablet Take 1 tablet (500 mg total) by mouth every 8 (eight) hours for 10 days. 30 tablet 0   aspirin EC 325 MG tablet Take 1 tablet (325 mg total) by mouth daily. 14 tablet 0   ibuprofen (ADVIL) 800 MG tablet Take 1 tablet (800 mg total) by mouth every 8 (eight) hours for 10 days. Please take with food, please alternate with acetaminophen 30 tablet 0   oxyCODONE (ROXICODONE) 5 MG immediate release tablet Take 1 tablet (5 mg total) by mouth every 4 (four) hours as needed for severe pain (pain score 7-10) or breakthrough pain. 10 tablet 0   ALPRAZolam  (XANAX ) 0.5 MG tablet TAKE 1 TABLET(0.5 MG) BY MOUTH TWICE DAILY AS NEEDED FOR ANXIETY 60 tablet 5   AMBULATORY NON FORMULARY MEDICATION Medication Name: Digestive enzymes Probiotics, 3 gummies daily     amphetamine -dextroamphetamine  (ADDERALL) 10 MG tablet Take 1 tablet (10 mg total) by mouth 2 (two) times daily. 60 tablet 0   amphetamine -dextroamphetamine  (ADDERALL) 10 MG tablet Take 1 tablet (10 mg total) by mouth 2 (two) times daily. 60 tablet 0   amphetamine -dextroamphetamine  (ADDERALL) 10 MG tablet Take 1 tablet (10 mg total) by mouth 2 (two) times daily. 60 tablet 0   atorvastatin  (LIPITOR) 40 MG tablet TAKE 1 TABLET BY MOUTH DAILY 90 tablet 1   fenofibrate  160 MG tablet TAKE 1 TABLET(160 MG) BY MOUTH DAILY 90 tablet 3   fluticasone  (FLONASE ) 50 MCG/ACT nasal spray SHAKE LIQUID AND USE 2 SPRAYS IN EACH NOSTRIL DAILY 16 g 11   levocetirizine (XYZAL ) 5 MG tablet TAKE 1 TABLET(5 MG) BY MOUTH EVERY EVENING 30 tablet 11   losartan  (COZAAR ) 50 MG tablet Take 1 tablet (50 mg total) by mouth daily. 30 tablet 2   MAGNESIUM PO Take 250 mg by mouth daily at 12 noon.     metoprolol  succinate (TOPROL -XL) 100 MG 24 hr tablet Take 1 tablet (100 mg total) by mouth daily.  Take with or immediately following a meal. 90 tablet 3   niacin  (NIASPAN ) 1000 MG CR tablet TAKE 1 TABLET(1000 MG) BY MOUTH AT BEDTIME 90 tablet 3   Omega-3 Fatty Acids (FISH OIL PO) Take by mouth daily.     pantoprazole  (PROTONIX ) 20 MG tablet Use Protonix  20 mg by mouth twice a day for 90 days #90 with 3 refills 90 tablet 3   sertraline  (  ZOLOFT ) 50 MG tablet TAKE 1 TABLET(50 MG) BY MOUTH DAILY 90 tablet 3   temazepam  (RESTORIL ) 30 MG capsule TAKE 1 CAPSULE(30 MG) BY MOUTH AT BEDTIME 30 capsule 5   No current facility-administered medications for this visit.   No results found.  Review of Systems:   A ROS was performed including pertinent positives and negatives as documented in the HPI.  Physical Exam :   Constitutional: NAD and appears stated age Neurological: Alert and oriented Psych: Appropriate affect and cooperative There were no vitals taken for this visit.   Comprehensive Musculoskeletal Exam:    Tenderness about the glenohumeral joint.  Positive apprehension with overhead motion.  This was improved with a posterior directed force.  Active forward elevation is to 160 degrees although with apprehension just in forward elevation.  External rotation at the side is 45.  Internal rotation is to L1   Imaging:   Xray (3 views right shoulder): Anterior glenoid defect with large Hill-Sachs  MRI right shoulder: Anterior-inferior labral tear without significant glenoid bone loss certainly less than 10%  I personally reviewed and interpreted the radiographs.   Assessment and Plan:   43 y.o. male with a known labral tear on MRI.  Unfortunately he has had multiple recurring instances of instability as well as several dislocations.  He is experiencing these at least once weekly.  At this time he does have less than 10% glenoid bone loss and as result I did discuss the possibility of arthroscopic labral repair.  I discussed the risks and limitations as well as associated recovery.  After  discussion he would like to proceed with this  - Plan for right shoulder arthroscopy with inferior labral repair   After a lengthy discussion of treatment options, including risks, benefits, alternatives, complications of surgical and nonsurgical conservative options, the patient elected surgical repair.   The patient  is aware of the material risks  and complications including, but not limited to injury to adjacent structures, neurovascular injury, infection, numbness, bleeding, implant failure, thermal burns, stiffness, persistent pain, failure to heal, disease transmission from allograft, need for further surgery, dislocation, anesthetic risks, blood clots, risks of death,and others. The probabilities of surgical success and failure discussed with patient given their particular co-morbidities.The time and nature of expected rehabilitation and recovery was discussed.The patient's questions were all answered preoperatively.  No barriers to understanding were noted. I explained the natural history of the disease process and Rx rationale.  I explained to the patient what I considered to be reasonable expectations given their personal situation.  The final treatment plan was arrived at through a shared patient decision making process model.    I personally saw and evaluated the patient, and participated in the management and treatment plan.  Wilhelmenia Harada, MD Attending Physician, Orthopedic Surgery  This document was dictated using Dragon voice recognition software. A reasonable attempt at proof reading has been made to minimize errors.

## 2024-02-12 NOTE — Telephone Encounter (Signed)
Pre-op information ?

## 2024-02-27 ENCOUNTER — Encounter (HOSPITAL_BASED_OUTPATIENT_CLINIC_OR_DEPARTMENT_OTHER): Payer: Self-pay | Admitting: Orthopaedic Surgery

## 2024-02-27 DIAGNOSIS — S43431A Superior glenoid labrum lesion of right shoulder, initial encounter: Secondary | ICD-10-CM | POA: Diagnosis not present

## 2024-02-27 DIAGNOSIS — M25311 Other instability, right shoulder: Secondary | ICD-10-CM | POA: Diagnosis not present

## 2024-02-27 DIAGNOSIS — M25511 Pain in right shoulder: Secondary | ICD-10-CM | POA: Diagnosis not present

## 2024-02-27 DIAGNOSIS — G8929 Other chronic pain: Secondary | ICD-10-CM | POA: Diagnosis not present

## 2024-02-27 DIAGNOSIS — G8918 Other acute postprocedural pain: Secondary | ICD-10-CM | POA: Diagnosis not present

## 2024-02-27 NOTE — Progress Notes (Signed)
 Date of Surgery: 02/27/2024  INDICATIONS: Jordan Robles is a 43 y.o.-year-old male with right shoulder inferior labral tear.  The risk and benefits of the procedure were discussed in detail and documented in the pre-operative evaluation.   PREOPERATIVE DIAGNOSIS: 1. Right shoulder instability, anterior/posterior  POSTOPERATIVE DIAGNOSIS: Same.  PROCEDURE: 1. Right shoulder inferior labral repair  SURGEON: Jordan Chris MD  ASSISTANT: Deon Flatter, ATC  ANESTHESIA:  general  IV FLUIDS AND URINE: See anesthesia record.  ANTIBIOTICS: Ancef  ESTIMATED BLOOD LOSS: 10 mL.  IMPLANTS:  * No surgical log found *  DRAINS: None  CULTURES: None  COMPLICATIONS: none  DESCRIPTION OF PROCEDURE:   Examination under anesthesia revealed forward elevation of 165 degrees.  In abduction, there was 90 degrees of external rotation and 70 degrees of internal rotation.  With the arm at the side, there was 70 degrees of external rotation.  There is a 2+ anterior load shift and a 2+ posterior load shift with palpable click as the humerus moved over the glenoid.  Arthroscopic findings demonstrated:  Glenoid cartilage: Normal Humeral head: Normal Labrum:  labral tear from 3 o'clock to 9 o'clock Biceps insertion: Intact Biceps tendon: Intact Subscapularis insertion: Normal Rotator cuff: Normal  The patient was identified in the preoperative holding area.  The correct site was marked according to universal protocol.  Anesthesia performed an interscalene nerve block.  Ancef was given 1 hour prior to skin incision.  The patient was subsequently taken back to the operating room.  The patient was prepped and draped and positioned in the lateral position.  All bony prominences were padded.  Final timeout was performed.  Standard posterior, anterior and anterosuperolateral portals were utilized. The posterior portal was created with an 11-blade and the arthroscope introduced into the glenohumeral joint.   A full diagnostic arthroscopy was performed as described above.  A low anterior portal just above the rolled border of the subscapularis was identified with a spinal needle, and then instrumenting cannula was placed.  An anterosuperolateral viewing portal was localized with a spinal needle just posterior to the biceps tendon, and the arthroscope was transferred to this portal.  A posterior portal was also placed for instrumentation.  First, I directed my attention to preparation of the glenoid, labrum and capsule for repair.  The elevator was used to elevate off the injured labrum from the glenoid rim.  A shaver was subsequently introduced and used on forward in order to create bleeding bony bed for the labrum to heal back to.  Debridement was performed of the posterior and inferior labrum with combination of electrocautery and shaver.  Next, I sequentially repaired the capsule and labrum from the 90:00 position to the 3:00 position with a total of 5 anchors.  These were all suture knotless anchors as noted above.  Anchors were placed at the 3:30, 4:30, 6:00, 7, 8:30 positions.  At each location, a pilot hole was drilled, the anchor inserted and deployed.  Then, one limb of suture was shuttled around the labrum and capsule using a suture lasso, taking care to provide both medial to lateral and inferior to superior shift of the tissues.  This was subsequently fed into the knotless mechanism and tensioned.  This was done sequentially for all additional anchors.  Once completed, the labrum was restored to an anatomic position, and tension was restored to both bands of the IGHL.  The inferior capsular volume was normalized and the humeral head was centered on the glenoid.  All instruments were removed, fluid was evacuated, and the arthroscopy portals were closed with 3-0 nylon.  A sterile dressing was applied with Xeroform, gauze, ABD and Medipore tape followed by a Iceman device and a sling with an abduction  pillow.    The patient awoke from anesthesia without difficulty and was transferred to PACU in stable condition.      POSTOPERATIVE PLAN: He will follow the labral repair protocol. He will be placed on 325 mg Aspirin  for DVT prophylaxis. He will be seen in 2 weeks for suture removal.  Jordan Chris, MD 5:18 PM

## 2024-02-28 ENCOUNTER — Telehealth: Payer: Self-pay

## 2024-02-28 ENCOUNTER — Other Ambulatory Visit (HOSPITAL_BASED_OUTPATIENT_CLINIC_OR_DEPARTMENT_OTHER): Payer: Self-pay | Admitting: Orthopaedic Surgery

## 2024-02-28 MED ORDER — HYDROCODONE-ACETAMINOPHEN 5-325 MG PO TABS: 1.0000 | ORAL_TABLET | Freq: Four times a day (QID) | ORAL | 0 refills | Status: DC | PRN

## 2024-02-28 NOTE — Telephone Encounter (Signed)
 LMOM stating hydrocodone was sent to [facility] on [address]

## 2024-02-28 NOTE — Telephone Encounter (Signed)
 Patient asking for more pain medication. He is advising that the 5mg  is not working and would like to have more medication due to the weekend coming up. Please call [phone]

## 2024-03-04 ENCOUNTER — Encounter: Payer: Self-pay | Admitting: Family Medicine

## 2024-03-04 ENCOUNTER — Encounter: Payer: Self-pay | Admitting: Physical Therapy

## 2024-03-04 ENCOUNTER — Ambulatory Visit: Admitting: Physical Therapy

## 2024-03-04 DIAGNOSIS — M25611 Stiffness of right shoulder, not elsewhere classified: Secondary | ICD-10-CM

## 2024-03-04 DIAGNOSIS — R293 Abnormal posture: Secondary | ICD-10-CM

## 2024-03-04 DIAGNOSIS — R600 Localized edema: Secondary | ICD-10-CM | POA: Diagnosis not present

## 2024-03-04 DIAGNOSIS — M6281 Muscle weakness (generalized): Secondary | ICD-10-CM | POA: Diagnosis not present

## 2024-03-04 DIAGNOSIS — M25511 Pain in right shoulder: Secondary | ICD-10-CM

## 2024-03-04 MED ORDER — ATORVASTATIN CALCIUM 40 MG PO TABS: 40.0000 mg | ORAL_TABLET | Freq: Every day | ORAL | 3 refills | Status: AC

## 2024-03-04 MED ORDER — NIACIN ER (ANTIHYPERLIPIDEMIC) 1000 MG PO TBCR: 1000.0000 mg | EXTENDED_RELEASE_TABLET | Freq: Every day | ORAL | 3 refills | Status: DC

## 2024-03-04 NOTE — Telephone Encounter (Unsigned)
 Copied from CRM [id number]. Topic: Clinical - Medication Refill >> Mar 04, 2024 11:14 AM Freya Jesus wrote: Medication: niacin  (NIASPAN ) 1000 MG CR tablet [[id number]] atorvastatin  (LIPITOR) 40 MG tablet [[id number]] amphetamine -dextroamphetamine  (ADDERALL) 10 MG tablet [[id number]]  Has the patient contacted their pharmacy? No (Agent: If no, request that the patient contact the pharmacy for the refill. If patient does not wish to contact the pharmacy document the reason why and proceed with request.) (Agent: If yes, when and what did the pharmacy advise?)  This is the patient's preferred pharmacy:  [id number] - [place], [place] - [address] AT [facility] OF [address] [place] [address] Phone: [phone] Fax: [phone]  Is this the correct pharmacy for this prescription? Yes If no, delete pharmacy and type the correct one.   Has the prescription been filled recently? Yes  Is the patient out of the medication? No  Has the patient been seen for an appointment in the last year OR does the patient have an upcoming appointment? Yes  Can we respond through MyChart? Yes  Agent: Please be advised that Rx refills may take up to 3 business days. We ask that you follow-up with your pharmacy.

## 2024-03-04 NOTE — Telephone Encounter (Signed)
 I sent in Lipitor and Niaspan . He has enough Adderall until July 22

## 2024-03-04 NOTE — Therapy (Signed)
 OUTPATIENT PHYSICAL THERAPY SHOULDER EVALUATION   Patient Name: Jordan Robles MRN: [id number] DOB:Sep 19, 1980, 43 y.o., male Today's Date: 03/04/2024  END OF SESSION:  PT End of Session - 03/04/24 1644     Visit Number 1    Number of Visits 25    Date for PT Re-Evaluation 05/29/24    Authorization Type BCBS PPO    Authorization Time Period NO AUTH NEEDED  DED AND OOP NOT MET, PAY TOWARDS OOP, NO COPAY    PT Start Time 1305    PT Stop Time 1347    PT Time Calculation (min) 42 min    Activity Tolerance Patient tolerated treatment well;Patient limited by pain    Behavior During Therapy WFL for tasks assessed/performed          Past Medical History:  Diagnosis Date   ADHD (attention deficit hyperactivity disorder)    Genital warts    GERD (gastroesophageal reflux disease)    Hyperlipidemia    Hypertension    Learning disability    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 Savannah Curlin, 13 adenomatous polyps and 2 hyperplastic polyps. repeat in one yr   ESOPHAGOGASTRODUODENOSCOPY  07/18/2022   per Dr. Savannah Curlin, reflux esophagitis, neg for H pylori. 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   HYPERLIPIDEMIA 05/15/2007   LEARNING DISABILITY 05/15/2007    PCP: Donley Furth, MD  REFERRING PROVIDER: Wilhelmenia Harada, MD  REFERRING DIAG: [phone]  (ICD-10-CM) - Chronic right shoulder pain   THERAPY DIAG:  Muscle weakness (generalized)  Acute pain of right shoulder  Stiffness of right shoulder, not elsewhere classified  Localized edema  Abnormal posture  Rationale for Evaluation and Treatment: Rehabilitation  ONSET DATE: 02/27/2024 right shoulder labral sg repair  SUBJECTIVE:                                                                                                                                                                                      SUBJECTIVE STATEMENT: This 43yo male has persistent right shoulder pain since 2017 with a  dislocation. He had some high school football dislocations. He had frequent dislocations with worst in April 2025 and multiple episodes of instability despite PT for strengthening. He had right shoulder instability with inferior labral tear with repair on 02/27/2024.  Hand dominance: Right  PERTINENT HISTORY: ADHD, GERD, HTN, HLD, testicular CA  DIAGNOSTIC FINDINGS:  01/27/2024 MRI Findings Bones: Mild AC joint arthrosis. Minimal joint is unremarkable with no fracture or contusion pattern. Mild subchondral cyst are seen in the posterior lateral humeral head likely related to insertional tendinosis of the adjacent supraspinatus infraspinatus tendons. No subacromial or subdeltoid bursal collection is present.   Rotator cuff: There is mild insertional tendinosis of the supraspinatus tendon with mild bursal surface fraying. There is no significant partial or full thickness tear. Subscapularis and teres minor tendons are unremarkable.   Labrum and biceps tendon: Biceps tendon is intact. There is a nondisplaced posterior labral tear best seen on axial image 12 through 15 of series 3. There is a questionable chronic nondisplaced anterior labral tear with slight truncation of the anterior glenoid. Correlation for remote dislocation.  PAIN:  Are you having pain? Yes: NPRS scale: at rest  3-5/10, unintentionally moving like adjusting sling 6-7/10 Pain location: right shoulder anterior Pain description:  burning & tingling at rest, stabbing & sharp if moves it Aggravating factors: unintentionally moving arm Relieving factors: prescription - ibuprofen , tylenol  and ice  PRECAUTIONS: Shoulder see protocol in file box  RED FLAGS: None   WEIGHT BEARING RESTRICTIONS: unable to use right arm per protocol   FALLS:  Has patient fallen in last 6 months? Yes. 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   NEXT MD VISIT: 03/12/2024  OBJECTIVE:  Note: Objective measures were completed at Evaluation unless otherwise noted.  Patient-Specific Activity Scoring Scheme  0 represents "unable to perform." 10 represents "able to perform at prior level. 0 1 2 3 4 5 6 7 8 9  10 (Date and Score)   Activity Eval     1.  Computer work  0    2. Playing drums 0     3. Handy man tasks 0   4.    5.    Score 0    Total score = sum of the activity scores/number of activities Minimum detectable change (90%CI) for average score = 2 points Minimum detectable change (90%CI) for single activity score = 3 points  COGNITION: Overall cognitive status: Within functional limits for tasks assessed     SENSATION: WFL  POSTURE: Head forward, rounded shoulder,  UPPER EXTREMITY ROM:   ROM Right eval  Shoulder flexion Supine P: 52*  Shoulder extension   Shoulder abduction Supine P: 41*  Shoulder adduction   Shoulder internal rotation Supine To stomach Limited by  protocol  Shoulder external rotation Supine 20*limited by protocol  Elbow flexion   Elbow extension -5*  Wrist flexion   Wrist extension   Wrist ulnar deviation   Wrist radial deviation   Wrist pronation   Wrist supination   (Blank rows = not tested)  UPPER EXTREMITY MMT:  MMT Right Eval Not  tested due to protocol  Shoulder flexion   Shoulder extension   Shoulder abduction   Shoulder adduction   Shoulder internal rotation   Shoulder external rotation   Middle trapezius   Lower trapezius   Elbow flexion   Elbow extension   Wrist flexion   Wrist extension   Wrist ulnar deviation   Wrist radial deviation   Wrist pronation   Wrist supination   Grip strength (lbs)   (Blank rows = not tested)  PALPATION:  03/04/2024: Pain with palpation to upper, middle & inferior trapezius muscles, pectoralis muscle, deltoid muscle, biceps muscle and ribs 4-8 under axilla.   EDEMA: Localized edema to right shoulder, scapula and axilla.    TODAY'S TREATMENT:                                                                                                       DATE: 03/04/2024 Therapeutic Exercise: HEP instruction/performance c cues for techniques, handout provided.  Trial set performed of each for comprehension and symptom assessment.  See below for exercise list  Self-care: PT suggested sling to properly support patient arm.  PT demo & verbal cues on sling use.  Manual therapy: PROM supine for flexion and abduction.   PATIENT EDUCATION: Education details: HEP, POC Person educated: Patient Education method: Programmer, multimedia, Demonstration, Verbal cues, and Handouts Education comprehension: verbalized understanding, returned demonstration, and verbal cues required  HOME EXERCISE PROGRAM: Access Code: URL: [email or web address] Date: 03/04/2024 Prepared by: Lorie Rook  Exercises - Seated Isometric Shoulder External Rotation with Towel  - 3-4 x daily - 7 x  weekly - 1 sets - 10 reps - 5 seconds hold - Seated Isometric Shoulder Internal Rotation with Towel  - 3-4 x daily - 7 x weekly - 1 sets - 10 reps - 5 seconds hold - Seated Isometric Shoulder Flexion  - 3-4 x daily - 7 x weekly - 1 sets - 10 reps - 5 seconds hold - Standing Isometric Shoulder Extension with Doorway - Arm Bent  - 3-4 x daily - 7 x weekly - 1 sets - 10 reps - 5 seconds hold - Supine Chin Tuck  - 3-4 x daily - 7 x weekly - 1 sets - 10 reps - 5 seconds holdhoulder Extension with Doorway - Arm Bent  - 3-4 x daily - 7 x weekly - 1 sets - 10 reps - 5 seconds hold  ASSESSMENT:  CLINICAL IMPRESSION: Patient is a 43 y.o. who comes to clinic with complaints of right shoulder pain s/p labral repair surgery with mobility, strength and movement coordination deficits that impair their ability to perform usual daily and recreational functional activities without increase difficulty/symptoms at this time.  Patient to benefit from skilled PT services to address impairments and limitations to improve to previous level of function without restriction secondary to condition.   OBJECTIVE IMPAIRMENTS: decreased activity tolerance, decreased endurance, decreased knowledge of condition, decreased ROM, decreased strength, increased edema, increased muscle spasms, impaired UE functional use, postural dysfunction, and pain.   ACTIVITY LIMITATIONS: carrying, lifting, sleeping, stairs, bathing, dressing, reach  over head, hygiene/grooming, and computer for work.   PARTICIPATION LIMITATIONS: meal prep, laundry, driving, occupation, and yard work  PERSONAL FACTORS: Past/current experiences and 1-2 comorbidities: see PMH are also affecting patient's functional outcome.   REHAB POTENTIAL: Good  CLINICAL DECISION MAKING: Stable/uncomplicated  EVALUATION COMPLEXITY: Low   GOALS: Goals reviewed with patient? Yes  SHORT TERM GOALS: (target date for Short term goals 03/26/2024)  1.Patient will demonstrate  independent use of home exercise program to maintain progress from in clinic treatments. Baseline: SEE OBJECTIVE DATA Goal status: INITIAL  LONG TERM GOALS: (target dates for all long term goals 05/29/2024)   1. Patient will demonstrate/report pain at worst less than or equal to 2/10 to facilitate minimal limitation in daily activity secondary to pain symptoms. Baseline: SEE OBJECTIVE DATA Goal status: INITIAL   2. Patient will demonstrate independent use of home exercise program to facilitate ability to maintain/progress functional gains from skilled physical therapy services. Baseline: SEE OBJECTIVE DATA Goal status: INITIAL   3.  Patient reports Patient-Specific Activity Score improved to >5 to indicate improvement in functional activities.  Baseline: SEE OBJECTIVE DATA Goal status: INITIAL   4.  Patient will demonstrate right UE MMT 5/5 throughout to facilitate lifting, reaching, carrying at [facility] in daily activity.  Baseline: SEE OBJECTIVE DATA Goal status: INITIAL   5.  Patient will demonstrate right GH joint AROM WFL s symptoms to facilitate usual overhead reaching, self care, dressing at PLOF.  Baseline: SEE OBJECTIVE DATA Goal status: INITIAL   6.  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  Baseline: SEE OBJECTIVE DATA Goal status: INITIAL    PLAN: PT FREQUENCY: 1x/wk for 4 weeks, then 2-3x/wk for 8 weeks  PT DURATION: 12 weeks  PLANNED INTERVENTIONS: 97164- PT Re-evaluation, 97110-Therapeutic exercises, 97530- Therapeutic activity, [id number]- Neuromuscular re-education, 97535- Self Care, 16109- Manual therapy, G0283- Electrical stimulation (unattended), [id number]- Electrical stimulation (manual), 97016- Vasopneumatic device, 20560 (1-2 muscles), 20561 (3+ muscles)- Dry Needling, Patient/Family education, Taping, Scar mobilization, Cryotherapy, and Moist heat  PLAN FOR NEXT SESSION:  Follow protocol in file box, Phase 2: 1-4 weeks (ends 7/10) with  PROM, continue isometrics as tolerated, AAROM with wand, pulleys & table slides as tolerated.     Lorie Rook, PT, DPT 03/04/2024, 5:06 PM

## 2024-03-09 ENCOUNTER — Encounter: Payer: Self-pay | Admitting: Rehabilitative and Restorative Service Providers"

## 2024-03-09 ENCOUNTER — Ambulatory Visit (INDEPENDENT_AMBULATORY_CARE_PROVIDER_SITE_OTHER): Payer: Self-pay | Admitting: Rehabilitative and Restorative Service Providers"

## 2024-03-09 DIAGNOSIS — M25611 Stiffness of right shoulder, not elsewhere classified: Secondary | ICD-10-CM

## 2024-03-09 DIAGNOSIS — R293 Abnormal posture: Secondary | ICD-10-CM

## 2024-03-09 DIAGNOSIS — R600 Localized edema: Secondary | ICD-10-CM | POA: Diagnosis not present

## 2024-03-09 DIAGNOSIS — M25511 Pain in right shoulder: Secondary | ICD-10-CM

## 2024-03-09 DIAGNOSIS — M6281 Muscle weakness (generalized): Secondary | ICD-10-CM

## 2024-03-09 NOTE — Therapy (Signed)
 OUTPATIENT PHYSICAL THERAPY TREATMENT   Patient Name: Jordan Robles MRN: [id number] DOB:10-Dec-1980, 43 y.o., male Today's Date: 03/09/2024  END OF SESSION:  PT End of Session - 03/09/24 1531     Visit Number 2    Number of Visits 25    Date for PT Re-Evaluation 05/29/24    Authorization Type BCBS PPO    Authorization Time Period NO AUTH NEEDED  DED AND OOP NOT MET, PAY TOWARDS OOP, NO COPAY    PT Start Time 1518    PT Stop Time 1545    PT Time Calculation (min) 27 min    Activity Tolerance Patient tolerated treatment well    Behavior During Therapy WFL for tasks assessed/performed           Past Medical History:  Diagnosis Date   ADHD (attention deficit hyperactivity disorder)    Genital warts    GERD (gastroesophageal reflux disease)    Hyperlipidemia    Hypertension    Learning disability    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 Eda, 13 adenomatous polyps and 2 hyperplastic polyps. repeat in one yr   ESOPHAGOGASTRODUODENOSCOPY  07/18/2022   per Dr. Eda, reflux esophagitis, neg for H pylori. 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   HYPERLIPIDEMIA 05/15/2007   LEARNING DISABILITY 05/15/2007    PCP: Johnny Garnette LABOR, MD  REFERRING PROVIDER: Genelle Standing MD  REFERRING DIAG: [phone] (ICD-10-CM) - Chronic right shoulder  pain   THERAPY DIAG:  Muscle weakness (generalized)  Acute pain of right shoulder  Stiffness of right shoulder, not elsewhere classified  Localized edema  Abnormal posture  Rationale for Evaluation and Treatment: Rehabilitation  ONSET DATE: 02/27/2024 right shoulder labral sg repair  SUBJECTIVE:                                                                                                                                                                                      SUBJECTIVE STATEMENT: Feeling no real complaints of pain to report at rest and most of the time.  Reported sleeping in bed.     Hand dominance: Right  PERTINENT HISTORY: ADHD, GERD, HTN, HLD, testicular CA  PAIN:  NPRS scale: no pain Pain location: right shoulder anterior Pain description:  burning & tingling at rest, stabbing & sharp if moves it Aggravating factors: unintentionally moving arm Relieving factors: prescription - ibuprofen , tylenol  and ice  PRECAUTIONS: Shoulder see protocol in file box  RED FLAGS: None   WEIGHT BEARING RESTRICTIONS: unable to use right arm per protocol   FALLS:  Has patient fallen in last 6 months? Yes. 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   NEXT MD VISIT: 03/12/2024  OBJECTIVE:  Note: Objective measures were completed at Evaluation unless otherwise noted.  DIAGNOSTIC FINDINGS:  01/27/2024 MRI Findings Bones: Mild AC joint arthrosis. Minimal joint is unremarkable with no fracture or contusion pattern. Mild subchondral cyst are seen  in the posterior lateral humeral head likely related to insertional tendinosis of the adjacent supraspinatus infraspinatus tendons. No subacromial or subdeltoid bursal collection is present.   Rotator cuff: There is mild insertional tendinosis of the supraspinatus tendon with mild bursal surface fraying. There is no significant partial or full thickness tear. Subscapularis and teres minor tendons are unremarkable.   Labrum and biceps tendon: Biceps tendon is intact. There is a nondisplaced posterior labral tear best seen on axial image 12 through 15 of series 3. There is a questionable chronic nondisplaced anterior labral tear with slight truncation of the anterior glenoid. Correlation for remote dislocation.  Patient-Specific Activity Scoring Scheme  0 represents "unable to perform." 10 represents "able to perform at prior level. 0 1 2 3 4 5 6 7 8 9  10 (Date and Score)   Activity Eval  03/04/2024    1.  Computer work  0    2. Playing drums 0     3. Handy man tasks 0   4.    5.    Score 0    Total score = sum of the activity scores/number of activities Minimum detectable change (90%CI) for average score = 2 points Minimum detectable change (90%CI) for single activity score = 3 points  COGNITION: 03/04/2024 Overall cognitive status: Within functional limits for tasks assessed     SENSATION: 03/04/2024 [facility]  POSTURE: 03/04/2024 Head forward, rounded shoulder,  UPPER EXTREMITY ROM:   ROM Right Eval 03/04/2024 Right 03/09/2024 PROM in supine  Shoulder flexion Supine P: 52* 90 limited by protocol  Shoulder extension    Shoulder abduction Supine P: 41* 45 limited by protocol  Shoulder adduction    Shoulder internal rotation Supine To stomach Limited by protocol   Shoulder external rotation Supine 20*limited by protocol   Elbow flexion    Elbow extension -5*   Wrist flexion    Wrist extension    Wrist ulnar deviation    Wrist radial deviation    Wrist  pronation    Wrist supination    (Blank rows = not tested)  UPPER EXTREMITY MMT:  MMT Right Eval Not tested due to protocol  Shoulder flexion   Shoulder extension   Shoulder  abduction   Shoulder adduction   Shoulder internal rotation   Shoulder external rotation   Middle trapezius   Lower trapezius   Elbow flexion   Elbow extension   Wrist flexion   Wrist extension   Wrist ulnar deviation   Wrist radial deviation   Wrist pronation   Wrist supination   Grip strength (lbs)   (Blank rows = not tested)  PALPATION:  03/04/2024: Pain with palpation to upper, middle & inferior trapezius muscles, pectoralis muscle, deltoid muscle, biceps muscle and ribs 4-8 under axilla.   EDEMA: 03/04/2024 Localized edema to right shoulder, scapula and axilla.                    TODAY'S TREATMENT:                                                                                                       DATE:  03/09/2024 Therex: Supine wand AAROM 0-90 2 x 10  Supine wand AAROM protraction hold 3 sec 2 x 15 Seated scapular retraction 3 sec hold x 10  Additional time spent in review of HEP, protocol and progressions.    Manual: Rt shoulder passive range assessment and mobility c distraction within protocol    TODAY'S TREATMENT:                                                                                                       DATE:  03/04/2024 Therapeutic Exercise: HEP instruction/performance c cues for techniques, handout provided.  Trial set performed of each for comprehension and symptom assessment.  See below for exercise list  Self-care: PT suggested sling to properly support patient arm.  PT demo & verbal cues on sling use.  Manual therapy: PROM supine for flexion and abduction.   PATIENT EDUCATION: 03/09/2024 Education details: HEP update Person educated: Patient Education method: Explanation, Demonstration, Verbal cues, and Handouts Education comprehension: verbalized  understanding, returned demonstration, and verbal cues required  HOME EXERCISE PROGRAM: Access Code: URL: [email or web address] Date: 03/09/2024 Prepared by: Ozell Silvan  Exercises - Seated Isometric Shoulder External Rotation with Towel  - 3-4 x daily - 7 x weekly - 1 sets - 10 reps - 5 seconds hold - Seated Isometric Shoulder Internal Rotation with Towel  - 3-4 x daily - 7 x weekly - 1 sets - 10 reps - 5 seconds hold - Seated Isometric Shoulder Flexion  - 3-4 x daily - 7 x weekly - 1 sets - 10 reps - 5 seconds hold - Standing Isometric Shoulder Extension with Doorway - Arm Bent  - 3-4 x daily - 7 x weekly - 1 sets - 10  reps - 5 seconds hold - Supine Chin Tuck  - 3-4 x daily - 7 x weekly - 1 sets - 10 reps - 5 seconds hold - Seated Scapular Retraction  - 3-5 x daily - 7 x weekly - 1 sets - 5 reps - 3-5 hold - Supine Shoulder Flexion with Dowel to 90  - 1-2 x daily - 7 x weekly - 1-2 sets - 10-15 reps - Supine Shoulder Protraction with Dowel  - 1-2 x daily - 7 x weekly - 1 sets - 10 reps - 3-5 hold  ASSESSMENT:  CLINICAL IMPRESSION: Pt demonstrated reduced pain and ability to perform passive/AAROM range with protocol allowed motion.  Good overall progression noted with tolerance to activity noted.  Progressed HEP to include AAROM with allowed range.   OBJECTIVE IMPAIRMENTS: decreased activity tolerance, decreased endurance, decreased knowledge of condition, decreased ROM, decreased strength, increased edema, increased muscle spasms, impaired UE functional use, postural dysfunction, and pain.   ACTIVITY LIMITATIONS: carrying, lifting, sleeping, stairs, bathing, dressing, reach over head, hygiene/grooming, and computer for work.   PARTICIPATION LIMITATIONS: meal prep, laundry, driving, occupation, and yard work  PERSONAL FACTORS: Past/current experiences and 1-2 comorbidities: see PMH are also affecting patient's functional outcome.   REHAB POTENTIAL:  Good  CLINICAL DECISION MAKING: Stable/uncomplicated  EVALUATION COMPLEXITY: Low   GOALS: Goals reviewed with patient? Yes  SHORT TERM GOALS: (target date for Short term goals 03/26/2024)  1.Patient will demonstrate independent use of home exercise program to maintain progress from in clinic treatments. Baseline: SEE OBJECTIVE DATA Goal status: INITIAL  LONG TERM GOALS: (target dates for all long term goals 05/29/2024)   1. Patient will demonstrate/report pain at worst less than or equal to 2/10 to facilitate minimal limitation in daily activity secondary to pain symptoms. Baseline: SEE OBJECTIVE DATA Goal status: INITIAL   2. Patient will demonstrate independent use of home exercise program to facilitate ability to maintain/progress functional gains from skilled physical therapy services. Baseline: SEE OBJECTIVE DATA Goal status: INITIAL   3.  Patient reports Patient-Specific Activity Score improved to >5 to indicate improvement in functional activities.  Baseline: SEE OBJECTIVE DATA Goal status: INITIAL   4.  Patient will demonstrate right UE MMT 5/5 throughout to facilitate lifting, reaching, carrying at [facility] in daily activity.  Baseline: SEE OBJECTIVE DATA Goal status: INITIAL   5.  Patient will demonstrate right GH joint AROM WFL s symptoms to facilitate usual overhead reaching, self care, dressing at PLOF.  Baseline: SEE OBJECTIVE DATA Goal status: INITIAL   6.  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  Baseline: SEE OBJECTIVE DATA Goal status: INITIAL    PLAN: PT FREQUENCY: 1x/wk for 4 weeks, then 2-3x/wk for 8 weeks  PT DURATION: 12 weeks  PLANNED INTERVENTIONS: 97164- PT Re-evaluation, 97110-Therapeutic exercises, 97530- Therapeutic activity, [id number]- Neuromuscular re-education, 97535- Self Care, 02859- Manual therapy, G0283- Electrical stimulation (unattended), [id number]- Electrical stimulation (manual), 97016- Vasopneumatic device,  20560 (1-2 muscles), 20561 (3+ muscles)- Dry Needling, Patient/Family education, Taping, Scar mobilization, Cryotherapy, and Moist heat  PLAN FOR NEXT SESSION:  Check on any updates from MD visit.   Follow protocol in file box, Phase 2: 1-4 weeks (ends 7/10)    PROM restriction 90 deg flexion, 20 ER at side, IR to stomach, 45 deg abduction, no cross arm ADD    Ozell Silvan, PT, DPT, OCS, ATC 03/09/24  3:48 PM

## 2024-03-12 ENCOUNTER — Ambulatory Visit (HOSPITAL_BASED_OUTPATIENT_CLINIC_OR_DEPARTMENT_OTHER): Admitting: Orthopaedic Surgery

## 2024-03-12 DIAGNOSIS — G8929 Other chronic pain: Secondary | ICD-10-CM

## 2024-03-12 DIAGNOSIS — M25511 Pain in right shoulder: Secondary | ICD-10-CM

## 2024-03-12 NOTE — Progress Notes (Signed)
 Post Operative Evaluation    Procedure/Date of Surgery: Right shoulder arthroscopy with labral repair 6/10  Interval History:   Presents 2 weeks after above procedure.  Overall is doing very well.  Passive range of motion is going quite nicely.  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 Eda, 13 adenomatous polyps and 2 hyperplastic polyps. repeat in one yr   ESOPHAGOGASTRODUODENOSCOPY  07/18/2022   per Dr. Eda, reflux esophagitis, neg for H pylori. 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   VASECTOMY     Social History   Socioeconomic History   Marital status: Married    Spouse name: Not on file   Number of children: Not on file   Years of education: Not on file   Highest education level: Master's degree (e.g., MA, MS, MEng, MEd, MSW, MBA)  Occupational History   Not on file  Tobacco Use   Smoking status: Former    Current packs/day: 0.00    Types: Cigarettes    Quit date: 07/18/2017    Years since quitting: 6.6   Smokeless tobacco: Never   Tobacco comments:    5 CIGARETTES DAILY   Vaping Use   Vaping status: Never Used  Substance and Sexual Activity   Alcohol use: Yes    Alcohol/week: 4.0 standard drinks of alcohol    Types: 4 Cans of beer per week   Drug use: No   Sexual activity: Not on file  Other Topics Concern   Not on file  Social History Narrative   Not on file   Social Drivers of Health   Financial Resource Strain: Medium Risk (10/14/2023)   Overall Financial Resource  Strain (CARDIA)    Difficulty of Paying Living Expenses: Somewhat hard  Food Insecurity: No Food Insecurity (10/14/2023)   Hunger Vital Sign    Worried About Running Out of Food in the Last Year: Never true    Ran Out of Food in the Last Year: Never true  Transportation Needs: No Transportation Needs (10/14/2023)   PRAPARE - Administrator, Civil Service (Medical): No    Lack of Transportation (Non-Medical): No  Physical Activity: Insufficiently Active (10/14/2023)   Exercise Vital Sign    Days of Exercise per Week: 4 days    Minutes of Exercise per Session: 20 min  Stress: Stress Concern Present (10/14/2023)   [employer] of Occupational Health - Occupational Stress Questionnaire    Feeling of Stress : To some extent  Social Connections: Moderately Integrated (10/14/2023)   Social Connection and Isolation Panel    Frequency of Communication with Friends and Family: More than three times a week    Frequency of Social Gatherings with Friends and Family: Three times a week    Attends Religious Services: Never    Active Member of Clubs or Organizations: Yes    Attends Club or  Organization Meetings: 1 to 4 times per year    Marital Status: Married   Family History  Problem Relation Age of Onset   Cancer Sister 8       rare tumor on her chest   Lung cancer Maternal Grandfather        smoked   Ovarian cancer Paternal Grandmother    Hyperlipidemia Other    Hypertension Other    Breast cancer Cousin        paternal first cousin   Leukemia Half-Brother 7       paternal half-brother   Colon cancer Neg Hx    Rectal cancer Neg Hx    Stomach cancer Neg Hx    Esophageal cancer Neg Hx    Allergies  Allergen Reactions   Doxycycline Hives    hives   Current Outpatient Medications  Medication Sig Dispense Refill   HYDROcodone -acetaminophen  (NORCO/VICODIN) 5-325 MG tablet Take 1 tablet by mouth every 6 (six) hours as needed for moderate pain (pain score 4-6). 30 tablet 0    ALPRAZolam  (XANAX ) 0.5 MG tablet TAKE 1 TABLET(0.5 MG) BY MOUTH TWICE DAILY AS NEEDED FOR ANXIETY 60 tablet 5   AMBULATORY NON FORMULARY MEDICATION Medication Name: Digestive enzymes Probiotics, 3 gummies daily     amphetamine -dextroamphetamine  (ADDERALL) 10 MG tablet Take 1 tablet (10 mg total) by mouth 2 (two) times daily. 60 tablet 0   amphetamine -dextroamphetamine  (ADDERALL) 10 MG tablet Take 1 tablet (10 mg total) by mouth 2 (two) times daily. 60 tablet 0   amphetamine -dextroamphetamine  (ADDERALL) 10 MG tablet Take 1 tablet (10 mg total) by mouth 2 (two) times daily. 60 tablet 0   aspirin  EC 325 MG tablet Take 1 tablet (325 mg total) by mouth daily. 14 tablet 0   atorvastatin  (LIPITOR) 40 MG tablet Take 1 tablet (40 mg total) by mouth daily. 90 tablet 3   fenofibrate  160 MG tablet TAKE 1 TABLET(160 MG) BY MOUTH DAILY 90 tablet 3   fluticasone  (FLONASE ) 50 MCG/ACT nasal spray SHAKE LIQUID AND USE 2 SPRAYS IN EACH NOSTRIL DAILY 16 g 11   levocetirizine (XYZAL ) 5 MG tablet TAKE 1 TABLET(5 MG) BY MOUTH EVERY EVENING 30 tablet 11   losartan  (COZAAR ) 50 MG tablet Take 1 tablet (50 mg total) by mouth daily. 30 tablet 2   MAGNESIUM PO Take 250 mg by mouth daily at 12 noon.     metoprolol  succinate (TOPROL -XL) 100 MG 24 hr tablet Take 1 tablet (100 mg total) by mouth daily. Take with or immediately following a meal. 90 tablet 3   niacin  (NIASPAN ) 1000 MG CR tablet Take 1 tablet (1,000 mg total) by mouth at bedtime. 90 tablet 3   Omega-3 Fatty Acids (FISH OIL PO) Take by mouth daily.     oxyCODONE  (ROXICODONE ) 5 MG immediate release tablet Take 1 tablet (5 mg total) by mouth every 4 (four) hours as needed for severe pain (pain score 7-10) or breakthrough pain. 10 tablet 0   pantoprazole  (PROTONIX ) 20 MG tablet Use Protonix  20 mg by mouth twice a day for 90 days #90 with 3 refills 90 tablet 3   sertraline  (ZOLOFT ) 50 MG tablet TAKE 1 TABLET(50 MG) BY MOUTH DAILY 90 tablet 3   temazepam  (RESTORIL ) 30  MG capsule TAKE 1 CAPSULE(30 MG) BY MOUTH AT BEDTIME 30 capsule 5   No current facility-administered medications for this visit.   No results found.  Review of Systems:   A ROS was performed including pertinent positives and negatives  as documented in the HPI.   Musculoskeletal Exam:    There were no vitals taken for this visit.  Right shoulder incisions are well-appearing without erythema or drainage.  In the supine position he can forward elevate to 90 degrees with external rotation of the side to 30 degrees.  Distal neurosensory exam is intact  Imaging:      I personally reviewed and interpreted the radiographs.   Assessment:   2 weeks status post right shoulder arthroscopic labral repair overall doing extremely well.  He is a candidate for the accelerated repair protocol and will discontinue his sling in 4 weeks and begin active range of motion  Plan :    - Return to clinic 4 weeks for reassessment      I personally saw and evaluated the patient, and participated in the management and treatment plan.  Elspeth Parker, MD Attending Physician, Orthopedic Surgery  This document was dictated using Dragon voice recognition software. A reasonable attempt at proof reading has been made to minimize errors.

## 2024-03-13 ENCOUNTER — Encounter (HOSPITAL_BASED_OUTPATIENT_CLINIC_OR_DEPARTMENT_OTHER): Payer: Self-pay | Admitting: Orthopaedic Surgery

## 2024-03-17 ENCOUNTER — Encounter: Payer: Self-pay | Admitting: Rehabilitative and Restorative Service Providers"

## 2024-03-23 ENCOUNTER — Encounter: Admitting: Physical Therapy

## 2024-03-23 ENCOUNTER — Encounter (HOSPITAL_BASED_OUTPATIENT_CLINIC_OR_DEPARTMENT_OTHER): Payer: Self-pay

## 2024-03-27 ENCOUNTER — Encounter: Payer: Self-pay | Admitting: Physical Therapy

## 2024-03-27 ENCOUNTER — Ambulatory Visit (INDEPENDENT_AMBULATORY_CARE_PROVIDER_SITE_OTHER): Admitting: Physical Therapy

## 2024-03-27 DIAGNOSIS — M25511 Pain in right shoulder: Secondary | ICD-10-CM | POA: Diagnosis not present

## 2024-03-27 DIAGNOSIS — R600 Localized edema: Secondary | ICD-10-CM

## 2024-03-27 DIAGNOSIS — M6281 Muscle weakness (generalized): Secondary | ICD-10-CM | POA: Diagnosis not present

## 2024-03-27 DIAGNOSIS — M25611 Stiffness of right shoulder, not elsewhere classified: Secondary | ICD-10-CM

## 2024-03-27 DIAGNOSIS — R293 Abnormal posture: Secondary | ICD-10-CM

## 2024-03-27 NOTE — Therapy (Signed)
 OUTPATIENT PHYSICAL THERAPY TREATMENT   Patient Name: KALVEN GANIM MRN: [id number] DOB:[birth year], 43 y.o., male Today's Date: 03/27/2024  END OF SESSION:  PT End of Session - 03/27/24 1604     Visit Number 3    Number of Visits 25    Date for PT Re-Evaluation 05/29/24    Authorization Type [employer] PPO    Authorization Time Period NO AUTH NEEDED  DED AND OOP NOT MET, PAY TOWARDS OOP, NO COPAY    PT Start Time 1524   pt few minutes late   PT Stop Time 1558    PT Time Calculation (min) 34 min    Activity Tolerance Patient tolerated treatment well    Behavior During Therapy WFL for tasks assessed/performed            Past Medical History:  Diagnosis Date   ADHD (attention deficit hyperactivity disorder)    Genital warts    GERD (gastroesophageal reflux disease)    Hyperlipidemia    Hypertension    Learning disability    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 Eda, 13 adenomatous polyps and 2 hyperplastic polyps. repeat in one yr   ESOPHAGOGASTRODUODENOSCOPY  07/18/2022   per Dr. Eda, reflux esophagitis, neg for H pylori. 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   HYPERLIPIDEMIA 05/15/2007   LEARNING DISABILITY 05/15/2007    PCP: Johnny Garnette LABOR, MD  REFERRING PROVIDER: Genelle Standing MD  REFERRING DIAG: [phone] (ICD-10-CM) -  Chronic right shoulder pain   THERAPY DIAG:  Muscle weakness (generalized)  Acute pain of right shoulder  Stiffness of right shoulder, not elsewhere classified  Localized edema  Abnormal posture  Rationale for Evaluation and Treatment: Rehabilitation  ONSET DATE: 02/27/2024 right shoulder labral sg repair  SUBJECTIVE:                                                                                                                                                                                      SUBJECTIVE STATEMENT:  Feeling good, shoulder is doing well and [employer]  is happy with how its looking. I still have bruising. Its a little tender to the touch still.   Hand dominance: Right  PERTINENT HISTORY: ADHD, GERD, HTN, HLD, testicular CA  PAIN:  NPRS scale: 2/10 Pain location: right shoulder anterior Pain description:  discomfort  Aggravating factors: dressing/lifting arm to dress, putting pressure on it/leaning Relieving factors: prescription - ibuprofen , tylenol  and ice  PRECAUTIONS: Shoulder see protocol in file box  RED FLAGS: None   WEIGHT BEARING RESTRICTIONS: unable to use right arm per protocol   FALLS:  Has patient fallen in last 6 months? Yes. 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   NEXT MD VISIT: 03/12/2024  OBJECTIVE:  Note: Objective measures were completed at Evaluation unless otherwise noted.  DIAGNOSTIC FINDINGS:  01/27/2024 MRI Findings Bones: Mild AC joint arthrosis. Minimal joint is unremarkable with no fracture or  contusion pattern. Mild subchondral cyst are seen in the posterior lateral humeral head likely related to insertional tendinosis of the adjacent supraspinatus infraspinatus tendons. No subacromial or subdeltoid bursal collection is present.   Rotator cuff: There is mild insertional tendinosis of the supraspinatus tendon with mild bursal surface fraying. There is no significant partial or full thickness tear. Subscapularis and teres minor tendons are unremarkable.   Labrum and biceps tendon: Biceps tendon is intact. There is a nondisplaced posterior labral tear best seen on axial image 12 through 15 of series 3. There is a questionable chronic nondisplaced anterior labral tear with slight truncation of the anterior glenoid. Correlation for remote dislocation.  Patient-Specific Activity Scoring Scheme  0 represents "unable to perform." 10 represents "able to perform at prior level. 0 1 2 3 4 5 6 7 8 9  10 (Date and Score)   Activity Eval  03/04/2024    1.  Computer work  0    2. Playing drums 0     3. Handy man tasks 0   4.    5.    Score 0    Total score = sum of the activity scores/number of activities Minimum detectable change (90%CI) for average score = 2 points Minimum detectable change (90%CI) for single activity score = 3 points  COGNITION: 03/04/2024 Overall cognitive status: Within functional limits for tasks assessed     SENSATION: 03/04/2024 [facility]  POSTURE: 03/04/2024 Head forward, rounded shoulder,  UPPER EXTREMITY ROM:   ROM Right Eval 03/04/2024 Right 03/09/2024 PROM in supine Right 03/27/24  Supine   Shoulder flexion Supine P: 52* 90 limited by protocol PROM 140*, AROM 100*  Shoulder extension     Shoulder abduction Supine P: 41* 45 limited by protocol PROM 90*, AROM 70*  Shoulder adduction     Shoulder internal rotation Supine To stomach Limited by protocol    Shoulder external rotation Supine 20*limited by protocol  PROM 20* at side, AROM 15*  at side   Elbow flexion     Elbow extension -5*    Wrist flexion     Wrist extension     Wrist ulnar deviation     Wrist radial deviation  Wrist pronation     Wrist supination     (Blank rows = not tested)  UPPER EXTREMITY MMT:  MMT Right Eval Not tested due to protocol  Shoulder flexion   Shoulder extension   Shoulder abduction   Shoulder adduction   Shoulder internal rotation   Shoulder external rotation   Middle trapezius   Lower trapezius   Elbow flexion   Elbow extension   Wrist flexion   Wrist extension   Wrist ulnar deviation   Wrist radial deviation   Wrist pronation   Wrist supination   Grip strength (lbs)   (Blank rows = not tested)  PALPATION:  03/04/2024: Pain with palpation to upper, middle & inferior trapezius muscles, pectoralis muscle, deltoid muscle, biceps muscle and ribs 4-8 under axilla.   EDEMA: 03/04/2024 Localized edema to right shoulder, scapula and axilla.                    TODAY'S TREATMENT:                                                                                                       DATE:    03/27/24  ROM check, education on progress and  next steps forward in current protocol   Shoulder PROM/stretching as permissible given protocol Shoulder ABD AAROM  with dowel x10 to about 90* Shoulder ER AAROM with towel tucked to side x10  Prone shoulder extensions 0# x10        03/09/2024 Therex: Supine wand AAROM 0-90 2 x 10  Supine wand AAROM protraction hold 3 sec 2 x 15 Seated scapular retraction 3 sec hold x 10  Additional time spent in review of HEP, protocol and progressions.    Manual: Rt shoulder passive range assessment and mobility c distraction within protocol    TODAY'S TREATMENT:                                                                                                       DATE:  03/04/2024 Therapeutic Exercise: HEP instruction/performance c cues for techniques, handout provided.  Trial set  performed of each for comprehension and symptom assessment.  See below for exercise list  Self-care: PT suggested sling to properly support patient arm.  PT demo & verbal cues on sling use.  Manual therapy: PROM supine for flexion and abduction.   PATIENT EDUCATION: 03/09/2024 Education details: HEP update Person educated: Patient Education method: Explanation, Demonstration, Verbal cues, and Handouts Education comprehension: verbalized understanding, returned demonstration, and verbal cues required  HOME EXERCISE PROGRAM:  Access Code: URL: [email or web address] Date: 03/27/2024 Prepared by: Josette Rough  Exercises - Seated Isometric Shoulder  External Rotation with Towel  - 3-4 x daily - 7 x weekly - 1 sets - 10 reps - 5 seconds hold - Seated Isometric Shoulder Internal Rotation with Towel  - 3-4 x daily - 7 x weekly - 1 sets - 10 reps - 5 seconds hold - Seated Isometric Shoulder Flexion  - 3-4 x daily - 7 x weekly - 1 sets - 10 reps - 5 seconds hold - Standing Isometric Shoulder Extension with Doorway - Arm Bent  - 3-4 x daily - 7 x weekly - 1 sets - 10 reps - 5 seconds hold - Supine Chin Tuck  - 3-4 x daily - 7 x weekly - 1 sets - 10 reps - 5 seconds hold - Seated Scapular Retraction  - 3-5 x daily - 7 x weekly - 1 sets - 5 reps - 3-5 hold - Supine Shoulder Protraction with Dowel  - 1-2 x daily - 7 x weekly - 1 sets - 10 reps - 3-5 hold - Supine Shoulder Flexion AAROM with Dowel  - 1 x daily - 7 x weekly - 3 sets - 10 reps - Supine Shoulder Abduction AAROM with Dowel  - 1 x daily - 7 x weekly - 2-3 sets - 10 reps - Seated Shoulder External Rotation AAROM with Dowel  - 1 x daily - 7 x weekly - 2-3 sets - 10 reps - Prone Shoulder Extension  - 1 x daily - 7 x weekly - 1-2 sets - 10 reps   ASSESSMENT:  CLINICAL IMPRESSION:  Arrives today just past the 4 week mark- updated ROM measures and HEP as appropriate given the next step in his protocol. He was  able to repeat exercises with good respect for current ROM limitations in current stage of protocol. Able to complete all interventions today with pain staying about 3/10/not exceeding this. Will continue to progress as appropriate.   OBJECTIVE IMPAIRMENTS: decreased activity tolerance, decreased endurance, decreased knowledge of condition, decreased ROM, decreased strength, increased edema, increased muscle spasms, impaired UE functional use, postural dysfunction, and pain.   ACTIVITY LIMITATIONS: carrying, lifting, sleeping, stairs, bathing, dressing, reach over head, hygiene/grooming, and computer for work.   PARTICIPATION LIMITATIONS: meal prep, laundry, driving, occupation, and yard work  PERSONAL FACTORS: Past/current experiences and 1-2 comorbidities: see PMH are also affecting patient's functional outcome.   REHAB POTENTIAL: Good  CLINICAL DECISION MAKING: Stable/uncomplicated  EVALUATION COMPLEXITY: Low   GOALS: Goals reviewed with patient? Yes  SHORT TERM GOALS: (target date for Short term goals 03/26/2024)  1.Patient will demonstrate independent use of home exercise program to maintain progress from in clinic treatments. Baseline: SEE OBJECTIVE DATA Goal status: INITIAL  LONG TERM GOALS: (target dates for all long term goals 05/29/2024)   1. Patient will demonstrate/report pain at worst less than or equal to 2/10 to facilitate minimal limitation in daily activity secondary to pain symptoms. Baseline: SEE OBJECTIVE DATA Goal status: INITIAL   2. Patient will demonstrate independent use of home exercise program to facilitate ability to maintain/progress functional gains from skilled physical therapy services. Baseline: SEE OBJECTIVE DATA Goal status: INITIAL   3.  Patient reports Patient-Specific Activity Score improved to >5 to indicate improvement in functional activities.  Baseline: SEE OBJECTIVE DATA Goal status: INITIAL   4.  Patient will demonstrate right UE MMT  5/5 throughout to facilitate lifting, reaching, carrying at [facility] in daily activity.  Baseline: SEE OBJECTIVE DATA Goal status: INITIAL   5.  Patient will demonstrate right [facility]  joint AROM WFL s symptoms to facilitate usual overhead reaching, self care, dressing at PLOF.  Baseline: SEE OBJECTIVE DATA Goal status: INITIAL   6.  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  Baseline: SEE OBJECTIVE DATA Goal status: INITIAL    PLAN: PT FREQUENCY: 1x/wk for 4 weeks, then 2-3x/wk for 8 weeks  PT DURATION: 12 weeks  PLANNED INTERVENTIONS: 97164- PT Re-evaluation, 97110-Therapeutic exercises, 97530- Therapeutic activity, [id number]- Neuromuscular re-education, 97535- Self Care, 02859- Manual therapy, [id number]- Electrical stimulation (unattended), [id number]- Electrical stimulation (manual), 97016- Vasopneumatic device, 20560 (1-2 muscles), 20561 (3+ muscles)- Dry Needling, Patient/Family education, Taping, Scar mobilization, Cryotherapy, and Moist heat  PLAN FOR NEXT SESSION:  Check on any updates from MD visit.   Follow protocol in file box, Phase 3: 4-8 weeks   AROM restrictions 160* FF, ER 45* at side, 160* ABD, IR behind back to waist level, no cross body ADD until 6 weeks   Josette Rough, PT, DPT 03/27/24 4:04 PM

## 2024-03-29 ENCOUNTER — Other Ambulatory Visit: Payer: Self-pay | Admitting: Family Medicine

## 2024-04-01 ENCOUNTER — Encounter: Payer: Self-pay | Admitting: Physical Therapy

## 2024-04-01 ENCOUNTER — Ambulatory Visit (INDEPENDENT_AMBULATORY_CARE_PROVIDER_SITE_OTHER): Admitting: Physical Therapy

## 2024-04-01 DIAGNOSIS — M25511 Pain in right shoulder: Secondary | ICD-10-CM | POA: Diagnosis not present

## 2024-04-01 DIAGNOSIS — R600 Localized edema: Secondary | ICD-10-CM | POA: Diagnosis not present

## 2024-04-01 DIAGNOSIS — M25611 Stiffness of right shoulder, not elsewhere classified: Secondary | ICD-10-CM | POA: Diagnosis not present

## 2024-04-01 DIAGNOSIS — R293 Abnormal posture: Secondary | ICD-10-CM

## 2024-04-01 DIAGNOSIS — M6281 Muscle weakness (generalized): Secondary | ICD-10-CM | POA: Diagnosis not present

## 2024-04-01 NOTE — Patient Instructions (Signed)
 Active shoulder exercises at counter using shelves: Pretend holding a mug or cup so your hand and shoulders today in the proper position. Flexion -stand facing the counter with your right foot close to the counter on the left foot slightly back.  With each of the activities you are going to weight shift from the back left leg to the front right leg while reaching up with the right hand and when lowering the hand to the counter you are going to shift back to the left leg. Set your posture then move the cup from the counter to the first shelf, reset your posture, lower the cup back to the counter. Do 10 reps Set your posture then moved the cup from the counter to the first shelf, reset your posture moved the cup from the far shelf to the second shelf, reset your posture made the cut back down to the far shelf, reset your posture and move the cup back to the counter.  Do 10 reps Set your posture then moved the cup from the counter to the second shelf in one motion, reset your posture and move the cup from the second shelf back to the counter.  Do 10 reps. 2.   Scaption -stand with your feet facing to the side with your right side beside the counter.  Your right foot should be positioned close to the counter and slightly forward compared to the left foot.  With each of the activities you are going to weight shift from the back leg to the front right leg while reaching up with the right hand and when lowering the hand back to the counter you will shift back to the left leg.  Your hand should say slightly in front of your shoulder. Set your posture then move the cup from the counter to the first shelf, reset your posture, lower the cup back to the counter. Do 10 reps Set your posture then moved the cup from the counter to the first shelf, reset your posture moved the cup from the far shelf to the second shelf, reset your posture made the cut back down to the far shelf, reset your posture and move the cup back to  the counter.  Do 10 reps Set your posture then moved the cup from the counter to the second shelf in one motion, reset your posture and move the cup from the second shelf back to the counter.  Do 10 reps.

## 2024-04-01 NOTE — Therapy (Signed)
 OUTPATIENT PHYSICAL THERAPY TREATMENT   Patient Name: Jordan Robles MRN: [id number] DOB:Feb 27, 1981, 43 y.o., male Today's Date: 04/01/2024  END OF SESSION:  PT End of Session - 04/01/24 1524     Visit Number 4    Number of Visits 25    Date for PT Re-Evaluation 05/29/24    Authorization Type BCBS PPO    Authorization Time Period NO AUTH NEEDED  DED AND OOP NOT MET, PAY TOWARDS OOP, NO COPAY    PT Start Time 1520    PT Stop Time 1601    PT Time Calculation (min) 41 min    Activity Tolerance Patient tolerated treatment well    Behavior During Therapy WFL for tasks assessed/performed             Past Medical History:  Diagnosis Date   ADHD (attention deficit hyperactivity disorder)    Genital warts    GERD (gastroesophageal reflux disease)    Hyperlipidemia    Hypertension    Learning disability    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 Eda, 13 adenomatous polyps and 2 hyperplastic polyps. repeat in one yr   ESOPHAGOGASTRODUODENOSCOPY  07/18/2022   per Dr. Eda, reflux esophagitis, neg for H pylori. 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   HYPERLIPIDEMIA 05/15/2007   LEARNING DISABILITY 05/15/2007    PCP: Johnny Garnette LABOR, MD  REFERRING PROVIDER: Genelle Standing MD  REFERRING DIAG: [id number] (ICD-10-CM) - Chronic right  shoulder pain   THERAPY DIAG:  Muscle weakness (generalized)  Acute pain of right shoulder  Stiffness of right shoulder, not elsewhere classified  Localized edema  Abnormal posture  Rationale for Evaluation and Treatment: Rehabilitation  ONSET DATE: 02/27/2024 right shoulder labral sg repair  SUBJECTIVE:                                                                                                                                                                                      SUBJECTIVE STATEMENT: He went back to work on 7/3 with no issues light duty.  No issues.  New exercises are helping with no pain or issues.    Hand dominance: Right  PERTINENT HISTORY: ADHD, GERD, HTN, HLD, testicular CA  PAIN:  NPRS scale: since last PT  2/10 Pain location: right shoulder anterior Pain description:  discomfort  Aggravating factors: dressing/lifting arm to dress, putting pressure on it/leaning Relieving factors: prescription - ibuprofen , tylenol  and ice  PRECAUTIONS: Shoulder see protocol in file box  RED FLAGS: None   WEIGHT BEARING RESTRICTIONS: unable to use right arm per protocol   FALLS:  Has patient fallen in last 6 months? Yes. 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   NEXT MD VISIT: 04/15/2024  OBJECTIVE:  Note: Objective measures were completed at Evaluation unless otherwise noted.  DIAGNOSTIC FINDINGS:  01/27/2024 MRI Findings Bones: Mild AC joint arthrosis. Minimal joint is unremarkable with no fracture or contusion pattern. Mild  subchondral cyst are seen in the posterior lateral humeral head likely related to insertional tendinosis of the adjacent supraspinatus infraspinatus tendons. No subacromial or subdeltoid bursal collection is present.   Rotator cuff: There is mild insertional tendinosis of the supraspinatus tendon with mild bursal surface fraying. There is no significant partial or full thickness tear. Subscapularis and teres minor tendons are unremarkable.   Labrum and biceps tendon: Biceps tendon is intact. There is a nondisplaced posterior labral tear best seen on axial image 12 through 15 of series 3. There is a questionable chronic nondisplaced anterior labral tear with slight truncation of the anterior glenoid. Correlation for remote dislocation.  Patient-Specific Activity Scoring Scheme  0 represents "unable to perform." 10 represents "able to perform at prior level. 0 1 2 3 4 5 6 7 8 9  10 (Date and Score)   Activity Eval  03/04/2024    1.  Computer work  0    2. Playing drums 0     3. Handy man tasks 0   4.    5.    Score 0    Total score = sum of the activity scores/number of activities Minimum detectable change (90%CI) for average score = 2 points Minimum detectable change (90%CI) for single activity score = 3 points  COGNITION: 03/04/2024 Overall cognitive status: Within functional limits for tasks assessed     SENSATION: 03/04/2024 [facility]  POSTURE: 03/04/2024 Head forward, rounded shoulder,  UPPER EXTREMITY ROM:   ROM Right Eval 03/04/2024 Right 03/09/2024 PROM in supine Right 03/27/24  Supine   Shoulder flexion Supine P: 52* 90 limited by protocol PROM 140*, AROM 100*  Shoulder extension     Shoulder abduction Supine P: 41* 45 limited by protocol PROM 90*, AROM 70*  Shoulder adduction     Shoulder internal rotation Supine To stomach Limited by protocol    Shoulder external rotation Supine 20*limited by protocol  PROM 20* at side, AROM 15* at side   Elbow flexion      Elbow extension -5*    Wrist flexion     Wrist extension     Wrist ulnar deviation     Wrist radial deviation     Wrist  pronation     Wrist supination     (Blank rows = not tested)  UPPER EXTREMITY MMT:  MMT Right Eval Not tested due to protocol  Shoulder flexion   Shoulder extension   Shoulder abduction   Shoulder adduction   Shoulder internal rotation   Shoulder external rotation   Middle trapezius   Lower trapezius   Elbow flexion   Elbow extension   Wrist flexion   Wrist extension   Wrist ulnar deviation   Wrist radial deviation   Wrist pronation   Wrist supination   Grip strength (lbs)   (Blank rows = not tested)  PALPATION:  03/04/2024: Pain with palpation to upper, middle & inferior trapezius muscles, pectoralis muscle, deltoid muscle, biceps muscle and ribs 4-8 under axilla.   EDEMA: 03/04/2024 Localized edema to right shoulder, scapula and axilla.                    TODAY'S TREATMENT:                                                                                                       DATE:  04/01/2024  (5 weeks post op on 7/17) Therapeutic Exercise: Pulleys seated AAROM flexion & scaption 2 minutes ea.  PT cued technique. Pt verbalized understanding.  Active shoulder flexion & scaption moving RUE counter to shoulder level shelf 5 reps; counter to shoulder level to eye level shelf 5 reps;  counter to eye level (2nd shelf) 5 reps.  Prone I (arms by side) 5 reps with wand & 10 reps AROM Prone rows AROM 10 reps Prone T scapula AROM 10 reps Prone Y scapular active range of motion 10 reps Isometric reactive red Thera-Band resistance for internal rotation and external rotation.  10 reps first set & 5 reps second set.  PT demo how to perform at home safely. PT updated HEP including handout with the above exercises including demo and verbal cues on technique.  Patient verbalized understanding after performing in the clinic.   TREATMENT:                                                                                                        DATE:    03/27/24  ROM check, education on progress and  next steps forward in current protocol   Shoulder PROM/stretching as permissible given protocol Shoulder ABD AAROM  with dowel x10 to about 90* Shoulder ER AAROM with towel tucked to side x10  Prone shoulder extensions 0# x10        03/09/2024 Therex: Supine wand AAROM 0-90 2 x 10  Supine wand AAROM protraction hold 3  sec 2 x 15 Seated scapular retraction 3 sec hold x 10  Additional time spent in review of HEP, protocol and progressions.    Manual: Rt shoulder passive range assessment and mobility c distraction within protocol   PATIENT EDUCATION: 03/09/2024 Education details: HEP update Person educated: Patient Education method: Explanation, Demonstration, Verbal cues, and Handouts Education comprehension: verbalized understanding, returned demonstration, and verbal cues required  HOME EXERCISE PROGRAM: Access Code: URL: [email or web address] Date: 04/01/2024 Prepared by: Grayce Spatz  Exercises - Seated Isometric Shoulder External Rotation with Towel  - 3-4 x daily - 7 x weekly - 1 sets - 10 reps - 5 seconds hold - Seated Isometric Shoulder Internal Rotation with Towel  - 3-4 x daily - 7 x weekly - 1 sets - 10 reps - 5 seconds hold - Seated Isometric Shoulder Flexion  - 3-4 x daily - 7 x weekly - 1 sets - 10 reps - 5 seconds hold - Standing Isometric Shoulder Extension with Doorway - Arm Bent  - 3-4 x daily - 7 x weekly - 1 sets - 10 reps - 5 seconds hold - Supine Chin Tuck  - 3-4 x daily - 7 x weekly - 1 sets - 10 reps - 5 seconds hold - Seated Scapular Retraction  - 3-5 x daily - 7 x weekly - 1 sets - 5 reps - 3-5 hold - Supine Shoulder Protraction with Dowel  - 1-2 x daily - 7 x weekly - 1 sets - 10 reps - 3-5 hold - Supine Shoulder Flexion AAROM with Dowel  - 1 x daily - 7 x weekly - 3 sets - 10 reps -  Supine Shoulder Abduction AAROM with Dowel  - 1 x daily - 7 x weekly - 2-3 sets - 10 reps - Seated Shoulder External Rotation AAROM with Dowel  - 1 x daily - 7 x weekly - 2-3 sets - 10 reps - Prone Shoulder Extension  - 1 x daily - 7 x weekly - 1-2 sets - 10 reps - Seated Shoulder Flexion AAROM with Pulley Behind  - 1 x daily - 7 x weekly - 1 sets - 1 reps - 2-3 minutes hold - Seated Shoulder Scaption AAROM with Pulley at Side  - 1 x daily - 7 x weekly - 1 sets - 1 reps - 2-3 minutes hold - Prone Scapular Retraction  - 1 x daily - 7 x weekly - 2 sets - 10 reps - 5 seconds hold - Prone Scapular Retraction Arms at Side  - 1 x daily - 7 x weekly - 2 sets - 10 reps - 5 seconds hold - Prone Shoulder Row  - 1 x daily - 7 x weekly - 2 sets - 10 reps - 5 seconds hold - Prone Scapular Retraction Y  - 1 x daily - 7 x weekly - 2 sets - 10 reps - 5 seconds hold - Shoulder External Rotation Walkouts with Anchored Resistance  - 1 x daily - 7 x weekly - 2 sets - 10 reps - 5 seconds hold - Shoulder Internal Rotation Reactive Isometrics  - 1 x daily - 7 x weekly - 2 sets - 10 reps - 5 seconds hold   Active shoulder exercises at counter using shelves: Pretend holding a mug or cup so your hand and shoulders today in the proper position. Flexion -stand facing the counter with your right foot close to the counter on the left foot slightly back.  With each of the  activities you are going to weight shift from the back left leg to the front right leg while reaching up with the right hand and when lowering the hand to the counter you are going to shift back to the left leg. Set your posture then move the cup from the counter to the first shelf, reset your posture, lower the cup back to the counter. Do 10 reps Set your posture then moved the cup from the counter to the first shelf, reset your posture moved the cup from the far shelf to the second shelf, reset your posture made the cut back down to the far shelf, reset your  posture and move the cup back to the counter.  Do 10 reps Set your posture then moved the cup from the counter to the second shelf in one motion, reset your posture and move the cup from the second shelf back to the counter.  Do 10 reps. 2.   Scaption -stand with your feet facing to the side with your right side beside the counter.  Your right foot should be positioned close to the counter and slightly forward compared to the left foot.  With each of the activities you are going to weight shift from the back leg to the front right leg while reaching up with the right hand and when lowering the hand back to the counter you will shift back to the left leg.  Your hand should say slightly in front of your shoulder. Set your posture then move the cup from the counter to the first shelf, reset your posture, lower the cup back to the counter. Do 10 reps Set your posture then moved the cup from the counter to the first shelf, reset your posture moved the cup from the far shelf to the second shelf, reset your posture made the cut back down to the far shelf, reset your posture and move the cup back to the counter.  Do 10 reps Set your posture then moved the cup from the counter to the second shelf in one motion, reset your posture and move the cup from the second shelf back to the counter.  Do 10 reps.    ASSESSMENT:  CLINICAL IMPRESSION: PT was able to update and progress HEP in accordance with protocol with no pain increases.  He appears to understand updated HEP.  Prone scapular exercises especially Y were challenging for him.  Patient continues to benefit from skilled PT.  OBJECTIVE IMPAIRMENTS: decreased activity tolerance, decreased endurance, decreased knowledge of condition, decreased ROM, decreased strength, increased edema, increased muscle spasms, impaired UE functional use, postural dysfunction, and pain.   ACTIVITY LIMITATIONS: carrying, lifting, sleeping, stairs, bathing, dressing, reach over  head, hygiene/grooming, and computer for work.   PARTICIPATION LIMITATIONS: meal prep, laundry, driving, occupation, and yard work  PERSONAL FACTORS: Past/current experiences and 1-2 comorbidities: see PMH are also affecting patient's functional outcome.   REHAB POTENTIAL: Good  CLINICAL DECISION MAKING: Stable/uncomplicated  EVALUATION COMPLEXITY: Low   GOALS: Goals reviewed with patient? Yes  SHORT TERM GOALS: (target date for Short term goals 03/26/2024)  1.Patient will demonstrate independent use of home exercise program to maintain progress from in clinic treatments. Baseline: SEE OBJECTIVE DATA Goal status:  MET 03/27/2024  LONG TERM GOALS: (target dates for all long term goals 05/29/2024)   1. Patient will demonstrate/report pain at worst less than or equal to 2/10 to facilitate minimal limitation in daily activity secondary to pain symptoms. Baseline: SEE OBJECTIVE DATA Goal status:  Ongoing  04/01/2024   2. Patient will demonstrate independent use of home exercise program to facilitate ability to maintain/progress functional gains from skilled physical therapy services. Baseline: SEE OBJECTIVE DATA Goal status: Ongoing  04/01/2024   3.  Patient reports Patient-Specific Activity Score improved to >5 to indicate improvement in functional activities.  Baseline: SEE OBJECTIVE DATA Goal status: Ongoing  04/01/2024   4.  Patient will demonstrate right UE MMT 5/5 throughout to facilitate lifting, reaching, carrying at [facility] in daily activity.  Baseline: SEE OBJECTIVE DATA Goal status: Ongoing  04/01/2024   5.  Patient will demonstrate right GH joint AROM WFL s symptoms to facilitate usual overhead reaching, self care, dressing at PLOF.  Baseline: SEE OBJECTIVE DATA Goal status: Ongoing  04/01/2024   6.  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  Baseline: SEE OBJECTIVE DATA Goal status: Ongoing  04/01/2024    PLAN: PT FREQUENCY: 1x/wk for 4  weeks, then 2-3x/wk for 8 weeks  PT DURATION: 12 weeks  PLANNED INTERVENTIONS: 97164- PT Re-evaluation, 97110-Therapeutic exercises, 97530- Therapeutic activity, 97112- Neuromuscular re-education, 97535- Self Care, 02859- Manual therapy, [id number]- Electrical stimulation (unattended), [id number]- Electrical stimulation (manual), 97016- Vasopneumatic device, 20560 (1-2 muscles), 20561 (3+ muscles)- Dry Needling, Patient/Family education, Taping, Scar mobilization, Cryotherapy, and Moist heat  PLAN FOR NEXT SESSION: Check active range of motion.  Check updated HEP from last session.  Progress HEP as indicated.  Follow protocol in file box, Phase 3: 4-8 weeks (03/26/24 - 04/23/24)  AROM restrictions 160* FF, ER 45* at side, 160* ABD, IR behind back to waist level, no cross body ADD until 6 weeks (04/09/2024)   Grayce Spatz, PT, DPT 04/01/2024, 4:19 PM

## 2024-04-02 ENCOUNTER — Other Ambulatory Visit: Payer: Self-pay | Admitting: Family Medicine

## 2024-04-02 MED ORDER — AMPHETAMINE-DEXTROAMPHETAMINE 10 MG PO TABS: 10.0000 mg | ORAL_TABLET | Freq: Two times a day (BID) | ORAL | 0 refills | Status: DC

## 2024-04-02 NOTE — Telephone Encounter (Signed)
 Done

## 2024-04-02 NOTE — Telephone Encounter (Signed)
 Copied from CRM [id number]. Topic: Clinical - Medication Refill >> Apr 02, 2024  9:36 AM Delon T wrote: Medication: amphetamine -dextroamphetamine  (ADDERALL) 10 MG tablet   Has the patient contacted their pharmacy? Yes (Agent: If no, request that the patient contact the pharmacy for the refill. If patient does not wish to contact the pharmacy document the reason why and proceed with request.) (Agent: If yes, when and what did the pharmacy advise?)  This is the patient's preferred pharmacy:  [id number] - [place], [place] - [address] AT [facility] [address] [place] [address] Phone: [phone] Fax: [phone]    Is this the correct pharmacy for this prescription? Yes If no, delete pharmacy and type the correct one.   Has the prescription been filled recently? Yes  Is the patient out of the medication? Yes  Has the patient been seen for an appointment in the last year OR does the patient have an upcoming appointment? Yes  Can we respond through MyChart? Yes  Agent: Please be advised that Rx refills may take up to 3 business days. We ask that you follow-up with your pharmacy.

## 2024-04-03 ENCOUNTER — Encounter: Payer: Self-pay | Admitting: Physical Therapy

## 2024-04-03 ENCOUNTER — Ambulatory Visit (INDEPENDENT_AMBULATORY_CARE_PROVIDER_SITE_OTHER): Admitting: Physical Therapy

## 2024-04-03 DIAGNOSIS — M6281 Muscle weakness (generalized): Secondary | ICD-10-CM

## 2024-04-03 DIAGNOSIS — M25511 Pain in right shoulder: Secondary | ICD-10-CM | POA: Diagnosis not present

## 2024-04-03 DIAGNOSIS — M25611 Stiffness of right shoulder, not elsewhere classified: Secondary | ICD-10-CM

## 2024-04-03 DIAGNOSIS — R600 Localized edema: Secondary | ICD-10-CM | POA: Diagnosis not present

## 2024-04-03 DIAGNOSIS — R293 Abnormal posture: Secondary | ICD-10-CM

## 2024-04-03 NOTE — Therapy (Signed)
 OUTPATIENT PHYSICAL THERAPY TREATMENT   Patient Name: Jordan Robles MRN: [id number] DOB:Sep 14, 1981, 43 y.o., male Today's Date: 04/03/2024  END OF SESSION:  PT End of Session - 04/03/24 1148     Visit Number 5    Number of Visits 25    Date for PT Re-Evaluation 05/29/24    Authorization Type [employer] PPO    Authorization Time Period NO AUTH NEEDED  DED AND OOP NOT MET, PAY TOWARDS OOP, NO COPAY    PT Start Time 1148    PT Stop Time 1230    PT Time Calculation (min) 42 min    Activity Tolerance Patient tolerated treatment well    Behavior During Therapy WFL for tasks assessed/performed          Past Medical History:  Diagnosis Date   ADHD (attention deficit hyperactivity disorder)    Genital warts    GERD (gastroesophageal reflux disease)    Hyperlipidemia    Hypertension    Learning disability    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 Eda, 13 adenomatous polyps and 2 hyperplastic polyps. repeat in one yr   ESOPHAGOGASTRODUODENOSCOPY  07/18/2022   per Dr. Eda, reflux esophagitis, neg for H pylori. 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   HYPERLIPIDEMIA 05/15/2007   LEARNING DISABILITY 05/15/2007    PCP: Johnny Garnette LABOR, MD  REFERRING PROVIDER: Genelle Standing MD  REFERRING DIAG: [id number] (ICD-10-CM) - Chronic right shoulder  pain   THERAPY DIAG:  Muscle weakness (generalized)  Acute pain of right shoulder  Stiffness of right shoulder, not elsewhere classified  Localized edema  Abnormal posture  Rationale for Evaluation and Treatment: Rehabilitation  ONSET DATE: 02/27/2024 right shoulder labral sg repair  SUBJECTIVE:                                                                                                                                                                                      SUBJECTIVE STATEMENT: Pt states he bought pulleys for home. Has been compliant with HEP. Pt has been pleased  with progress so far.   Hand dominance: Right  PERTINENT HISTORY: ADHD, GERD, HTN, HLD, testicular CA  PAIN:  NPRS scale: 0/10 Pain location: right shoulder anterior Pain description:  discomfort  Aggravating factors: dressing/lifting arm to dress, putting pressure on it/leaning Relieving factors: prescription - ibuprofen , tylenol  and ice  PRECAUTIONS: Shoulder see protocol in file box  RED FLAGS: None   WEIGHT BEARING RESTRICTIONS: unable to use right arm per protocol   FALLS:  Has patient fallen in last 6 months? Yes. 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   NEXT MD VISIT: 04/15/2024  OBJECTIVE:  Note: Objective measures were completed at Evaluation unless otherwise noted.  DIAGNOSTIC FINDINGS:  01/27/2024 MRI Findings Bones: Mild AC joint arthrosis. Minimal joint is unremarkable with no fracture or contusion pattern. Mild subchondral cyst are seen in the  posterior lateral humeral head likely related to insertional tendinosis of the adjacent supraspinatus infraspinatus tendons. No subacromial or subdeltoid bursal collection is present.   Rotator cuff: There is mild insertional tendinosis of the supraspinatus tendon with mild bursal surface fraying. There is no significant partial or full thickness tear. Subscapularis and teres minor tendons are unremarkable.   Labrum and biceps tendon: Biceps tendon is intact. There is a nondisplaced posterior labral tear best seen on axial image 12 through 15 of series 3. There is a questionable chronic nondisplaced anterior labral tear with slight truncation of the anterior glenoid. Correlation for remote dislocation.  Patient-Specific Activity Scoring Scheme  0 represents "unable to perform." 10 represents "able to perform at prior level. 0 1 2 3 4 5 6 7 8 9  10 (Date and Score)   Activity Eval  03/04/2024    1.  Computer work  0    2. Playing drums 0     3. Handy man tasks 0   4.    5.    Score 0    Total score = sum of the activity scores/number of activities Minimum detectable change (90%CI) for average score = 2 points Minimum detectable change (90%CI) for single activity score = 3 points  COGNITION: 03/04/2024 Overall cognitive status: Within functional limits for tasks assessed     SENSATION: 03/04/2024 [facility]  POSTURE: 03/04/2024 Head forward, rounded shoulder,  UPPER EXTREMITY ROM:   ROM Right Eval 03/04/2024 Right 03/09/2024 PROM in supine Right 03/27/24  Supine  Right 04/03/24 Supine  Shoulder flexion Supine P: 52* 90 limited by protocol PROM 140*, AROM 100* AROM 135  Shoulder extension      Shoulder abduction Supine P: 41* 45 limited by protocol PROM 90*, AROM 70* AROM 105  Shoulder adduction      Shoulder internal rotation Supine To stomach Limited by protocol     Shoulder external rotation Supine 20*limited by protocol  PROM 20* at side, AROM 15* at side  AROM  40 at side  Elbow flexion      Elbow extension -5*     Wrist flexion      Wrist extension      Wrist ulnar deviation      Wrist radial  deviation      Wrist pronation      Wrist supination      (Blank rows = not tested)  UPPER EXTREMITY MMT:  MMT Right Eval Not tested due to protocol  Shoulder flexion   Shoulder extension   Shoulder abduction   Shoulder adduction   Shoulder internal rotation   Shoulder external rotation   Middle trapezius   Lower trapezius   Elbow flexion   Elbow extension   Wrist flexion   Wrist extension   Wrist ulnar deviation   Wrist radial deviation   Wrist pronation   Wrist supination   Grip strength (lbs)   (Blank rows = not tested)  PALPATION:  03/04/2024: Pain with palpation to upper, middle & inferior trapezius muscles, pectoralis muscle, deltoid muscle, biceps muscle and ribs 4-8 under axilla.   EDEMA: 03/04/2024 Localized edema to right shoulder, scapula and axilla.                    TODAY'S TODAY'S TREATMENT:                                                                                                       DATE:  04/03/2024  Therapeutic Exercise: Pulleys seated AAROM flexion, scaption, horizontal shoulder abd x 2 minutes ea.  PT cued technique. Pt verbalized understanding.  Prone I 2x10 Prone row 2x10 Prone T 2x10 Prone Y 2x10 Sidelying ER AROM to <45 deg 2x10 Sidelying abd AROM 2x10 Isometric reactive red Thera-Band resistance for internal rotation, external rotation, row, flexion.  x10 reps first set Rechecked ROM    TREATMENT:                                                                                                       DATE:  04/01/2024  (5 weeks post op on 7/17) Therapeutic Exercise: Pulleys seated AAROM flexion & scaption 2 minutes ea.  PT cued technique. Pt verbalized understanding.  Active shoulder flexion & scaption moving RUE counter to shoulder level shelf 5 reps; counter to shoulder level to eye level  shelf 5 reps;  counter to eye level (2nd shelf) 5 reps.  Prone I (arms by side) 5 reps with wand & 10 reps AROM Prone rows AROM 10 reps Prone T scapula AROM 10 reps Prone Y scapular active range of motion 10 reps Isometric reactive red Thera-Band resistance for internal rotation and external rotation.  10 reps first set & 5 reps second set.  PT demo how to perform at home safely. PT updated HEP including handout with the above exercises including demo and verbal cues on technique.  Patient verbalized understanding after  performing in the clinic.   TREATMENT:                                                                                                       DATE:  03/27/24  ROM check, education on progress and  next steps forward in current protocol   Shoulder PROM/stretching as permissible given protocol Shoulder ABD AAROM  with dowel x10 to about 90* Shoulder ER AAROM with towel tucked to side x10  Prone shoulder extensions 0# x10        PATIENT EDUCATION: 03/09/2024 Education details: HEP update Person educated: Patient Education method: Programmer, multimedia, Demonstration, Verbal cues, and Handouts Education comprehension: verbalized understanding, returned demonstration, and verbal cues required  HOME EXERCISE PROGRAM: Access Code: URL: [email or web address] Date: 04/01/2024 Prepared by: Grayce Spatz  Exercises - Seated Isometric Shoulder External Rotation with Towel  - 3-4 x daily - 7 x weekly - 1 sets - 10 reps - 5 seconds hold - Seated Isometric Shoulder Internal Rotation with Towel  - 3-4 x daily - 7 x weekly - 1 sets - 10 reps - 5 seconds hold - Seated Isometric Shoulder Flexion  - 3-4 x daily - 7 x weekly - 1 sets - 10 reps - 5 seconds hold - Standing Isometric Shoulder Extension with Doorway - Arm Bent  - 3-4 x daily - 7 x weekly - 1 sets - 10 reps - 5 seconds hold - Supine Chin Tuck  - 3-4 x daily - 7 x weekly - 1 sets - 10 reps - 5 seconds  hold - Seated Scapular Retraction  - 3-5 x daily - 7 x weekly - 1 sets - 5 reps - 3-5 hold - Supine Shoulder Protraction with Dowel  - 1-2 x daily - 7 x weekly - 1 sets - 10 reps - 3-5 hold - Supine Shoulder Flexion AAROM with Dowel  - 1 x daily - 7 x weekly - 3 sets - 10 reps - Supine Shoulder Abduction AAROM with Dowel  - 1 x daily - 7 x weekly - 2-3 sets - 10 reps - Seated Shoulder External Rotation AAROM with Dowel  - 1 x daily - 7 x weekly - 2-3 sets - 10 reps - Prone Shoulder Extension  - 1 x daily - 7 x weekly - 1-2 sets - 10 reps - Seated Shoulder Flexion AAROM with Pulley Behind  - 1 x daily - 7 x weekly - 1 sets - 1 reps - 2-3 minutes hold - Seated Shoulder Scaption AAROM with Pulley at Side  - 1 x daily - 7 x weekly - 1 sets - 1 reps - 2-3 minutes hold - Prone Scapular Retraction  - 1 x daily - 7 x weekly - 2 sets - 10 reps - 5 seconds hold - Prone Scapular Retraction Arms at Side  - 1 x daily - 7 x weekly - 2 sets - 10 reps - 5 seconds hold - Prone Shoulder Row  - 1 x daily - 7 x weekly - 2 sets - 10 reps - 5 seconds  hold - Prone Scapular Retraction Y  - 1 x daily - 7 x weekly - 2 sets - 10 reps - 5 seconds hold - Shoulder External Rotation Walkouts with Anchored Resistance  - 1 x daily - 7 x weekly - 2 sets - 10 reps - 5 seconds hold - Shoulder Internal Rotation Reactive Isometrics  - 1 x daily - 7 x weekly - 2 sets - 10 reps - 5 seconds hold   Active shoulder exercises at counter using shelves: Pretend holding a mug or cup so your hand and shoulders today in the proper position. Flexion -stand facing the counter with your right foot close to the counter on the left foot slightly back.  With each of the activities you are going to weight shift from the back left leg to the front right leg while reaching up with the right hand and when lowering the hand to the counter you are going to shift back to the left leg. Set your posture then move the cup from the counter to the first shelf,  reset your posture, lower the cup back to the counter. Do 10 reps Set your posture then moved the cup from the counter to the first shelf, reset your posture moved the cup from the far shelf to the second shelf, reset your posture made the cut back down to the far shelf, reset your posture and move the cup back to the counter.  Do 10 reps Set your posture then moved the cup from the counter to the second shelf in one motion, reset your posture and move the cup from the second shelf back to the counter.  Do 10 reps. 2.   Scaption -stand with your feet facing to the side with your right side beside the counter.  Your right foot should be positioned close to the counter and slightly forward compared to the left foot.  With each of the activities you are going to weight shift from the back leg to the front right leg while reaching up with the right hand and when lowering the hand back to the counter you will shift back to the left leg.  Your hand should say slightly in front of your shoulder. Set your posture then move the cup from the counter to the first shelf, reset your posture, lower the cup back to the counter. Do 10 reps Set your posture then moved the cup from the counter to the first shelf, reset your posture moved the cup from the far shelf to the second shelf, reset your posture made the cut back down to the far shelf, reset your posture and move the cup back to the counter.  Do 10 reps Set your posture then moved the cup from the counter to the second shelf in one motion, reset your posture and move the cup from the second shelf back to the counter.  Do 10 reps.    ASSESSMENT:  CLINICAL IMPRESSION: Increased reps for pt's current HEP. Pt able to tolerate well. Pt is demonstrating improving overall ROM. He continues to progress well within his protocol.   OBJECTIVE IMPAIRMENTS: decreased activity tolerance, decreased endurance, decreased knowledge of condition, decreased ROM, decreased  strength, increased edema, increased muscle spasms, impaired UE functional use, postural dysfunction, and pain.   ACTIVITY LIMITATIONS: carrying, lifting, sleeping, stairs, bathing, dressing, reach over head, hygiene/grooming, and computer for work.   PARTICIPATION LIMITATIONS: meal prep, laundry, driving, occupation, and yard work  PERSONAL FACTORS: Past/current experiences and 1-2 comorbidities:  see PMH are also affecting patient's functional outcome.   REHAB POTENTIAL: Good  CLINICAL DECISION MAKING: Stable/uncomplicated  EVALUATION COMPLEXITY: Low   GOALS: Goals reviewed with patient? Yes  SHORT TERM GOALS: (target date for Short term goals 03/26/2024)  1.Patient will demonstrate independent use of home exercise program to maintain progress from in clinic treatments. Baseline: SEE OBJECTIVE DATA Goal status:  MET 03/27/2024  LONG TERM GOALS: (target dates for all long term goals 05/29/2024)   1. Patient will demonstrate/report pain at worst less than or equal to 2/10 to facilitate minimal limitation in daily activity secondary to pain symptoms. Baseline: SEE OBJECTIVE DATA Goal status:  Ongoing  04/01/2024   2. Patient will demonstrate independent use of home exercise program to facilitate ability to maintain/progress functional gains from skilled physical therapy services. Baseline: SEE OBJECTIVE DATA Goal status: Ongoing  04/01/2024   3.  Patient reports Patient-Specific Activity Score improved to >5 to indicate improvement in functional activities.  Baseline: SEE OBJECTIVE DATA Goal status: Ongoing  04/01/2024   4.  Patient will demonstrate right UE MMT 5/5 throughout to facilitate lifting, reaching, carrying at [facility] in daily activity.  Baseline: SEE OBJECTIVE DATA Goal status: Ongoing  04/01/2024   5.  Patient will demonstrate right GH joint AROM WFL s symptoms to facilitate usual overhead reaching, self care, dressing at PLOF.  Baseline: SEE OBJECTIVE DATA Goal status:  Ongoing  04/01/2024   6.  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  Baseline: SEE OBJECTIVE DATA Goal status: Ongoing  04/01/2024    PLAN: PT FREQUENCY: 1x/wk for 4 weeks, then 2-3x/wk for 8 weeks  PT DURATION: 12 weeks  PLANNED INTERVENTIONS: 97164- PT Re-evaluation, 97110-Therapeutic exercises, 97530- Therapeutic activity, 97112- Neuromuscular re-education, 97535- Self Care, 02859- Manual therapy, [id number]- Electrical stimulation (unattended), [id number]- Electrical stimulation (manual), 97016- Vasopneumatic device, 20560 (1-2 muscles), 20561 (3+ muscles)- Dry Needling, Patient/Family education, Taping, Scar mobilization, Cryotherapy, and Moist heat  PLAN FOR NEXT SESSION: Check active range of motion.  Check updated HEP from last session.  Progress HEP as indicated.  Follow protocol in file box, Phase 3: 4-8 weeks (03/26/24 - 04/23/24)  AROM restrictions 160* FF, ER 45* at side, 160* ABD, IR behind back to waist level, no cross body ADD until 6 weeks (04/09/2024)   Lisa Blakeman April Ma L Bryttany Tortorelli, PT, DPT 04/03/2024, 11:49 AM

## 2024-04-05 ENCOUNTER — Encounter: Payer: Self-pay | Admitting: Family Medicine

## 2024-04-06 ENCOUNTER — Encounter

## 2024-04-06 MED ORDER — METOPROLOL SUCCINATE ER 100 MG PO TB24: 100.0000 mg | ORAL_TABLET | Freq: Two times a day (BID) | ORAL | 3 refills | Status: DC

## 2024-04-06 NOTE — Telephone Encounter (Signed)
 Yes take the 100 mg Metoprolol  twice daily. I sent in a new RX to reflect this

## 2024-04-08 ENCOUNTER — Ambulatory Visit (INDEPENDENT_AMBULATORY_CARE_PROVIDER_SITE_OTHER): Admitting: Rehabilitative and Restorative Service Providers"

## 2024-04-08 ENCOUNTER — Encounter: Payer: Self-pay | Admitting: Rehabilitative and Restorative Service Providers"

## 2024-04-08 DIAGNOSIS — M25611 Stiffness of right shoulder, not elsewhere classified: Secondary | ICD-10-CM | POA: Diagnosis not present

## 2024-04-08 DIAGNOSIS — M25511 Pain in right shoulder: Secondary | ICD-10-CM

## 2024-04-08 DIAGNOSIS — R600 Localized edema: Secondary | ICD-10-CM

## 2024-04-08 DIAGNOSIS — R293 Abnormal posture: Secondary | ICD-10-CM

## 2024-04-08 DIAGNOSIS — M6281 Muscle weakness (generalized): Secondary | ICD-10-CM

## 2024-04-08 NOTE — Therapy (Signed)
 OUTPATIENT PHYSICAL THERAPY TREATMENT   Patient Name: Jordan Robles MRN: [id number] DOB:08-25-1981, 43 y.o., male Today's Date: 04/08/2024  END OF SESSION:  PT End of Session - 04/08/24 1543     Visit Number 6    Number of Visits 25    Date for PT Re-Evaluation 05/29/24    Authorization Type [employer] PPO    Authorization Time Period NO AUTH NEEDED  DED AND OOP NOT MET, PAY TOWARDS OOP, NO COPAY    PT Start Time 1520    PT Stop Time 1558    PT Time Calculation (min) 38 min    Activity Tolerance Patient tolerated treatment well    Behavior During Therapy WFL for tasks assessed/performed           Past Medical History:  Diagnosis Date   ADHD (attention deficit hyperactivity disorder)    Genital warts    GERD (gastroesophageal reflux disease)    Hyperlipidemia    Hypertension    Learning disability    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 Eda, 13 adenomatous polyps and 2 hyperplastic polyps. repeat in one yr   ESOPHAGOGASTRODUODENOSCOPY  07/18/2022   per Dr. Eda, reflux esophagitis, neg for H pylori. 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   HYPERLIPIDEMIA 05/15/2007   LEARNING DISABILITY 05/15/2007    PCP: Johnny Garnette LABOR, MD  REFERRING PROVIDER: Genelle Standing MD  REFERRING DIAG: [phone] (ICD-10-CM) - Chronic right shoulder  pain   THERAPY DIAG:  Muscle weakness (generalized)  Acute pain of right shoulder  Stiffness of right shoulder, not elsewhere classified  Localized edema  Abnormal posture  Rationale for Evaluation and Treatment: Rehabilitation  ONSET DATE: 02/27/2024 right shoulder labral sg repair  SUBJECTIVE:                                                                                                                                                                                      SUBJECTIVE STATEMENT: Pt indicated no real pain complaints.  An occasional slight but nothing major.  Hand dominance: Right  PERTINENT HISTORY: ADHD, GERD, HTN, HLD, testicular CA  PAIN:  NPRS scale: 0/10 Pain location: right shoulder anterior Pain description:  discomfort  Aggravating factors: dressing/lifting arm to dress, putting pressure on it/leaning Relieving factors: prescription - ibuprofen , tylenol  and ice  PRECAUTIONS: Shoulder see protocol in file box  RED FLAGS: None   WEIGHT BEARING RESTRICTIONS: unable to use right arm per protocol   FALLS:  Has patient fallen in last 6 months? Yes. 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   NEXT MD VISIT: 04/15/2024  OBJECTIVE:  Note: Objective measures were completed at Evaluation unless otherwise noted.  DIAGNOSTIC FINDINGS:  01/27/2024 MRI Findings Bones: Mild AC joint arthrosis. Minimal joint is unremarkable with no fracture or contusion pattern. Mild subchondral cyst are seen in the posterior lateral humeral head  likely related to insertional tendinosis of the adjacent supraspinatus infraspinatus tendons. No subacromial or subdeltoid bursal collection is present.   Rotator cuff: There is mild insertional tendinosis of the supraspinatus tendon with mild bursal surface fraying. There is no significant partial or full thickness tear. Subscapularis and teres minor tendons are unremarkable.   Labrum and biceps tendon: Biceps tendon is intact. There is a nondisplaced posterior labral tear best seen on axial image 12 through 15 of series 3. There is a questionable chronic nondisplaced anterior labral tear with slight truncation of the anterior glenoid. Correlation for remote dislocation.  Patient-Specific Activity Scoring Scheme  0 represents "unable to perform." 10 represents "able to perform at prior level. 0 1 2 3 4 5 6 7 8 9  10 (Date and Score)   Activity Eval  03/04/2024    1.  Computer work  0    2. Playing drums 0     3. Handy man tasks 0   4.    5.    Score 0    Total score = sum of the activity scores/number of activities Minimum detectable change (90%CI) for average score = 2 points Minimum detectable change (90%CI) for single activity score = 3 points  COGNITION: 03/04/2024 Overall cognitive status: Within functional limits for tasks assessed     SENSATION: 03/04/2024 [facility]  POSTURE: 03/04/2024 Head forward, rounded shoulder,  UPPER EXTREMITY ROM:   ROM Right Eval 03/04/2024 Right 03/09/2024 PROM in supine Right 03/27/24  Supine  Right 04/03/24 Supine  Shoulder flexion Supine P: 52* 90 limited by protocol PROM 140*, AROM 100* AROM 135  Shoulder extension      Shoulder abduction Supine P: 41* 45 limited by protocol PROM 90*, AROM 70* AROM 105  Shoulder adduction      Shoulder internal rotation Supine To stomach Limited by protocol     Shoulder external rotation Supine 20*limited by protocol  PROM 20* at side, AROM 15* at side  AROM 40 at side  Elbow flexion       Elbow extension -5*     Wrist flexion      Wrist extension      Wrist ulnar deviation      Wrist radial deviation  Wrist pronation      Wrist supination      (Blank rows = not tested)  UPPER EXTREMITY MMT:  MMT Right Eval Not tested due to protocol Right 04/08/2024  Shoulder flexion  4+/5  Shoulder extension    Shoulder abduction  4/5  Shoulder adduction    Shoulder internal rotation  5/5  Shoulder external rotation  5/5  Middle trapezius    Lower trapezius    Elbow flexion    Elbow extension    Wrist flexion    Wrist extension    Wrist ulnar deviation    Wrist radial deviation    Wrist pronation    Wrist supination    Grip strength (lbs)    (Blank rows = not tested)  PALPATION:  03/04/2024: Pain with palpation to upper, middle & inferior trapezius muscles, pectoralis muscle, deltoid muscle, biceps muscle and ribs 4-8 under axilla.   EDEMA: 03/04/2024 Localized edema to right shoulder, scapula and axilla.                    TODAY'S TREATMENT:                                                                      DATE: 04/08/2024 Therex: UBE fwd/back 3 mins each way lvl 2.0  Standing shoulder flexion to 90 deg to abduction to side and reverse 2 x 10 - constant verbal cues required.     Neuro Re-ed (postural activation, movement control) Standing blue band rows c scapular retraction 2 x 15 Standing blue band high attachment GH ext to just past legs 2 x 15  Standing blue band ER walk outs Rt arm 5 sec hold x 10  Standing blue band ER with flexion punch 2 x 10 Rt  Prone Rt arm Y 1 lb 2 x 15 Prone Rt arm T 1 lb 2 x 15  Prone on elbows SA press 5 sec hold x 10 bilateral        TODAY'S TREATMENT:                                                                      DATE: 04/03/2024  Therapeutic Exercise: Pulleys seated AAROM flexion, scaption, horizontal shoulder abd x 2 minutes ea.  PT cued technique. Pt verbalized understanding.  Prone I 2x10 Prone row  2x10 Prone T 2x10 Prone Y 2x10 Sidelying ER AROM to <45 deg 2x10 Sidelying abd AROM 2x10 Isometric reactive red Thera-Band resistance for internal rotation, external rotation, row, flexion.  x10 reps first set Rechecked ROM    TREATMENT:  DATE:  04/01/2024  (5 weeks post op on 7/17) Therapeutic Exercise: Pulleys seated AAROM flexion & scaption 2 minutes ea.  PT cued technique. Pt verbalized understanding.  Active shoulder flexion & scaption moving RUE counter to shoulder level shelf 5 reps; counter to shoulder level to eye level shelf 5 reps;  counter to eye level (2nd shelf) 5 reps.  Prone I (arms by side) 5 reps with wand & 10 reps AROM Prone rows AROM 10 reps Prone T scapula AROM 10 reps Prone Y scapular active range of motion 10 reps Isometric reactive red Thera-Band resistance for internal rotation and external rotation.  10 reps first set & 5 reps second set.  PT demo how to perform at home safely. PT updated HEP including handout with the above exercises including demo and verbal cues on technique.  Patient verbalized understanding after performing in the clinic.    PATIENT EDUCATION: 03/09/2024 Education details: HEP update Person educated: Patient Education method: Explanation, Demonstration, Verbal cues, and Handouts Education comprehension: verbalized understanding, returned demonstration, and verbal cues required  HOME EXERCISE PROGRAM: Access Code: URL: [email or web address] Date: 04/01/2024 Prepared by: Grayce Spatz  Exercises - Seated Isometric Shoulder External Rotation with Towel  - 3-4 x daily - 7 x weekly - 1 sets - 10 reps - 5 seconds hold - Seated Isometric Shoulder Internal Rotation with Towel  - 3-4 x daily - 7 x weekly - 1 sets - 10 reps - 5 seconds hold - Seated Isometric Shoulder Flexion  - 3-4 x daily - 7 x weekly - 1 sets - 10  reps - 5 seconds hold - Standing Isometric Shoulder Extension with Doorway - Arm Bent  - 3-4 x daily - 7 x weekly - 1 sets - 10 reps - 5 seconds hold - Supine Chin Tuck  - 3-4 x daily - 7 x weekly - 1 sets - 10 reps - 5 seconds hold - Seated Scapular Retraction  - 3-5 x daily - 7 x weekly - 1 sets - 5 reps - 3-5 hold - Supine Shoulder Protraction with Dowel  - 1-2 x daily - 7 x weekly - 1 sets - 10 reps - 3-5 hold - Supine Shoulder Flexion AAROM with Dowel  - 1 x daily - 7 x weekly - 3 sets - 10 reps - Supine Shoulder Abduction AAROM with Dowel  - 1 x daily - 7 x weekly - 2-3 sets - 10 reps - Seated Shoulder External Rotation AAROM with Dowel  - 1 x daily - 7 x weekly - 2-3 sets - 10 reps - Prone Shoulder Extension  - 1 x daily - 7 x weekly - 1-2 sets - 10 reps - Seated Shoulder Flexion AAROM with Pulley Behind  - 1 x daily - 7 x weekly - 1 sets - 1 reps - 2-3 minutes hold - Seated Shoulder Scaption AAROM with Pulley at Side  - 1 x daily - 7 x weekly - 1 sets - 1 reps - 2-3 minutes hold - Prone Scapular Retraction  - 1 x daily - 7 x weekly - 2 sets - 10 reps - 5 seconds hold - Prone Scapular Retraction Arms at Side  - 1 x daily - 7 x weekly - 2 sets - 10 reps - 5 seconds hold - Prone Shoulder Row  - 1 x daily - 7 x weekly - 2 sets - 10 reps - 5 seconds hold - Prone Scapular Retraction Y  - 1 x daily -  7 x weekly - 2 sets - 10 reps - 5 seconds hold - Shoulder External Rotation Walkouts with Anchored Resistance  - 1 x daily - 7 x weekly - 2 sets - 10 reps - 5 seconds hold - Shoulder Internal Rotation Reactive Isometrics  - 1 x daily - 7 x weekly - 2 sets - 10 reps - 5 seconds hold   Active shoulder exercises at counter using shelves: Pretend holding a mug or cup so your hand and shoulders today in the proper position. Flexion -stand facing the counter with your right foot close to the counter on the left foot slightly back.  With each of the activities you are going to weight shift from the back  left leg to the front right leg while reaching up with the right hand and when lowering the hand to the counter you are going to shift back to the left leg. Set your posture then move the cup from the counter to the first shelf, reset your posture, lower the cup back to the counter. Do 10 reps Set your posture then moved the cup from the counter to the first shelf, reset your posture moved the cup from the far shelf to the second shelf, reset your posture made the cut back down to the far shelf, reset your posture and move the cup back to the counter.  Do 10 reps Set your posture then moved the cup from the counter to the second shelf in one motion, reset your posture and move the cup from the second shelf back to the counter.  Do 10 reps. 2.   Scaption -stand with your feet facing to the side with your right side beside the counter.  Your right foot should be positioned close to the counter and slightly forward compared to the left foot.  With each of the activities you are going to weight shift from the back leg to the front right leg while reaching up with the right hand and when lowering the hand back to the counter you will shift back to the left leg.  Your hand should say slightly in front of your shoulder. Set your posture then move the cup from the counter to the first shelf, reset your posture, lower the cup back to the counter. Do 10 reps Set your posture then moved the cup from the counter to the first shelf, reset your posture moved the cup from the far shelf to the second shelf, reset your posture made the cut back down to the far shelf, reset your posture and move the cup back to the counter.  Do 10 reps Set your posture then moved the cup from the counter to the second shelf in one motion, reset your posture and move the cup from the second shelf back to the counter.  Do 10 reps.    ASSESSMENT:  CLINICAL IMPRESSION: Overall good strength gains noted as well range of motion gains to this  point.  Continued progression indicated to continue to build functional strength in daily use.    OBJECTIVE IMPAIRMENTS: decreased activity tolerance, decreased endurance, decreased knowledge of condition, decreased ROM, decreased strength, increased edema, increased muscle spasms, impaired UE functional use, postural dysfunction, and pain.   ACTIVITY LIMITATIONS: carrying, lifting, sleeping, stairs, bathing, dressing, reach over head, hygiene/grooming, and computer for work.   PARTICIPATION LIMITATIONS: meal prep, laundry, driving, occupation, and yard work  PERSONAL FACTORS: Past/current experiences and 1-2 comorbidities: see PMH are also affecting patient's functional outcome.  REHAB POTENTIAL: Good  CLINICAL DECISION MAKING: Stable/uncomplicated  EVALUATION COMPLEXITY: Low   GOALS: Goals reviewed with patient? Yes  SHORT TERM GOALS: (target date for Short term goals 03/26/2024)  1.Patient will demonstrate independent use of home exercise program to maintain progress from in clinic treatments. Baseline: SEE OBJECTIVE DATA Goal status:  MET 03/27/2024  LONG TERM GOALS: (target dates for all long term goals 05/29/2024)   1. Patient will demonstrate/report pain at worst less than or equal to 2/10 to facilitate minimal limitation in daily activity secondary to pain symptoms. Baseline: SEE OBJECTIVE DATA Goal status:  Ongoing  04/01/2024   2. Patient will demonstrate independent use of home exercise program to facilitate ability to maintain/progress functional gains from skilled physical therapy services. Baseline: SEE OBJECTIVE DATA Goal status: Ongoing  04/01/2024   3.  Patient reports Patient-Specific Activity Score improved to >5 to indicate improvement in functional activities.  Baseline: SEE OBJECTIVE DATA Goal status: Ongoing  04/01/2024   4.  Patient will demonstrate right UE MMT 5/5 throughout to facilitate lifting, reaching, carrying at [facility] in daily activity.  Baseline:  SEE OBJECTIVE DATA Goal status: Ongoing  04/01/2024   5.  Patient will demonstrate right GH joint AROM WFL s symptoms to facilitate usual overhead reaching, self care, dressing at PLOF.  Baseline: SEE OBJECTIVE DATA Goal status: Ongoing  04/01/2024   6.  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  Baseline: SEE OBJECTIVE DATA Goal status: Ongoing  04/01/2024    PLAN: PT FREQUENCY: 1x/wk for 4 weeks, then 2-3x/wk for 8 weeks  PT DURATION: 12 weeks  PLANNED INTERVENTIONS: 97164- PT Re-evaluation, 97110-Therapeutic exercises, 97530- Therapeutic activity, 97112- Neuromuscular re-education, 97535- Self Care, [id number]- Manual therapy, [id number]- Electrical stimulation (unattended), [id number]- Electrical stimulation (manual), 97016- Vasopneumatic device, 20560 (1-2 muscles), 20561 (3+ muscles)- Dry Needling, Patient/Family education, Taping, Scar mobilization, Cryotherapy, and Moist heat  PLAN FOR NEXT SESSION: Follow up on MD visit.     Follow protocol in file box, Phase 3: 4-8 weeks (03/26/24 - 04/23/24)  AROM restrictions 160* FF, ER 45* at side, 160* ABD, IR behind back to waist level, no cross body ADD until 6 weeks (04/09/2024)  Ozell Silvan, PT, DPT, OCS, ATC 04/08/24  3:56 PM

## 2024-04-15 ENCOUNTER — Ambulatory Visit (HOSPITAL_BASED_OUTPATIENT_CLINIC_OR_DEPARTMENT_OTHER): Admitting: Orthopaedic Surgery

## 2024-04-15 ENCOUNTER — Encounter: Payer: Self-pay | Admitting: Rehabilitative and Restorative Service Providers"

## 2024-04-15 ENCOUNTER — Ambulatory Visit (INDEPENDENT_AMBULATORY_CARE_PROVIDER_SITE_OTHER): Admitting: Rehabilitative and Restorative Service Providers"

## 2024-04-15 DIAGNOSIS — R600 Localized edema: Secondary | ICD-10-CM | POA: Diagnosis not present

## 2024-04-15 DIAGNOSIS — G8929 Other chronic pain: Secondary | ICD-10-CM

## 2024-04-15 DIAGNOSIS — M25511 Pain in right shoulder: Secondary | ICD-10-CM | POA: Diagnosis not present

## 2024-04-15 DIAGNOSIS — M25611 Stiffness of right shoulder, not elsewhere classified: Secondary | ICD-10-CM

## 2024-04-15 DIAGNOSIS — M6281 Muscle weakness (generalized): Secondary | ICD-10-CM

## 2024-04-15 DIAGNOSIS — R293 Abnormal posture: Secondary | ICD-10-CM

## 2024-04-15 NOTE — Therapy (Signed)
 OUTPATIENT PHYSICAL THERAPY TREATMENT   Patient Name: Jordan Robles MRN: [id number] DOB:1981-03-06, 43 y.o., male Today's Date: 04/15/2024  END OF SESSION:  PT End of Session - 04/15/24 1606     Visit Number 7    Number of Visits 25    Date for PT Re-Evaluation 05/29/24    Authorization Type [employer] PPO    Authorization Time Period NO AUTH NEEDED  DED AND OOP NOT MET, PAY TOWARDS OOP, NO COPAY    PT Start Time 1601    PT Stop Time 1639    PT Time Calculation (min) 38 min    Activity Tolerance Patient tolerated treatment well    Behavior During Therapy WFL for tasks assessed/performed            Past Medical History:  Diagnosis Date   ADHD (attention deficit hyperactivity disorder)    Genital warts    GERD (gastroesophageal reflux disease)    Hyperlipidemia    Hypertension    Learning disability    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 Eda, 13 adenomatous polyps and 2 hyperplastic polyps. repeat in one yr   ESOPHAGOGASTRODUODENOSCOPY  07/18/2022   per Dr. Eda, reflux esophagitis, neg for H pylori. 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   HYPERLIPIDEMIA 05/15/2007   LEARNING DISABILITY 05/15/2007    PCP: Johnny Garnette LABOR, MD  REFERRING PROVIDER: Genelle Standing MD  REFERRING DIAG: [phone] (ICD-10-CM) - Chronic right  shoulder pain   THERAPY DIAG:  Muscle weakness (generalized)  Acute pain of right shoulder  Stiffness of right shoulder, not elsewhere classified  Localized edema  Abnormal posture  Rationale for Evaluation and Treatment: Rehabilitation  ONSET DATE: 02/27/2024 right shoulder labral sg repair  SUBJECTIVE:                                                                                                                                                                                      SUBJECTIVE STATEMENT: Pt indicated good MD visit reporting.  Stated no specific pain complaints today.  Hand dominance: Right  PERTINENT HISTORY: ADHD, GERD, HTN, HLD, testicular CA  PAIN:  NPRS scale: 0/10 Pain location: right shoulder anterior Pain description:  discomfort  Aggravating factors: dressing/lifting arm to dress, putting pressure on it/leaning Relieving factors: prescription - ibuprofen , tylenol  and ice  PRECAUTIONS: Shoulder see protocol in file box  RED FLAGS: None   WEIGHT BEARING RESTRICTIONS: unable to use right arm per protocol   FALLS:  Has patient fallen in last 6 months? Yes. 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   NEXT MD VISIT: 04/15/2024  OBJECTIVE:  Note: Objective measures were completed at Evaluation unless otherwise noted.  DIAGNOSTIC FINDINGS:  01/27/2024 MRI Findings Bones: Mild AC joint arthrosis. Minimal joint is unremarkable with no fracture or contusion pattern. Mild subchondral cyst are seen in the posterior  lateral humeral head likely related to insertional tendinosis of the adjacent supraspinatus infraspinatus tendons. No subacromial or subdeltoid bursal collection is present.   Rotator cuff: There is mild insertional tendinosis of the supraspinatus tendon with mild bursal surface fraying. There is no significant partial or full thickness tear. Subscapularis and teres minor tendons are unremarkable.   Labrum and biceps tendon: Biceps tendon is intact. There is a nondisplaced posterior labral tear best seen on axial image 12 through 15 of series 3. There is a questionable chronic nondisplaced anterior labral tear with slight truncation of the anterior glenoid. Correlation for remote dislocation.  Patient-Specific Activity Scoring Scheme  0 represents "unable to perform." 10 represents "able to perform at prior level. 0 1 2 3 4 5 6 7 8 9  10 (Date and Score)   Activity Eval  03/04/2024    1.  Computer work  0    2. Playing drums 0     3. Handy man tasks 0   4.    5.    Score 0    Total score = sum of the activity scores/number of activities Minimum detectable change (90%CI) for average score = 2 points Minimum detectable change (90%CI) for single activity score = 3 points  COGNITION: 03/04/2024 Overall cognitive status: Within functional limits for tasks assessed     SENSATION: 03/04/2024 [facility]  POSTURE: 03/04/2024 Head forward, rounded shoulder,  UPPER EXTREMITY ROM:   ROM Right Eval 03/04/2024 Right 03/09/2024 PROM in supine Right 03/27/24  Supine  Right 04/03/24 Supine Right 04/15/2024 AROM  Shoulder flexion Supine P: 52* 90 limited by protocol PROM 140*, AROM 100* AROM 135 150 in wall slide   Shoulder extension       Shoulder abduction Supine P: 41* 45 limited by protocol PROM 90*, AROM 70* AROM 105   Shoulder adduction       Shoulder internal rotation Supine To stomach Limited by protocol      Shoulder external rotation Supine 20*limited by protocol  PROM  20* at side, AROM 15* at side  AROM 40 at side AROM 45 deg  Elbow flexion       Elbow extension -5*      Wrist flexion       Wrist extension  Wrist ulnar deviation       Wrist radial deviation       Wrist pronation       Wrist supination       (Blank rows = not tested)  UPPER EXTREMITY MMT:  MMT Right Eval Not tested due to protocol Right 04/08/2024  Shoulder flexion  4+/5  Shoulder extension    Shoulder abduction  4/5  Shoulder adduction    Shoulder internal rotation  5/5  Shoulder external rotation  5/5  Middle trapezius    Lower trapezius    Elbow flexion    Elbow extension    Wrist flexion    Wrist extension    Wrist ulnar deviation    Wrist radial deviation    Wrist pronation    Wrist supination    Grip strength (lbs)    (Blank rows = not tested)  PALPATION:  03/04/2024: Pain with palpation to upper, middle & inferior trapezius muscles, pectoralis muscle, deltoid muscle, biceps muscle and ribs 4-8 under axilla.   EDEMA: 03/04/2024 Localized edema to right shoulder, scapula and axilla.                   TODAY'S TREATMENT:                                                                      DATE: 04/15/2024 Therex: Wall slide into flexion with lift off hold 2 seconds x15  (performed to tightness around 150 deg) Supine horizontal abduction blue band 2 x 10  Standing blue band IR c towel under arm Rt 2 x 15  Standing shoulder flexion superset 0-100 deg 1 lb weight  (x 10 together, x 10 one arm while holding other in 90 deg, then x 10 switched, then x 10 together) Standing shoulder scaption superset 0-100 deg 2 lb weight  (x 10 together, x 10 one arm while holding other in 90 deg, then x 10 switched, then x 10 together)   Neuro Re-ed (postural activation, movement control) Tband rows with scap retraction focus 2 x 15 blue band Standing blue band high attachment GH ext to just past legs 2 x 15  Standing ball wall circles 2 lb ball in 100 deg shoulder flexion  Rt 30 x 2 cw, ccw each Supine 90 deg stabilizations with mild resistance from clinician 30 sec  Standing Rt shoulder ER walk outs 5 sec hold x 12 with blue band    TODAY'S TREATMENT:                                                                      DATE: 04/08/2024 Therex: UBE fwd/back 3 mins each way lvl 2.0  Standing shoulder flexion to 90 deg to abduction to side and reverse 2 x 10 - constant verbal cues required.   Neuro Re-ed (postural activation, movement control) Standing blue band rows c scapular retraction 2 x 15 Standing blue band high attachment GH ext to just past legs 2 x 15  Standing blue band ER walk outs Rt arm 5 sec hold x 10  Standing blue band ER with flexion punch 2 x 10 Rt  Prone Rt arm Y 1 lb 2 x 15 Prone Rt arm T 1 lb 2 x 15  Prone on elbows SA press 5 sec hold x 10 bilateral   TODAY'S TREATMENT:                                                                      DATE: 04/03/2024  Therapeutic Exercise: Pulleys seated AAROM flexion, scaption, horizontal shoulder abd x 2 minutes ea.  PT cued technique. Pt verbalized understanding.  Prone I 2x10 Prone row 2x10 Prone T 2x10 Prone Y 2x10 Sidelying ER AROM to <45 deg 2x10 Sidelying abd AROM 2x10 Isometric reactive red Thera-Band resistance for internal rotation, external rotation, row, flexion.  x10 reps first set Rechecked ROM    TREATMENT:                                                                                                       DATE:  04/01/2024  (5 weeks post op on 7/17) Therapeutic Exercise: Pulleys seated AAROM flexion & scaption 2 minutes ea.  PT cued technique. Pt verbalized understanding.  Active shoulder flexion & scaption moving RUE counter to shoulder level shelf 5 reps; counter to shoulder level to eye level shelf 5 reps;  counter to eye level (2nd shelf) 5 reps.  Prone I (arms by side) 5 reps with wand & 10 reps AROM Prone rows AROM 10 reps Prone T scapula AROM 10 reps Prone Y  scapular active range of motion 10 reps Isometric reactive red Thera-Band resistance for internal rotation and external rotation.  10 reps first set & 5 reps second set.  PT demo how to perform at home safely. PT updated HEP including handout with the above exercises including demo and verbal cues on technique.  Patient verbalized understanding after performing in the clinic.    PATIENT EDUCATION: 03/09/2024 Education details: HEP update Person educated: Patient Education method: Explanation, Demonstration, Verbal cues, and Handouts Education comprehension: verbalized understanding, returned demonstration, and verbal cues required  HOME EXERCISE PROGRAM: Access Code: URL: [email or web address] Date: 04/01/2024 Prepared by: Grayce Spatz  Exercises - Seated Isometric Shoulder External Rotation with Towel  - 3-4 x daily - 7 x weekly - 1 sets - 10 reps - 5 seconds hold - Seated Isometric Shoulder Internal Rotation with Towel  - 3-4 x daily - 7 x weekly - 1 sets - 10 reps - 5 seconds hold - Seated Isometric Shoulder Flexion  - 3-4 x daily - 7 x weekly - 1 sets - 10 reps - 5 seconds hold - Standing Isometric Shoulder Extension with Doorway - Arm Bent  - 3-4 x daily - 7 x weekly -  1 sets - 10 reps - 5 seconds hold - Supine Chin Tuck  - 3-4 x daily - 7 x weekly - 1 sets - 10 reps - 5 seconds hold - Seated Scapular Retraction  - 3-5 x daily - 7 x weekly - 1 sets - 5 reps - 3-5 hold - Supine Shoulder Protraction with Dowel  - 1-2 x daily - 7 x weekly - 1 sets - 10 reps - 3-5 hold - Supine Shoulder Flexion AAROM with Dowel  - 1 x daily - 7 x weekly - 3 sets - 10 reps - Supine Shoulder Abduction AAROM with Dowel  - 1 x daily - 7 x weekly - 2-3 sets - 10 reps - Seated Shoulder External Rotation AAROM with Dowel  - 1 x daily - 7 x weekly - 2-3 sets - 10 reps - Prone Shoulder Extension  - 1 x daily - 7 x weekly - 1-2 sets - 10 reps - Seated Shoulder Flexion AAROM with Pulley  Behind  - 1 x daily - 7 x weekly - 1 sets - 1 reps - 2-3 minutes hold - Seated Shoulder Scaption AAROM with Pulley at Side  - 1 x daily - 7 x weekly - 1 sets - 1 reps - 2-3 minutes hold - Prone Scapular Retraction  - 1 x daily - 7 x weekly - 2 sets - 10 reps - 5 seconds hold - Prone Scapular Retraction Arms at Side  - 1 x daily - 7 x weekly - 2 sets - 10 reps - 5 seconds hold - Prone Shoulder Row  - 1 x daily - 7 x weekly - 2 sets - 10 reps - 5 seconds hold - Prone Scapular Retraction Y  - 1 x daily - 7 x weekly - 2 sets - 10 reps - 5 seconds hold - Shoulder External Rotation Walkouts with Anchored Resistance  - 1 x daily - 7 x weekly - 2 sets - 10 reps - 5 seconds hold - Shoulder Internal Rotation Reactive Isometrics  - 1 x daily - 7 x weekly - 2 sets - 10 reps - 5 seconds hold   Active shoulder exercises at counter using shelves: Pretend holding a mug or cup so your hand and shoulders today in the proper position. Flexion -stand facing the counter with your right foot close to the counter on the left foot slightly back.  With each of the activities you are going to weight shift from the back left leg to the front right leg while reaching up with the right hand and when lowering the hand to the counter you are going to shift back to the left leg. Set your posture then move the cup from the counter to the first shelf, reset your posture, lower the cup back to the counter. Do 10 reps Set your posture then moved the cup from the counter to the first shelf, reset your posture moved the cup from the far shelf to the second shelf, reset your posture made the cut back down to the far shelf, reset your posture and move the cup back to the counter.  Do 10 reps Set your posture then moved the cup from the counter to the second shelf in one motion, reset your posture and move the cup from the second shelf back to the counter.  Do 10 reps. 2.   Scaption -stand with your feet facing to the side with your right  side beside the counter.  Your right  foot should be positioned close to the counter and slightly forward compared to the left foot.  With each of the activities you are going to weight shift from the back leg to the front right leg while reaching up with the right hand and when lowering the hand back to the counter you will shift back to the left leg.  Your hand should say slightly in front of your shoulder. Set your posture then move the cup from the counter to the first shelf, reset your posture, lower the cup back to the counter. Do 10 reps Set your posture then moved the cup from the counter to the first shelf, reset your posture moved the cup from the far shelf to the second shelf, reset your posture made the cut back down to the far shelf, reset your posture and move the cup back to the counter.  Do 10 reps Set your posture then moved the cup from the counter to the second shelf in one motion, reset your posture and move the cup from the second shelf back to the counter.  Do 10 reps.    ASSESSMENT:  CLINICAL IMPRESSION: Progressed to allow cross body adduction per protocol.  Flexion end range limited compared to protocol guidance about about 10 degrees.   OBJECTIVE IMPAIRMENTS: decreased activity tolerance, decreased endurance, decreased knowledge of condition, decreased ROM, decreased strength, increased edema, increased muscle spasms, impaired UE functional use, postural dysfunction, and pain.   ACTIVITY LIMITATIONS: carrying, lifting, sleeping, stairs, bathing, dressing, reach over head, hygiene/grooming, and computer for work.   PARTICIPATION LIMITATIONS: meal prep, laundry, driving, occupation, and yard work  PERSONAL FACTORS: Past/current experiences and 1-2 comorbidities: see PMH are also affecting patient's functional outcome.   REHAB POTENTIAL: Good  CLINICAL DECISION MAKING: Stable/uncomplicated  EVALUATION COMPLEXITY: Low   GOALS: Goals reviewed with patient? Yes  SHORT  TERM GOALS: (target date for Short term goals 03/26/2024)  1.Patient will demonstrate independent use of home exercise program to maintain progress from in clinic treatments. Baseline: SEE OBJECTIVE DATA Goal status:  MET 03/27/2024  LONG TERM GOALS: (target dates for all long term goals 05/29/2024)   1. Patient will demonstrate/report pain at worst less than or equal to 2/10 to facilitate minimal limitation in daily activity secondary to pain symptoms. Baseline: SEE OBJECTIVE DATA Goal status:  Ongoing  04/01/2024   2. Patient will demonstrate independent use of home exercise program to facilitate ability to maintain/progress functional gains from skilled physical therapy services. Baseline: SEE OBJECTIVE DATA Goal status: Ongoing  04/01/2024   3.  Patient reports Patient-Specific Activity Score improved to >5 to indicate improvement in functional activities.  Baseline: SEE OBJECTIVE DATA Goal status: Ongoing  04/01/2024   4.  Patient will demonstrate right UE MMT 5/5 throughout to facilitate lifting, reaching, carrying at [facility] in daily activity.  Baseline: SEE OBJECTIVE DATA Goal status: Ongoing  04/01/2024   5.  Patient will demonstrate right GH joint AROM WFL s symptoms to facilitate usual overhead reaching, self care, dressing at PLOF.  Baseline: SEE OBJECTIVE DATA Goal status: Ongoing  04/01/2024   6.  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  Baseline: SEE OBJECTIVE DATA Goal status: Ongoing  04/01/2024    PLAN: PT FREQUENCY: 1x/wk for 4 weeks, then 2-3x/wk for 8 weeks  PT DURATION: 12 weeks  PLANNED INTERVENTIONS: 97164- PT Re-evaluation, 97110-Therapeutic exercises, 97530- Therapeutic activity, 97112- Neuromuscular re-education, 97535- Self Care, 02859- Manual therapy, [id number]- Electrical stimulation (  unattended), [phone]- Electrical stimulation (manual), 02983- Vasopneumatic device, [id number] (1-2 muscles), 20561 (3+ muscles)- Dry Needling,  Patient/Family education, Taping, Scar mobilization, Cryotherapy, and Moist heat  PLAN FOR NEXT SESSION: Follow up on MD visit (note not ready today).  Progressive strengthening within range allowed.     Follow protocol in file box, Phase 3: 4-8 weeks (03/26/24 - 04/23/24)  AROM restrictions 160* FF, ER 45* at side, 160* ABD, IR behind back to waist level until 04/23/2024 - 8 weeks with protocol.   Ozell Silvan, PT, DPT, OCS, ATC 04/15/24  4:39 PM

## 2024-04-15 NOTE — Progress Notes (Signed)
 Post Operative Evaluation    Procedure/Date of Surgery: Right shoulder arthroscopy with labral repair 6/10  Interval History:   Presents 6 weeks after above procedure.  Overall he is doing extremely well.  Denies any instability feelings.   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 Eda, 13 adenomatous polyps and 2 hyperplastic polyps. repeat in one yr   ESOPHAGOGASTRODUODENOSCOPY  07/18/2022   per Dr. Eda, reflux esophagitis, neg for H pylori. 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   VASECTOMY     Social History   Socioeconomic History   Marital status: Married    Spouse name: Not on file   Number of children: Not on file   Years of education: Not on file   Highest education level: Master's degree (e.g., MA, MS, MEng, MEd, MSW, MBA)  Occupational History   Not on file  Tobacco Use   Smoking status: Former    Current packs/day: 0.00    Types: Cigarettes    Quit date: 07/18/2017    Years since quitting: 6.7   Smokeless tobacco: Never   Tobacco comments:    5 CIGARETTES DAILY   Vaping Use   Vaping status: Never Used  Substance and Sexual Activity   Alcohol use: Yes    Alcohol/week: 4.0 standard drinks of alcohol    Types: 4 Cans of beer per week   Drug use: No   Sexual activity: Not on file  Other Topics Concern   Not on file  Social History Narrative   Not on file   Social Drivers of Health   Financial Resource Strain: Medium Risk (10/14/2023)   Overall Financial Resource Strain (CARDIA)    Difficulty of Paying Living Expenses:  Somewhat hard  Food Insecurity: No Food Insecurity (10/14/2023)   Hunger Vital Sign    Worried About Running Out of Food in the Last Year: Never true    Ran Out of Food in the Last Year: Never true  Transportation Needs: No Transportation Needs (10/14/2023)   PRAPARE - Administrator, Civil Service (Medical): No    Lack of Transportation (Non-Medical): No  Physical Activity: Insufficiently Active (10/14/2023)   Exercise Vital Sign    Days of Exercise per Week: 4 days    Minutes of Exercise per Session: 20 min  Stress: Stress Concern Present (10/14/2023)   [employer] of Occupational Health - Occupational Stress Questionnaire    Feeling of Stress : To some extent  Social Connections: Moderately Integrated (10/14/2023)   Social Connection and Isolation Panel    Frequency of Communication with Friends and Family: More than three times a week    Frequency of Social Gatherings with Friends and Family: Three times a week    Attends Religious Services: Never    Active Member of Clubs or Organizations: Yes    Attends Banker Meetings: 1 to 4 times per year    Marital Status:  Married   Family History  Problem Relation Age of Onset   Cancer Sister 8       rare tumor on her chest   Lung cancer Maternal Grandfather        smoked   Ovarian cancer Paternal Grandmother    Hyperlipidemia Other    Hypertension Other    Breast cancer Cousin        paternal first cousin   Leukemia Half-Brother 7       paternal half-brother   Colon cancer Neg Hx    Rectal cancer Neg Hx    Stomach cancer Neg Hx    Esophageal cancer Neg Hx    Allergies  Allergen Reactions   Doxycycline Hives    hives   Current Outpatient Medications  Medication Sig Dispense Refill   ALPRAZolam  (XANAX ) 0.5 MG tablet TAKE 1 TABLET(0.5 MG) BY MOUTH TWICE DAILY AS NEEDED FOR ANXIETY 60 tablet 5   AMBULATORY NON FORMULARY MEDICATION Medication Name: Digestive enzymes Probiotics, 3 gummies daily      amphetamine -dextroamphetamine  (ADDERALL) 10 MG tablet Take 1 tablet (10 mg total) by mouth 2 (two) times daily. 60 tablet 0   amphetamine -dextroamphetamine  (ADDERALL) 10 MG tablet Take 1 tablet (10 mg total) by mouth 2 (two) times daily. 60 tablet 0   amphetamine -dextroamphetamine  (ADDERALL) 10 MG tablet Take 1 tablet (10 mg total) by mouth 2 (two) times daily. 60 tablet 0   atorvastatin  (LIPITOR) 40 MG tablet Take 1 tablet (40 mg total) by mouth daily. 90 tablet 3   fenofibrate  160 MG tablet TAKE 1 TABLET(160 MG) BY MOUTH DAILY 90 tablet 3   fluticasone  (FLONASE ) 50 MCG/ACT nasal spray SHAKE LIQUID AND USE 2 SPRAYS IN EACH NOSTRIL DAILY 16 g 11   levocetirizine (XYZAL ) 5 MG tablet TAKE 1 TABLET(5 MG) BY MOUTH EVERY EVENING 30 tablet 11   losartan  (COZAAR ) 50 MG tablet TAKE 1 TABLET(50 MG) BY MOUTH DAILY 30 tablet 2   MAGNESIUM PO Take 250 mg by mouth daily at 12 noon.     metoprolol  succinate (TOPROL -XL) 100 MG 24 hr tablet Take 1 tablet (100 mg total) by mouth in the morning and at bedtime. Take with or immediately following a meal. 180 tablet 3   niacin  (NIASPAN ) 1000 MG CR tablet Take 1 tablet (1,000 mg total) by mouth at bedtime. 90 tablet 3   Omega-3 Fatty Acids (FISH OIL PO) Take by mouth daily.     pantoprazole  (PROTONIX ) 20 MG tablet Use Protonix  20 mg by mouth twice a day for 90 days #90 with 3 refills 90 tablet 3   sertraline  (ZOLOFT ) 50 MG tablet TAKE 1 TABLET(50 MG) BY MOUTH DAILY 90 tablet 3   temazepam  (RESTORIL ) 30 MG capsule TAKE 1 CAPSULE(30 MG) BY MOUTH AT BEDTIME 30 capsule 5   No current facility-administered medications for this visit.   No results found.  Review of Systems:   A ROS was performed including pertinent positives and negatives as documented in the HPI.   Musculoskeletal Exam:    There were no vitals taken for this visit.  Right shoulder incisions are well-appearing without erythema or drainage.  In the supine position he can forward elevate to 90 degrees  with external rotation of the side to 30 degrees.  Distal neurosensory exam is intact  Imaging:      I personally reviewed and interpreted the radiographs.   Assessment:   6 weeks status post right shoulder arthroscopic labral repair overall doing extremely well.  At  this time we will continue to work through the protocol and he may begin some strengthening at this time.  I will plan to see him back in 6 weeks for reassessment  Plan :    - Return to clinic 6 weeks for reassessment      I personally saw and evaluated the patient, and participated in the management and treatment plan.  Elspeth Parker, MD Attending Physician, Orthopedic Surgery  This document was dictated using Dragon voice recognition software. A reasonable attempt at proof reading has been made to minimize errors.

## 2024-04-16 NOTE — Therapy (Signed)
 OUTPATIENT PHYSICAL THERAPY TREATMENT   Patient Name: Jordan Robles MRN: [id number] DOB:1981-08-16, 43 y.o., male Today's Date: 04/17/2024  END OF SESSION:  PT End of Session - 04/17/24 1301     Visit Number 8    Number of Visits 25    Date for PT Re-Evaluation 05/29/24    Authorization Type [employer] PPO    Authorization Time Period NO AUTH NEEDED  DED AND OOP NOT MET, PAY TOWARDS OOP, NO COPAY    PT Start Time 1301    PT Stop Time 1345    PT Time Calculation (min) 44 min    Activity Tolerance Patient tolerated treatment well    Behavior During Therapy WFL for tasks assessed/performed             Past Medical History:  Diagnosis Date   ADHD (attention deficit hyperactivity disorder)    Genital warts    GERD (gastroesophageal reflux disease)    Hyperlipidemia    Hypertension    Learning disability    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 Eda, 13 adenomatous polyps and 2 hyperplastic polyps. repeat in one yr   ESOPHAGOGASTRODUODENOSCOPY  07/18/2022   per Dr. Eda, reflux esophagitis, neg for H pylori. 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   HYPERLIPIDEMIA 05/15/2007   LEARNING DISABILITY 05/15/2007    PCP: Johnny Garnette LABOR, MD  REFERRING PROVIDER: Genelle Standing MD  REFERRING DIAG: [phone] (ICD-10-CM) - Chronic right  shoulder pain   THERAPY DIAG:  Muscle weakness (generalized)  Acute pain of right shoulder  Stiffness of right shoulder, not elsewhere classified  Abnormal posture  Localized edema  Rationale for Evaluation and Treatment: Rehabilitation  ONSET DATE: 02/27/2024 right shoulder labral sg repair  SUBJECTIVE:                                                                                                                                                                                      SUBJECTIVE STATEMENT: Patient noting minor muscle soreness this date, but no shoulder pain.  Hand  dominance: Right  PERTINENT HISTORY: ADHD, GERD, HTN, HLD, testicular CA  PAIN:  NPRS scale: 0/10 Pain location: right shoulder anterior Pain description:  discomfort  Aggravating factors: dressing/lifting arm to dress, putting pressure on it/leaning Relieving factors: prescription - ibuprofen , tylenol  and ice  PRECAUTIONS: Shoulder see protocol in file box  RED FLAGS: None   WEIGHT BEARING RESTRICTIONS: unable to use right arm per protocol   FALLS:  Has patient fallen in last 6 months? Yes. 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   NEXT MD VISIT: 04/15/2024  OBJECTIVE:  Note: Objective measures were completed at Evaluation unless otherwise noted.  DIAGNOSTIC FINDINGS:  01/27/2024 MRI Findings Bones: Mild AC joint arthrosis. Minimal joint is unremarkable with no fracture or contusion pattern. Mild subchondral cyst are seen in the posterior lateral humeral head  likely related to insertional tendinosis of the adjacent supraspinatus infraspinatus tendons. No subacromial or subdeltoid bursal collection is present.   Rotator cuff: There is mild insertional tendinosis of the supraspinatus tendon with mild bursal surface fraying. There is no significant partial or full thickness tear. Subscapularis and teres minor tendons are unremarkable.   Labrum and biceps tendon: Biceps tendon is intact. There is a nondisplaced posterior labral tear best seen on axial image 12 through 15 of series 3. There is a questionable chronic nondisplaced anterior labral tear with slight truncation of the anterior glenoid. Correlation for remote dislocation.  Patient-Specific Activity Scoring Scheme  0 represents "unable to perform." 10 represents "able to perform at prior level. 0 1 2 3 4 5 6 7 8 9  10 (Date and Score)   Activity Eval  03/04/2024    1.  Computer work  0    2. Playing drums 0     3. Handy man tasks 0   4.    5.    Score 0    Total score = sum of the activity scores/number of activities Minimum detectable change (90%CI) for average score = 2 points Minimum detectable change (90%CI) for single activity score = 3 points  COGNITION: 03/04/2024 Overall cognitive status: Within functional limits for tasks assessed     SENSATION: 03/04/2024 [facility]  POSTURE: 03/04/2024 Head forward, rounded shoulder,  UPPER EXTREMITY ROM:   ROM Right Eval 03/04/2024 Right 03/09/2024 PROM in supine Right 03/27/24  Supine  Right 04/03/24 Supine Right 04/15/2024 AROM  Shoulder flexion Supine P: 52* 90 limited by protocol PROM 140*, AROM 100* AROM 135 150 in wall slide   Shoulder extension       Shoulder abduction Supine P: 41* 45 limited by protocol PROM 90*, AROM 70* AROM 105   Shoulder adduction       Shoulder internal rotation Supine To stomach Limited by protocol      Shoulder external rotation Supine 20*limited by protocol  PROM 20* at side, AROM 15*  at side  AROM 40 at side AROM 45 deg  Elbow flexion       Elbow extension -5*      Wrist flexion       Wrist extension  Wrist ulnar deviation       Wrist radial deviation       Wrist pronation       Wrist supination       (Blank rows = not tested)  UPPER EXTREMITY MMT:  MMT Right Eval Not tested due to protocol Right 04/08/2024  Shoulder flexion  4+/5  Shoulder extension    Shoulder abduction  4/5  Shoulder adduction    Shoulder internal rotation  5/5  Shoulder external rotation  5/5  Middle trapezius    Lower trapezius    Elbow flexion    Elbow extension    Wrist flexion    Wrist extension    Wrist ulnar deviation    Wrist radial deviation    Wrist pronation    Wrist supination    Grip strength (lbs)    (Blank rows = not tested)  PALPATION:  03/04/2024: Pain with palpation to upper, middle & inferior trapezius muscles, pectoralis muscle, deltoid muscle, biceps muscle and ribs 4-8 under axilla.   EDEMA: 03/04/2024 Localized edema to right shoulder, scapula and axilla.                   TODAY'S TREATMENT:                                                                      04/17/2024 TherEx:  Wall slide into flexion with lift off x20 with 2s hold, able to reach 155deg  Standing IR with blue TB and towel under arm 2x15  Standing shoulder flexion with superset 0-100deg with 2# weight (10 together, 10 with one arm up at 90, switch, then 10 together)  Standing shoulder scaption with 2# weight 2x10 Standing shoulder abduction with 2# weight 2x10   Neuro Re-Ed:  Standing ball wall alphabet 2# ball x2  Standing straight arm lat pulldown with blue TB 2x15 Standing rows with blue TB 2x15  Standing ER walkouts with blue TB using towel under arm 2x15 with 3-5s Body blade at 90deg shoulder flexion 2x30s with blade vertical, 2x30s with blade horizontal    DATE: 04/15/2024 Therex: Wall slide into flexion with lift off hold 2 seconds x15  (performed to tightness around  150 deg) Supine horizontal abduction blue band 2 x 10  Standing blue band IR c towel under arm Rt 2 x 15  Standing shoulder flexion superset 0-100 deg 1 lb weight  (x 10 together, x 10 one arm while holding other in 90 deg, then x 10 switched, then x 10 together) Standing shoulder scaption superset 0-100 deg 2 lb weight  (x 10 together, x 10 one arm while holding other in 90 deg, then x 10 switched, then x 10 together)   Neuro Re-ed (postural activation, movement control) Tband rows with scap retraction focus 2 x 15 blue band Standing blue band high attachment GH ext to just past legs 2 x 15  Standing ball wall circles 2 lb ball in 100 deg shoulder flexion Rt 30 x 2 cw, ccw each Supine 90 deg stabilizations with mild resistance from clinician 30 sec  Standing Rt shoulder ER walk outs 5 sec hold x 12 with blue band    TODAY'S TREATMENT:  DATE: 04/08/2024 Therex: UBE fwd/back 3 mins each way lvl 2.0  Standing shoulder flexion to 90 deg to abduction to side and reverse 2 x 10 - constant verbal cues required.   Neuro Re-ed (postural activation, movement control) Standing blue band rows c scapular retraction 2 x 15 Standing blue band high attachment GH ext to just past legs 2 x 15  Standing blue band ER walk outs Rt arm 5 sec hold x 10  Standing blue band ER with flexion punch 2 x 10 Rt  Prone Rt arm Y 1 lb 2 x 15 Prone Rt arm T 1 lb 2 x 15  Prone on elbows SA press 5 sec hold x 10 bilateral   TODAY'S TREATMENT:                                                                      DATE: 04/03/2024  Therapeutic Exercise: Pulleys seated AAROM flexion, scaption, horizontal shoulder abd x 2 minutes ea.  PT cued technique. Pt verbalized understanding.  Prone I 2x10 Prone row 2x10 Prone T 2x10 Prone Y 2x10 Sidelying ER AROM to <45 deg 2x10 Sidelying abd AROM 2x10 Isometric reactive red Thera-Band resistance for internal  rotation, external rotation, row, flexion.  x10 reps first set Rechecked ROM    TREATMENT:                                                                                                       DATE:  04/01/2024  (5 weeks post op on 7/17) Therapeutic Exercise: Pulleys seated AAROM flexion & scaption 2 minutes ea.  PT cued technique. Pt verbalized understanding.  Active shoulder flexion & scaption moving RUE counter to shoulder level shelf 5 reps; counter to shoulder level to eye level shelf 5 reps;  counter to eye level (2nd shelf) 5 reps.  Prone I (arms by side) 5 reps with wand & 10 reps AROM Prone rows AROM 10 reps Prone T scapula AROM 10 reps Prone Y scapular active range of motion 10 reps Isometric reactive red Thera-Band resistance for internal rotation and external rotation.  10 reps first set & 5 reps second set.  PT demo how to perform at home safely. PT updated HEP including handout with the above exercises including demo and verbal cues on technique.  Patient verbalized understanding after performing in the clinic.    PATIENT EDUCATION: 03/09/2024 Education details: HEP update Person educated: Patient Education method: Explanation, Demonstration, Verbal cues, and Handouts Education comprehension: verbalized understanding, returned demonstration, and verbal cues required  HOME EXERCISE PROGRAM: Access Code: URL: [email or web address] Date: 04/01/2024 Prepared by: Grayce Spatz  Exercises - Seated Isometric Shoulder External Rotation with Towel  - 3-4 x daily - 7 x weekly - 1 sets - 10 reps - 5 seconds hold - Seated Isometric Shoulder Internal Rotation  with Towel  - 3-4 x daily - 7 x weekly - 1 sets - 10 reps - 5 seconds hold - Seated Isometric Shoulder Flexion  - 3-4 x daily - 7 x weekly - 1 sets - 10 reps - 5 seconds hold - Standing Isometric Shoulder Extension with Doorway - Arm Bent  - 3-4 x daily - 7 x weekly - 1 sets - 10 reps - 5 seconds hold -  Supine Chin Tuck  - 3-4 x daily - 7 x weekly - 1 sets - 10 reps - 5 seconds hold - Seated Scapular Retraction  - 3-5 x daily - 7 x weekly - 1 sets - 5 reps - 3-5 hold - Supine Shoulder Protraction with Dowel  - 1-2 x daily - 7 x weekly - 1 sets - 10 reps - 3-5 hold - Supine Shoulder Flexion AAROM with Dowel  - 1 x daily - 7 x weekly - 3 sets - 10 reps - Supine Shoulder Abduction AAROM with Dowel  - 1 x daily - 7 x weekly - 2-3 sets - 10 reps - Seated Shoulder External Rotation AAROM with Dowel  - 1 x daily - 7 x weekly - 2-3 sets - 10 reps - Prone Shoulder Extension  - 1 x daily - 7 x weekly - 1-2 sets - 10 reps - Seated Shoulder Flexion AAROM with Pulley Behind  - 1 x daily - 7 x weekly - 1 sets - 1 reps - 2-3 minutes hold - Seated Shoulder Scaption AAROM with Pulley at Side  - 1 x daily - 7 x weekly - 1 sets - 1 reps - 2-3 minutes hold - Prone Scapular Retraction  - 1 x daily - 7 x weekly - 2 sets - 10 reps - 5 seconds hold - Prone Scapular Retraction Arms at Side  - 1 x daily - 7 x weekly - 2 sets - 10 reps - 5 seconds hold - Prone Shoulder Row  - 1 x daily - 7 x weekly - 2 sets - 10 reps - 5 seconds hold - Prone Scapular Retraction Y  - 1 x daily - 7 x weekly - 2 sets - 10 reps - 5 seconds hold - Shoulder External Rotation Walkouts with Anchored Resistance  - 1 x daily - 7 x weekly - 2 sets - 10 reps - 5 seconds hold - Shoulder Internal Rotation Reactive Isometrics  - 1 x daily - 7 x weekly - 2 sets - 10 reps - 5 seconds hold   Active shoulder exercises at counter using shelves: Pretend holding a mug or cup so your hand and shoulders today in the proper position. Flexion -stand facing the counter with your right foot close to the counter on the left foot slightly back.  With each of the activities you are going to weight shift from the back left leg to the front right leg while reaching up with the right hand and when lowering the hand to the counter you are going to shift back to the left  leg. Set your posture then move the cup from the counter to the first shelf, reset your posture, lower the cup back to the counter. Do 10 reps Set your posture then moved the cup from the counter to the first shelf, reset your posture moved the cup from the far shelf to the second shelf, reset your posture made the cut back down to the far shelf, reset your posture and move the cup back  to the counter.  Do 10 reps Set your posture then moved the cup from the counter to the second shelf in one motion, reset your posture and move the cup from the second shelf back to the counter.  Do 10 reps. 2.   Scaption -stand with your feet facing to the side with your right side beside the counter.  Your right foot should be positioned close to the counter and slightly forward compared to the left foot.  With each of the activities you are going to weight shift from the back leg to the front right leg while reaching up with the right hand and when lowering the hand back to the counter you will shift back to the left leg.  Your hand should say slightly in front of your shoulder. Set your posture then move the cup from the counter to the first shelf, reset your posture, lower the cup back to the counter. Do 10 reps Set your posture then moved the cup from the counter to the first shelf, reset your posture moved the cup from the far shelf to the second shelf, reset your posture made the cut back down to the far shelf, reset your posture and move the cup back to the counter.  Do 10 reps Set your posture then moved the cup from the counter to the second shelf in one motion, reset your posture and move the cup from the second shelf back to the counter.  Do 10 reps.    ASSESSMENT:  CLINICAL IMPRESSION: Patient arrived to session noting minimal muscle soreness with no complaints of pain. Patient tolerated all activities well this date with ability to reach 155deg with right forward shoulder flexion following wall slides.  Patient will continue to benefit from skilled PT.    OBJECTIVE IMPAIRMENTS: decreased activity tolerance, decreased endurance, decreased knowledge of condition, decreased ROM, decreased strength, increased edema, increased muscle spasms, impaired UE functional use, postural dysfunction, and pain.   ACTIVITY LIMITATIONS: carrying, lifting, sleeping, stairs, bathing, dressing, reach over head, hygiene/grooming, and computer for work.   PARTICIPATION LIMITATIONS: meal prep, laundry, driving, occupation, and yard work  PERSONAL FACTORS: Past/current experiences and 1-2 comorbidities: see PMH are also affecting patient's functional outcome.   REHAB POTENTIAL: Good  CLINICAL DECISION MAKING: Stable/uncomplicated  EVALUATION COMPLEXITY: Low   GOALS: Goals reviewed with patient? Yes  SHORT TERM GOALS: (target date for Short term goals 03/26/2024)  1.Patient will demonstrate independent use of home exercise program to maintain progress from in clinic treatments. Baseline: SEE OBJECTIVE DATA Goal status:  MET 03/27/2024  LONG TERM GOALS: (target dates for all long term goals 05/29/2024)   1. Patient will demonstrate/report pain at worst less than or equal to 2/10 to facilitate minimal limitation in daily activity secondary to pain symptoms. Baseline: SEE OBJECTIVE DATA Goal status:  Ongoing  04/01/2024   2. Patient will demonstrate independent use of home exercise program to facilitate ability to maintain/progress functional gains from skilled physical therapy services. Baseline: SEE OBJECTIVE DATA Goal status: Ongoing  04/01/2024   3.  Patient reports Patient-Specific Activity Score improved to >5 to indicate improvement in functional activities.  Baseline: SEE OBJECTIVE DATA Goal status: Ongoing  04/01/2024   4.  Patient will demonstrate right UE MMT 5/5 throughout to facilitate lifting, reaching, carrying at [facility] in daily activity.  Baseline: SEE OBJECTIVE DATA Goal status: Ongoing   04/01/2024   5.  Patient will demonstrate right GH joint AROM WFL s symptoms to facilitate usual overhead  reaching, self care, dressing at PLOF.  Baseline: SEE OBJECTIVE DATA Goal status: Ongoing  04/01/2024   6.  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  Baseline: SEE OBJECTIVE DATA Goal status: Ongoing  04/01/2024    PLAN: PT FREQUENCY: 1x/wk for 4 weeks, then 2-3x/wk for 8 weeks  PT DURATION: 12 weeks  PLANNED INTERVENTIONS: 97164- PT Re-evaluation, 97110-Therapeutic exercises, 97530- Therapeutic activity, 97112- Neuromuscular re-education, 97535- Self Care, [id number]- Manual therapy, [id number]- Electrical stimulation (unattended), [id number]- Electrical stimulation (manual), 97016- Vasopneumatic device, 20560 (1-2 muscles), 20561 (3+ muscles)- Dry Needling, Patient/Family education, Taping, Scar mobilization, Cryotherapy, and Moist heat  PLAN FOR NEXT SESSION: Review next phase of protocol with patient. Progressive strengthening within range allowed.     Follow protocol in file box, Phase 3: 4-8 weeks (03/26/24 - 04/23/24)  AROM restrictions 160* FF, ER 45* at side, 160* ABD, IR behind back to waist level until 04/23/2024 - 8 weeks with protocol.   Susannah Daring, PT, DPT 04/17/24 2:49 PM

## 2024-04-17 ENCOUNTER — Ambulatory Visit

## 2024-04-17 DIAGNOSIS — M6281 Muscle weakness (generalized): Secondary | ICD-10-CM | POA: Diagnosis not present

## 2024-04-17 DIAGNOSIS — M25511 Pain in right shoulder: Secondary | ICD-10-CM | POA: Diagnosis not present

## 2024-04-17 DIAGNOSIS — M25611 Stiffness of right shoulder, not elsewhere classified: Secondary | ICD-10-CM

## 2024-04-17 DIAGNOSIS — R600 Localized edema: Secondary | ICD-10-CM

## 2024-04-17 DIAGNOSIS — R293 Abnormal posture: Secondary | ICD-10-CM | POA: Diagnosis not present

## 2024-04-21 ENCOUNTER — Encounter: Admitting: Rehabilitative and Restorative Service Providers"

## 2024-04-23 ENCOUNTER — Encounter: Payer: Self-pay | Admitting: Family Medicine

## 2024-04-23 ENCOUNTER — Encounter: Payer: Self-pay | Admitting: Rehabilitative and Restorative Service Providers"

## 2024-04-23 ENCOUNTER — Ambulatory Visit (INDEPENDENT_AMBULATORY_CARE_PROVIDER_SITE_OTHER): Admitting: Rehabilitative and Restorative Service Providers"

## 2024-04-23 DIAGNOSIS — M25511 Pain in right shoulder: Secondary | ICD-10-CM | POA: Diagnosis not present

## 2024-04-23 DIAGNOSIS — R600 Localized edema: Secondary | ICD-10-CM

## 2024-04-23 DIAGNOSIS — M25611 Stiffness of right shoulder, not elsewhere classified: Secondary | ICD-10-CM | POA: Diagnosis not present

## 2024-04-23 DIAGNOSIS — M6281 Muscle weakness (generalized): Secondary | ICD-10-CM

## 2024-04-23 DIAGNOSIS — R293 Abnormal posture: Secondary | ICD-10-CM

## 2024-04-23 NOTE — Therapy (Signed)
 OUTPATIENT PHYSICAL THERAPY TREATMENT   Patient Name: Jordan Robles MRN: [id number] DOB:Sep 02, 1981, 43 y.o., male Today's Date: 04/23/2024  END OF SESSION:  PT End of Session - 04/23/24 1556     Visit Number 9    Number of Visits 25    Date for PT Re-Evaluation 05/29/24    Authorization Type [employer] PPO    Authorization Time Period NO AUTH NEEDED  DED AND OOP NOT MET, PAY TOWARDS OOP, NO COPAY    PT Start Time 1555    PT Stop Time 1634    PT Time Calculation (min) 39 min    Activity Tolerance Patient tolerated treatment well    Behavior During Therapy WFL for tasks assessed/performed              Past Medical History:  Diagnosis Date   ADHD (attention deficit hyperactivity disorder)    Genital warts    GERD (gastroesophageal reflux disease)    Hyperlipidemia    Hypertension    Learning disability    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 Eda, 13 adenomatous polyps and 2 hyperplastic polyps. repeat in one yr   ESOPHAGOGASTRODUODENOSCOPY  07/18/2022   per Dr. Eda, reflux esophagitis, neg for H pylori. 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   HYPERLIPIDEMIA 05/15/2007   LEARNING DISABILITY 05/15/2007    PCP: Johnny Garnette LABOR, MD  REFERRING PROVIDER: Genelle Standing MD  REFERRING DIAG: [phone] (ICD-10-CM) - Chronic right  shoulder pain   THERAPY DIAG:  Muscle weakness (generalized)  Acute pain of right shoulder  Stiffness of right shoulder, not elsewhere classified  Abnormal posture  Localized edema  Rationale for Evaluation and Treatment: Rehabilitation  ONSET DATE: 02/27/2024 right shoulder labral sg repair  SUBJECTIVE:                                                                                                                                                                                      SUBJECTIVE STATEMENT: Patient noting minor muscle soreness this date, but no shoulder pain.  Hand dominance: Right  PERTINENT HISTORY: ADHD, GERD, HTN, HLD, testicular CA  PAIN:  NPRS scale: 0/10 Pain location: right shoulder anterior Pain description:  discomfort  Aggravating factors: dressing/lifting arm to dress, putting pressure on it/leaning Relieving factors: prescription - ibuprofen , tylenol  and ice  PRECAUTIONS: Shoulder see protocol in file box  RED FLAGS: None   WEIGHT BEARING RESTRICTIONS: unable to use right arm per protocol   FALLS:  Has patient fallen in last 6 months? Yes. 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   NEXT MD VISIT: 04/15/2024  OBJECTIVE:  Note: Objective measures were completed at Evaluation unless otherwise noted.  DIAGNOSTIC FINDINGS:  01/27/2024 MRI Findings Bones: Mild AC joint arthrosis. Minimal joint is unremarkable with no fracture or contusion pattern. Mild subchondral cyst are seen in the posterior lateral humeral head  likely related to insertional tendinosis of the adjacent supraspinatus infraspinatus tendons. No subacromial or subdeltoid bursal collection is present.   Rotator cuff: There is mild insertional tendinosis of the supraspinatus tendon with mild bursal surface fraying. There is no significant partial or full thickness tear. Subscapularis and teres minor tendons are unremarkable.   Labrum and biceps tendon: Biceps tendon is intact. There is a nondisplaced posterior labral tear best seen on axial image 12 through 15 of series 3. There is a questionable chronic nondisplaced anterior labral tear with slight truncation of the anterior glenoid. Correlation for remote dislocation.  Patient-Specific Activity Scoring Scheme  0 represents "unable to perform." 10 represents "able to perform at prior level. 0 1 2 3 4 5 6 7 8 9  10 (Date and Score)   Activity Eval  03/04/2024 04/23/2024    1.  Computer work  0  10  2. Playing drums 0   7  3. Handy man tasks 0 7  4.    5.    Score 0 8 avg   Total score = sum of the activity scores/number of activities Minimum detectable change (90%CI) for average score = 2 points Minimum detectable change (90%CI) for single activity score = 3 points  COGNITION: 03/04/2024 Overall cognitive status: Within functional limits for tasks assessed     SENSATION: 03/04/2024 [facility]  POSTURE: 03/04/2024 Head forward, rounded shoulder,  UPPER EXTREMITY ROM:   ROM Right Eval 03/04/2024 Right 03/09/2024 PROM in supine Right 03/27/24  Supine  Right 04/03/24 Supine Right 04/15/2024 AROM  Shoulder flexion Supine P: 52* 90 limited by protocol PROM 140*, AROM 100* AROM 135 150 in wall slide   Shoulder extension       Shoulder abduction Supine P: 41* 45 limited by protocol PROM 90*, AROM 70* AROM 105   Shoulder adduction       Shoulder internal rotation Supine To stomach Limited by protocol      Shoulder external rotation Supine 20*limited by protocol  PROM 20*  at side, AROM 15* at side  AROM 40 at side AROM 45 deg  Elbow flexion       Elbow extension -5*      Wrist flexion       Wrist extension  Wrist ulnar deviation       Wrist radial deviation       Wrist pronation       Wrist supination       (Blank rows = not tested)  UPPER EXTREMITY MMT:  MMT Right Eval Not tested due to protocol Right 04/08/2024  Shoulder flexion  4+/5  Shoulder extension    Shoulder abduction  4/5  Shoulder adduction    Shoulder internal rotation  5/5  Shoulder external rotation  5/5  Middle trapezius    Lower trapezius    Elbow flexion    Elbow extension    Wrist flexion    Wrist extension    Wrist ulnar deviation    Wrist radial deviation    Wrist pronation    Wrist supination    Grip strength (lbs)    (Blank rows = not tested)  PALPATION:  03/04/2024: Pain with palpation to upper, middle & inferior trapezius muscles, pectoralis muscle, deltoid muscle, biceps muscle and ribs 4-8 under axilla.   EDEMA: 03/04/2024 Localized edema to right shoulder, scapula and axilla.                   TODAY'S TREATMENT:                                                                     DATE: 04/23/2024 TherActivity Standing shoulder flexion with superset 0-100 deg with 3 lb weight 2 sets: (10 together, 10 with one arm up at 90, switch, then 10 together) Standing Rt shoulder statue of liberty (shoulder flexion with elbow at 90 deg with trunk rotation away and return.  3 lb weight x 10       Neuro Re-ed: Tband rows c scapular retraction focus blue 2 x 15 Tband gh ext blue band 2 x 15  Body blade with elbow at side Rt arm 30 sec x 3 ER/IR Body blade with elbow at side Rt arm 30 sec x 3 fwd/back Body blade in 90 deg flexion side to side with blade vertical 30 sec x 3  Standing 100 deg flexion Rt arm outstretched at wall 2 lb ball cww, cw 30 x 2 each way    TODAY'S TREATMENT:                                                                     DATE:  04/17/2024 TherEx:  Wall slide into flexion with lift off x20 with 2s hold, able to reach 155deg  Standing IR with blue TB and towel under arm 2x15  Standing shoulder flexion with superset 0-100deg with 2# weight (10 together, 10 with one arm up at 90, switch, then 10 together)  Standing shoulder scaption with 2# weight 2x10 Standing shoulder abduction with 2# weight 2x10   Neuro Re-Ed:  Standing ball wall alphabet 2# ball x2  Standing straight arm lat pulldown with blue TB 2x15 Standing rows with blue TB 2x15  Standing ER walkouts with blue TB using towel under arm 2x15  with 3-5s Body blade at 90deg shoulder flexion 2x30s with blade vertical, 2x30s with blade horizontal   TODAY'S TREATMENT:                                                                     DATE: 04/15/2024 Therex: Wall slide into flexion with lift off hold 2 seconds x15  (performed to tightness around 150 deg) Supine horizontal abduction blue band 2 x 10  Standing blue band IR c towel under arm Rt 2 x 15  Standing shoulder flexion superset 0-100 deg 1 lb weight  (x 10 together, x 10 one arm while holding other in 90 deg, then x 10 switched, then x 10 together) Standing shoulder scaption superset 0-100 deg 2 lb weight  (x 10 together, x 10 one arm while holding other in 90 deg, then x 10 switched, then x 10 together)   Neuro Re-ed (postural activation, movement control) Tband rows with scap retraction focus 2 x 15 blue band Standing blue band high attachment GH ext to just past legs 2 x 15  Standing ball wall circles 2 lb ball in 100 deg shoulder flexion Rt 30 x 2 cw, ccw each Supine 90 deg stabilizations with mild resistance from clinician 30 sec  Standing Rt shoulder ER walk outs 5 sec hold x 12 with blue band    TODAY'S TREATMENT:                                                                      DATE: 04/08/2024 Therex: UBE fwd/back 3 mins each way lvl 2.0  Standing shoulder flexion to 90 deg to abduction to  side and reverse 2 x 10 - constant verbal cues required.   Neuro Re-ed (postural activation, movement control) Standing blue band rows c scapular retraction 2 x 15 Standing blue band high attachment GH ext to just past legs 2 x 15  Standing blue band ER walk outs Rt arm 5 sec hold x 10  Standing blue band ER with flexion punch 2 x 10 Rt  Prone Rt arm Y 1 lb 2 x 15 Prone Rt arm T 1 lb 2 x 15  Prone on elbows SA press 5 sec hold x 10 bilateral   PATIENT EDUCATION: 03/09/2024 Education details: HEP update Person educated: Patient Education method: Programmer, multimedia, Demonstration, Verbal cues, and Handouts Education comprehension: verbalized understanding, returned demonstration, and verbal cues required  HOME EXERCISE PROGRAM: Access Code: URL: [email or web address] Date: 04/01/2024 Prepared by: Grayce Spatz  Exercises - Seated Isometric Shoulder External Rotation with Towel  - 3-4 x daily - 7 x weekly - 1 sets - 10 reps - 5 seconds hold - Seated Isometric Shoulder Internal Rotation with Towel  - 3-4 x daily - 7 x weekly - 1 sets - 10 reps - 5 seconds hold - Seated Isometric Shoulder Flexion  - 3-4 x daily - 7 x weekly - 1 sets - 10 reps - 5 seconds hold - Standing Isometric Shoulder Extension with Doorway -  Arm Bent  - 3-4 x daily - 7 x weekly - 1 sets - 10 reps - 5 seconds hold - Supine Chin Tuck  - 3-4 x daily - 7 x weekly - 1 sets - 10 reps - 5 seconds hold - Seated Scapular Retraction  - 3-5 x daily - 7 x weekly - 1 sets - 5 reps - 3-5 hold - Supine Shoulder Protraction with Dowel  - 1-2 x daily - 7 x weekly - 1 sets - 10 reps - 3-5 hold - Supine Shoulder Flexion AAROM with Dowel  - 1 x daily - 7 x weekly - 3 sets - 10 reps - Supine Shoulder Abduction AAROM with Dowel  - 1 x daily - 7 x weekly - 2-3 sets - 10 reps - Seated Shoulder External Rotation AAROM with Dowel  - 1 x daily - 7 x weekly - 2-3 sets - 10 reps - Prone Shoulder Extension  - 1 x daily - 7 x  weekly - 1-2 sets - 10 reps - Seated Shoulder Flexion AAROM with Pulley Behind  - 1 x daily - 7 x weekly - 1 sets - 1 reps - 2-3 minutes hold - Seated Shoulder Scaption AAROM with Pulley at Side  - 1 x daily - 7 x weekly - 1 sets - 1 reps - 2-3 minutes hold - Prone Scapular Retraction  - 1 x daily - 7 x weekly - 2 sets - 10 reps - 5 seconds hold - Prone Scapular Retraction Arms at Side  - 1 x daily - 7 x weekly - 2 sets - 10 reps - 5 seconds hold - Prone Shoulder Row  - 1 x daily - 7 x weekly - 2 sets - 10 reps - 5 seconds hold - Prone Scapular Retraction Y  - 1 x daily - 7 x weekly - 2 sets - 10 reps - 5 seconds hold - Shoulder External Rotation Walkouts with Anchored Resistance  - 1 x daily - 7 x weekly - 2 sets - 10 reps - 5 seconds hold - Shoulder Internal Rotation Reactive Isometrics  - 1 x daily - 7 x weekly - 2 sets - 10 reps - 5 seconds hold   Active shoulder exercises at counter using shelves: Pretend holding a mug or cup so your hand and shoulders today in the proper position. Flexion -stand facing the counter with your right foot close to the counter on the left foot slightly back.  With each of the activities you are going to weight shift from the back left leg to the front right leg while reaching up with the right hand and when lowering the hand to the counter you are going to shift back to the left leg. Set your posture then move the cup from the counter to the first shelf, reset your posture, lower the cup back to the counter. Do 10 reps Set your posture then moved the cup from the counter to the first shelf, reset your posture moved the cup from the far shelf to the second shelf, reset your posture made the cut back down to the far shelf, reset your posture and move the cup back to the counter.  Do 10 reps Set your posture then moved the cup from the counter to the second shelf in one motion, reset your posture and move the cup from the second shelf back to the counter.  Do 10  reps. 2.   Scaption -stand with your feet facing to  the side with your right side beside the counter.  Your right foot should be positioned close to the counter and slightly forward compared to the left foot.  With each of the activities you are going to weight shift from the back leg to the front right leg while reaching up with the right hand and when lowering the hand back to the counter you will shift back to the left leg.  Your hand should say slightly in front of your shoulder. Set your posture then move the cup from the counter to the first shelf, reset your posture, lower the cup back to the counter. Do 10 reps Set your posture then moved the cup from the counter to the first shelf, reset your posture moved the cup from the far shelf to the second shelf, reset your posture made the cut back down to the far shelf, reset your posture and move the cup back to the counter.  Do 10 reps Set your posture then moved the cup from the counter to the second shelf in one motion, reset your posture and move the cup from the second shelf back to the counter.  Do 10 reps.    ASSESSMENT:  CLINICAL IMPRESSION: Mobility limits by protocol progressed today.  ER /abduction tightness observed in movement but progressing.  Now able to move to tolerance vs protocol limitations.  Continued skilled PT services c focus on dynamic stability and functional range gains indicated.    OBJECTIVE IMPAIRMENTS: decreased activity tolerance, decreased endurance, decreased knowledge of condition, decreased ROM, decreased strength, increased edema, increased muscle spasms, impaired UE functional use, postural dysfunction, and pain.   ACTIVITY LIMITATIONS: carrying, lifting, sleeping, stairs, bathing, dressing, reach over head, hygiene/grooming, and computer for work.   PARTICIPATION LIMITATIONS: meal prep, laundry, driving, occupation, and yard work  PERSONAL FACTORS: Past/current experiences and 1-2 comorbidities: see PMH  are also affecting patient's functional outcome.   REHAB POTENTIAL: Good  CLINICAL DECISION MAKING: Stable/uncomplicated  EVALUATION COMPLEXITY: Low   GOALS: Goals reviewed with patient? Yes  SHORT TERM GOALS: (target date for Short term goals 03/26/2024)  1.Patient will demonstrate independent use of home exercise program to maintain progress from in clinic treatments. Baseline: SEE OBJECTIVE DATA Goal status:  MET 03/27/2024  LONG TERM GOALS: (target dates for all long term goals 05/29/2024)   1. Patient will demonstrate/report pain at worst less than or equal to 2/10 to facilitate minimal limitation in daily activity secondary to pain symptoms. Baseline: SEE OBJECTIVE DATA Goal status:  Ongoing  04/01/2024   2. Patient will demonstrate independent use of home exercise program to facilitate ability to maintain/progress functional gains from skilled physical therapy services. Baseline: SEE OBJECTIVE DATA Goal status: Ongoing  04/01/2024   3.  Patient reports Patient-Specific Activity Score improved to >5 to indicate improvement in functional activities.  Baseline: SEE OBJECTIVE DATA Goal status: Ongoing  04/01/2024   4.  Patient will demonstrate right UE MMT 5/5 throughout to facilitate lifting, reaching, carrying at [facility] in daily activity.  Baseline: SEE OBJECTIVE DATA Goal status: Ongoing  04/01/2024   5.  Patient will demonstrate right GH joint AROM WFL s symptoms to facilitate usual overhead reaching, self care, dressing at PLOF.  Baseline: SEE OBJECTIVE DATA Goal status: Ongoing  04/01/2024   6.  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  Baseline: SEE OBJECTIVE DATA Goal status: Ongoing  04/01/2024    PLAN: PT FREQUENCY: 1x/wk for 4 weeks, then  2-3x/wk for 8 weeks  PT DURATION: 12 weeks  PLANNED INTERVENTIONS: 97164- PT Re-evaluation, 97110-Therapeutic exercises, 97530- Therapeutic activity, [id number]- Neuromuscular re-education, 97535- Self  Care, [id number]- Manual therapy, [id number]- Electrical stimulation (unattended), [id number]- Electrical stimulation (manual), 97016- Vasopneumatic device, 20560 (1-2 muscles), 20561 (3+ muscles)- Dry Needling, Patient/Family education, Taping, Scar mobilization, Cryotherapy, and Moist heat  PLAN FOR NEXT SESSION: Progressive motor control and strength gains.     Follow protocol in file box, Phase 3: 4-8 weeks (03/26/24 - 04/23/24)  AROM restrictions 160* FF, ER 45* at side, 160* ABD, IR behind back to waist level until 04/23/2024 - 8 weeks with protocol.   Ozell Silvan, PT, DPT, OCS, ATC 04/23/24  4:33 PM

## 2024-04-24 ENCOUNTER — Other Ambulatory Visit: Payer: Self-pay

## 2024-04-24 MED ORDER — SERTRALINE HCL 50 MG PO TABS: 50.0000 mg | ORAL_TABLET | Freq: Every day | ORAL | 3 refills | Status: AC

## 2024-04-28 NOTE — Therapy (Signed)
 OUTPATIENT PHYSICAL THERAPY TREATMENT   Patient Name: Jordan Robles MRN: [id number] DOB:02-Jul-1981, 43 y.o., male Today's Date: 04/29/2024  END OF SESSION:  PT End of Session - 04/29/24 0931     Visit Number 10    Number of Visits 25    Date for PT Re-Evaluation 05/29/24    Authorization Type [employer] PPO    Authorization Time Period NO AUTH NEEDED  DED AND OOP NOT MET, PAY TOWARDS OOP, NO COPAY    PT Start Time 0931    PT Stop Time 1013    PT Time Calculation (min) 42 min    Activity Tolerance Patient tolerated treatment well    Behavior During Therapy WFL for tasks assessed/performed               Past Medical History:  Diagnosis Date   ADHD (attention deficit hyperactivity disorder)    Genital warts    GERD (gastroesophageal reflux disease)    Hyperlipidemia    Hypertension    Learning disability    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 Eda, 13 adenomatous polyps and 2 hyperplastic polyps. repeat in one yr   ESOPHAGOGASTRODUODENOSCOPY  07/18/2022   per Dr. Eda, reflux esophagitis, neg for H pylori. 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   HYPERLIPIDEMIA 05/15/2007   LEARNING DISABILITY 05/15/2007    PCP: Johnny Garnette LABOR, MD  REFERRING PROVIDER: Genelle Standing MD  REFERRING DIAG: [phone] (ICD-10-CM) - Chronic right  shoulder pain   THERAPY DIAG:  Acute pain of right shoulder  Muscle weakness (generalized)  Stiffness of right shoulder, not elsewhere classified  Abnormal posture  Localized edema  Rationale for Evaluation and Treatment: Rehabilitation  ONSET DATE: 02/27/2024 right shoulder labral sg repair  SUBJECTIVE:                                                                                                                                                                                      SUBJECTIVE STATEMENT: Patient endorsing no issues with shoulder this date.  Hand dominance:  Right  PERTINENT HISTORY: ADHD, GERD, HTN, HLD, testicular CA  PAIN:  NPRS scale: 0/10 Pain location: right shoulder anterior Pain description:  discomfort  Aggravating factors: dressing/lifting arm to dress, putting pressure on it/leaning Relieving factors: prescription - ibuprofen , tylenol  and ice  PRECAUTIONS: Shoulder see protocol in file box  RED FLAGS: None   WEIGHT BEARING RESTRICTIONS: unable to use right arm per protocol   FALLS:  Has patient fallen in last 6 months? Yes. 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   NEXT MD VISIT: 04/15/2024  OBJECTIVE:  Note: Objective measures were completed at Evaluation unless otherwise noted.  DIAGNOSTIC FINDINGS:  01/27/2024 MRI Findings Bones: Mild AC joint arthrosis. Minimal joint is unremarkable with no fracture or contusion pattern. Mild subchondral cyst are seen in the posterior lateral humeral head likely related to  insertional tendinosis of the adjacent supraspinatus infraspinatus tendons. No subacromial or subdeltoid bursal collection is present.   Rotator cuff: There is mild insertional tendinosis of the supraspinatus tendon with mild bursal surface fraying. There is no significant partial or full thickness tear. Subscapularis and teres minor tendons are unremarkable.   Labrum and biceps tendon: Biceps tendon is intact. There is a nondisplaced posterior labral tear best seen on axial image 12 through 15 of series 3. There is a questionable chronic nondisplaced anterior labral tear with slight truncation of the anterior glenoid. Correlation for remote dislocation.  Patient-Specific Activity Scoring Scheme  0 represents "unable to perform." 10 represents "able to perform at prior level. 0 1 2 3 4 5 6 7 8 9  10 (Date and Score)   Activity Eval  03/04/2024 04/23/2024    1.  Computer work  0  10  2. Playing drums 0   7  3. Handy man tasks 0 7  4.    5.    Score 0 8 avg   Total score = sum of the activity scores/number of activities Minimum detectable change (90%CI) for average score = 2 points Minimum detectable change (90%CI) for single activity score = 3 points  COGNITION: 03/04/2024 Overall cognitive status: Within functional limits for tasks assessed     SENSATION: 03/04/2024 [facility]  POSTURE: 03/04/2024 Head forward, rounded shoulder,  UPPER EXTREMITY ROM:   ROM Right Eval 03/04/2024 Right 03/09/2024 PROM in supine Right 03/27/24  Supine  Right 04/03/24 Supine Right 04/15/2024 AROM  Shoulder flexion Supine P: 52* 90 limited by protocol PROM 140*, AROM 100* AROM 135 150 in wall slide   Shoulder extension       Shoulder abduction Supine P: 41* 45 limited by protocol PROM 90*, AROM 70* AROM 105   Shoulder adduction       Shoulder internal rotation Supine To stomach Limited by protocol      Shoulder external rotation Supine 20*limited by protocol  PROM 20* at side, AROM 15*  at side  AROM 40 at side AROM 45 deg  Elbow flexion       Elbow extension -5*      Wrist flexion       Wrist extension  Wrist ulnar deviation       Wrist radial deviation       Wrist pronation       Wrist supination       (Blank rows = not tested)  UPPER EXTREMITY MMT:  MMT Right Eval Not tested due to protocol Right 04/08/2024  Shoulder flexion  4+/5  Shoulder extension    Shoulder abduction  4/5  Shoulder adduction    Shoulder internal rotation  5/5  Shoulder external rotation  5/5  Middle trapezius    Lower trapezius    Elbow flexion    Elbow extension    Wrist flexion    Wrist extension    Wrist ulnar deviation    Wrist radial deviation    Wrist pronation    Wrist supination    Grip strength (lbs)    (Blank rows = not tested)  PALPATION:  03/04/2024: Pain with palpation to upper, middle & inferior trapezius muscles, pectoralis muscle, deltoid muscle, biceps muscle and ribs 4-8 under axilla.   EDEMA: 03/04/2024 Localized edema to right shoulder, scapula and axilla.                   TODAY'S TREATMENT:                                                                     DATE: 04/29/2024 TherEx:  Standing rows with blue TB 2x15 used as warmup  Wall slides 2x15  Standing Y on wall with yellow TB 2x8  Standing shoulder flexion superset (10 together, 10 with one while other arm at 90deg, switch) 2sets with 3 pounds  Standing shoulder scaption superset (10 together, 10 with one while other arm at 90deg, switch) 2sets with 3 pounds   Neuro Re-Ed:  Standing D2 PNF pattern with 2# weight, 3x8  Attempted as second exercises, but discontinued to do more warm up activities (wall slides and standing Y) Performed last two sets with back on wall to decrease trunk rotation; verbal cues required for keeping arm straight with excellent carryover  Standing D1 PNF pattern with 2# weight, 2x8  Body blade in 90deg flexion with blade vertical 2x35s  Body blade in 90deg  flexion with blade horizontal 2x35s Body blade with elbow at side  ER/IR 2x35s  Body blade with elbow at side fwd/backward 2x35s    TODAY'S TREATMENT:                                                                     DATE: 04/23/2024 TherActivity Standing shoulder flexion with superset 0-100 deg with 3 lb weight 2 sets: (10 together, 10 with one arm up at 90, switch, then 10 together) Standing Rt shoulder statue of liberty (shoulder flexion with elbow at 90 deg with trunk rotation away and return.  3 lb weight x 10    Neuro Re-ed: Tband rows c scapular retraction focus blue 2 x 15 Tband gh ext blue band 2 x 15  Body blade with elbow at side Rt  arm 30 sec x 3 ER/IR Body blade with elbow at side Rt arm 30 sec x 3 fwd/back Body blade in 90 deg flexion side to side with blade vertical 30 sec x 3  Standing 100 deg flexion Rt arm outstretched at wall 2 lb ball cww, cw 30 x 2 each way    TODAY'S TREATMENT:                                                                     DATE: 04/17/2024 TherEx:  Wall slide into flexion with lift off x20 with 2s hold, able to reach 155deg  Standing IR with blue TB and towel under arm 2x15  Standing shoulder flexion with superset 0-100deg with 2# weight (10 together, 10 with one arm up at 90, switch, then 10 together)  Standing shoulder scaption with 2# weight 2x10 Standing shoulder abduction with 2# weight 2x10   Neuro Re-Ed:  Standing ball wall alphabet 2# ball x2  Standing straight arm lat pulldown with blue TB 2x15 Standing rows with blue TB 2x15  Standing ER walkouts with blue TB using towel under arm 2x15 with 3-5s Body blade at 90deg shoulder flexion 2x30s with blade vertical, 2x30s with blade horizontal   TODAY'S TREATMENT:                                                                     DATE: 04/15/2024 Therex: Wall slide into flexion with lift off hold 2 seconds x15  (performed to tightness around 150 deg) Supine horizontal abduction blue  band 2 x 10  Standing blue band IR c towel under arm Rt 2 x 15  Standing shoulder flexion superset 0-100 deg 1 lb weight  (x 10 together, x 10 one arm while holding other in 90 deg, then x 10 switched, then x 10 together) Standing shoulder scaption superset 0-100 deg 2 lb weight  (x 10 together, x 10 one arm while holding other in 90 deg, then x 10 switched, then x 10 together)   Neuro Re-ed (postural activation, movement control) Tband rows with scap retraction focus 2 x 15 blue band Standing blue band high attachment GH ext to just past legs 2 x 15  Standing ball wall circles 2 lb ball in 100 deg shoulder flexion Rt 30 x 2 cw, ccw each Supine 90 deg stabilizations with mild resistance from clinician 30 sec  Standing Rt shoulder ER walk outs 5 sec hold x 12 with blue band    PATIENT EDUCATION: 03/09/2024 Education details: HEP update Person educated: Patient Education method: Programmer, multimedia, Demonstration, Verbal cues, and Handouts Education comprehension: verbalized understanding, returned demonstration, and verbal cues required  HOME EXERCISE PROGRAM: Access Code: URL: [email or web address] Date: 04/01/2024 Prepared by: Grayce Spatz  Exercises - Seated Isometric Shoulder External Rotation with Towel  - 3-4 x daily - 7 x weekly - 1 sets - 10 reps - 5 seconds hold - Seated Isometric Shoulder Internal Rotation with Towel  - 3-4 x daily - 7 x  weekly - 1 sets - 10 reps - 5 seconds hold - Seated Isometric Shoulder Flexion  - 3-4 x daily - 7 x weekly - 1 sets - 10 reps - 5 seconds hold - Standing Isometric Shoulder Extension with Doorway - Arm Bent  - 3-4 x daily - 7 x weekly - 1 sets - 10 reps - 5 seconds hold - Supine Chin Tuck  - 3-4 x daily - 7 x weekly - 1 sets - 10 reps - 5 seconds hold - Seated Scapular Retraction  - 3-5 x daily - 7 x weekly - 1 sets - 5 reps - 3-5 hold - Supine Shoulder Protraction with Dowel  - 1-2 x daily - 7 x weekly - 1 sets - 10 reps -  3-5 hold - Supine Shoulder Flexion AAROM with Dowel  - 1 x daily - 7 x weekly - 3 sets - 10 reps - Supine Shoulder Abduction AAROM with Dowel  - 1 x daily - 7 x weekly - 2-3 sets - 10 reps - Seated Shoulder External Rotation AAROM with Dowel  - 1 x daily - 7 x weekly - 2-3 sets - 10 reps - Prone Shoulder Extension  - 1 x daily - 7 x weekly - 1-2 sets - 10 reps - Seated Shoulder Flexion AAROM with Pulley Behind  - 1 x daily - 7 x weekly - 1 sets - 1 reps - 2-3 minutes hold - Seated Shoulder Scaption AAROM with Pulley at Side  - 1 x daily - 7 x weekly - 1 sets - 1 reps - 2-3 minutes hold - Prone Scapular Retraction  - 1 x daily - 7 x weekly - 2 sets - 10 reps - 5 seconds hold - Prone Scapular Retraction Arms at Side  - 1 x daily - 7 x weekly - 2 sets - 10 reps - 5 seconds hold - Prone Shoulder Row  - 1 x daily - 7 x weekly - 2 sets - 10 reps - 5 seconds hold - Prone Scapular Retraction Y  - 1 x daily - 7 x weekly - 2 sets - 10 reps - 5 seconds hold - Shoulder External Rotation Walkouts with Anchored Resistance  - 1 x daily - 7 x weekly - 2 sets - 10 reps - 5 seconds hold - Shoulder Internal Rotation Reactive Isometrics  - 1 x daily - 7 x weekly - 2 sets - 10 reps - 5 seconds hold   Active shoulder exercises at counter using shelves: Pretend holding a mug or cup so your hand and shoulders today in the proper position. Flexion -stand facing the counter with your right foot close to the counter on the left foot slightly back.  With each of the activities you are going to weight shift from the back left leg to the front right leg while reaching up with the right hand and when lowering the hand to the counter you are going to shift back to the left leg. Set your posture then move the cup from the counter to the first shelf, reset your posture, lower the cup back to the counter. Do 10 reps Set your posture then moved the cup from the counter to the first shelf, reset your posture moved the cup from the  far shelf to the second shelf, reset your posture made the cut back down to the far shelf, reset your posture and move the cup back to the counter.  Do 10 reps Set your posture  then moved the cup from the counter to the second shelf in one motion, reset your posture and move the cup from the second shelf back to the counter.  Do 10 reps. 2.   Scaption -stand with your feet facing to the side with your right side beside the counter.  Your right foot should be positioned close to the counter and slightly forward compared to the left foot.  With each of the activities you are going to weight shift from the back leg to the front right leg while reaching up with the right hand and when lowering the hand back to the counter you will shift back to the left leg.  Your hand should say slightly in front of your shoulder. Set your posture then move the cup from the counter to the first shelf, reset your posture, lower the cup back to the counter. Do 10 reps Set your posture then moved the cup from the counter to the first shelf, reset your posture moved the cup from the far shelf to the second shelf, reset your posture made the cut back down to the far shelf, reset your posture and move the cup back to the counter.  Do 10 reps Set your posture then moved the cup from the counter to the second shelf in one motion, reset your posture and move the cup from the second shelf back to the counter.  Do 10 reps.    ASSESSMENT:  CLINICAL IMPRESSION: Patient arrived to session this date with no pain or discomfort in shoulder, though did have some initial tightness with PNF movements that improved after wall slides. Patient tolerated all activities this date with minor noted muscle fatigue toward end of session. Patient discussed recreational activities with PT, to include bowling, as to which PT suggested modifications to technique (using other arm, etc.) as to not push the shoulder past what it is ready; patient  acknowledging and agreeing. Patient will continue to benefit from skilled PT.    OBJECTIVE IMPAIRMENTS: decreased activity tolerance, decreased endurance, decreased knowledge of condition, decreased ROM, decreased strength, increased edema, increased muscle spasms, impaired UE functional use, postural dysfunction, and pain.   ACTIVITY LIMITATIONS: carrying, lifting, sleeping, stairs, bathing, dressing, reach over head, hygiene/grooming, and computer for work.   PARTICIPATION LIMITATIONS: meal prep, laundry, driving, occupation, and yard work  PERSONAL FACTORS: Past/current experiences and 1-2 comorbidities: see PMH are also affecting patient's functional outcome.   REHAB POTENTIAL: Good  CLINICAL DECISION MAKING: Stable/uncomplicated  EVALUATION COMPLEXITY: Low   GOALS: Goals reviewed with patient? Yes  SHORT TERM GOALS: (target date for Short term goals 03/26/2024)  1.Patient will demonstrate independent use of home exercise program to maintain progress from in clinic treatments. Baseline: SEE OBJECTIVE DATA Goal status:  MET 03/27/2024  LONG TERM GOALS: (target dates for all long term goals 05/29/2024)   1. Patient will demonstrate/report pain at worst less than or equal to 2/10 to facilitate minimal limitation in daily activity secondary to pain symptoms. Baseline: SEE OBJECTIVE DATA Goal status:  Ongoing  04/01/2024   2. Patient will demonstrate independent use of home exercise program to facilitate ability to maintain/progress functional gains from skilled physical therapy services. Baseline: SEE OBJECTIVE DATA Goal status: Ongoing  04/01/2024   3.  Patient reports Patient-Specific Activity Score improved to >5 to indicate improvement in functional activities.  Baseline: SEE OBJECTIVE DATA Goal status: Ongoing  04/01/2024   4.  Patient will demonstrate right UE MMT 5/5 throughout to facilitate lifting, reaching,  carrying at [facility] in daily activity.  Baseline: SEE OBJECTIVE  DATA Goal status: Ongoing  04/01/2024   5.  Patient will demonstrate right GH joint AROM WFL s symptoms to facilitate usual overhead reaching, self care, dressing at PLOF.  Baseline: SEE OBJECTIVE DATA Goal status: Ongoing  04/01/2024   6.  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  Baseline: SEE OBJECTIVE DATA Goal status: Ongoing  04/01/2024    PLAN: PT FREQUENCY: 1x/wk for 4 weeks, then 2-3x/wk for 8 weeks  PT DURATION: 12 weeks  PLANNED INTERVENTIONS: 97164- PT Re-evaluation, 97110-Therapeutic exercises, 97530- Therapeutic activity, 97112- Neuromuscular re-education, 97535- Self Care, [id number]- Manual therapy, [id number]- Electrical stimulation (unattended), [id number]- Electrical stimulation (manual), 97016- Vasopneumatic device, 20560 (1-2 muscles), 20561 (3+ muscles)- Dry Needling, Patient/Family education, Taping, Scar mobilization, Cryotherapy, and Moist heat  PLAN FOR NEXT SESSION: Progressive motor control and strength gains.     Follow protocol in file box, Phase 3: 4-8 weeks (03/26/24 - 04/23/24)  AROM restrictions 160* FF, ER 45* at side, 160* ABD, IR behind back to waist level until 04/23/2024 - 8 weeks with protocol.   Susannah Daring, PT, DPT 04/29/24 12:32 PM

## 2024-04-29 ENCOUNTER — Ambulatory Visit: Payer: Self-pay

## 2024-04-29 DIAGNOSIS — M25511 Pain in right shoulder: Secondary | ICD-10-CM

## 2024-04-29 DIAGNOSIS — M6281 Muscle weakness (generalized): Secondary | ICD-10-CM | POA: Diagnosis not present

## 2024-04-29 DIAGNOSIS — M25611 Stiffness of right shoulder, not elsewhere classified: Secondary | ICD-10-CM | POA: Diagnosis not present

## 2024-04-29 DIAGNOSIS — R600 Localized edema: Secondary | ICD-10-CM

## 2024-04-29 DIAGNOSIS — R293 Abnormal posture: Secondary | ICD-10-CM

## 2024-05-06 ENCOUNTER — Encounter: Payer: Self-pay | Admitting: Rehabilitative and Restorative Service Providers"

## 2024-05-06 ENCOUNTER — Ambulatory Visit (INDEPENDENT_AMBULATORY_CARE_PROVIDER_SITE_OTHER): Admitting: Rehabilitative and Restorative Service Providers"

## 2024-05-06 DIAGNOSIS — M6281 Muscle weakness (generalized): Secondary | ICD-10-CM

## 2024-05-06 DIAGNOSIS — R600 Localized edema: Secondary | ICD-10-CM

## 2024-05-06 DIAGNOSIS — R293 Abnormal posture: Secondary | ICD-10-CM | POA: Diagnosis not present

## 2024-05-06 DIAGNOSIS — M25611 Stiffness of right shoulder, not elsewhere classified: Secondary | ICD-10-CM | POA: Diagnosis not present

## 2024-05-06 DIAGNOSIS — M25511 Pain in right shoulder: Secondary | ICD-10-CM

## 2024-05-06 NOTE — Therapy (Signed)
 OUTPATIENT PHYSICAL THERAPY TREATMENT   Patient Name: Jordan Robles MRN: [id number] DOB:1981-06-08, 43 y.o., male Today's Date: 05/06/2024  END OF SESSION:  PT End of Session - 05/06/24 0937     Visit Number 11    Number of Visits 25    Date for PT Re-Evaluation 05/29/24    Authorization Type [employer] PPO    Authorization Time Period NO AUTH NEEDED  DED AND OOP NOT MET, PAY TOWARDS OOP, NO COPAY    PT Start Time 0931    PT Stop Time 1010    PT Time Calculation (min) 39 min    Activity Tolerance Patient tolerated treatment well    Behavior During Therapy WFL for tasks assessed/performed                Past Medical History:  Diagnosis Date   ADHD (attention deficit hyperactivity disorder)    Genital warts    GERD (gastroesophageal reflux disease)    Hyperlipidemia    Hypertension    Learning disability    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 Eda, 13 adenomatous polyps and 2 hyperplastic polyps. repeat in one yr   ESOPHAGOGASTRODUODENOSCOPY  07/18/2022   per Dr. Eda, reflux esophagitis, neg for H pylori. 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   HYPERLIPIDEMIA 05/15/2007   LEARNING DISABILITY 05/15/2007    PCP: Johnny Garnette LABOR, MD  REFERRING PROVIDER: Genelle Standing MD  REFERRING DIAG: [phone] (ICD-10-CM) - Chronic  right shoulder pain   THERAPY DIAG:  Acute pain of right shoulder  Muscle weakness (generalized)  Stiffness of right shoulder, not elsewhere classified  Abnormal posture  Localized edema  Rationale for Evaluation and Treatment: Rehabilitation  ONSET DATE: 02/27/2024 right shoulder labral sg repair  SUBJECTIVE:                                                                                                                                                                                      SUBJECTIVE STATEMENT: Pt indicated feeling no pain today.  Reported he did  use the bowling ball without complaints noted during or afterward.   Hand dominance: Right  PERTINENT HISTORY: ADHD, GERD, HTN, HLD, testicular CA  PAIN:  NPRS scale: 0/10 Pain location: right shoulder anterior Pain description:  discomfort  Aggravating factors: dressing/lifting arm to dress, putting pressure on it/leaning Relieving factors: prescription - ibuprofen , tylenol  and ice  PRECAUTIONS: Shoulder see protocol in file box  RED FLAGS: None   WEIGHT BEARING RESTRICTIONS: unable to use right arm per protocol   FALLS:  Has patient fallen in last 6 months? Yes. 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   NEXT MD VISIT: 04/15/2024  OBJECTIVE:  Note: Objective measures were completed at Evaluation unless otherwise noted.  DIAGNOSTIC FINDINGS:  01/27/2024 MRI Findings Bones: Mild AC joint arthrosis. Minimal joint is unremarkable with no fracture or contusion pattern. Mild  subchondral cyst are seen in the posterior lateral humeral head likely related to insertional tendinosis of the adjacent supraspinatus infraspinatus tendons. No subacromial or subdeltoid bursal collection is present.   Rotator cuff: There is mild insertional tendinosis of the supraspinatus tendon with mild bursal surface fraying. There is no significant partial or full thickness tear. Subscapularis and teres minor tendons are unremarkable.   Labrum and biceps tendon: Biceps tendon is intact. There is a nondisplaced posterior labral tear best seen on axial image 12 through 15 of series 3. There is a questionable chronic nondisplaced anterior labral tear with slight truncation of the anterior glenoid. Correlation for remote dislocation.  Patient-Specific Activity Scoring Scheme  0 represents "unable to perform." 10 represents "able to perform at prior level. 0 1 2 3 4 5 6 7 8 9  10 (Date and Score)   Activity Eval  03/04/2024 04/23/2024    1.  Computer work  0  10  2. Playing drums 0   7  3. Handy man tasks 0 7  4.    5.    Score 0 8 avg   Total score = sum of the activity scores/number of activities Minimum detectable change (90%CI) for average score = 2 points Minimum detectable change (90%CI) for single activity score = 3 points  COGNITION: 03/04/2024 Overall cognitive status: Within functional limits for tasks assessed     SENSATION: 03/04/2024 [facility]  POSTURE: 03/04/2024 Head forward, rounded shoulder,  UPPER EXTREMITY ROM:   ROM Right Eval 03/04/2024 Right 03/09/2024 PROM in supine Right 03/27/24  Supine  Right 04/03/24 Supine Right 04/15/2024 AROM  Shoulder flexion Supine P: 52* 90 limited by protocol PROM 140*, AROM 100* AROM 135 150 in wall slide   Shoulder extension       Shoulder abduction Supine P: 41* 45 limited by protocol PROM 90*, AROM 70* AROM 105   Shoulder adduction       Shoulder internal rotation Supine To stomach Limited by protocol       Shoulder external rotation Supine 20*limited by protocol  PROM 20* at side, AROM 15* at side  AROM 40 at side AROM 45 deg  Elbow flexion       Elbow extension -5*  Wrist flexion       Wrist extension       Wrist ulnar deviation       Wrist radial deviation       Wrist pronation       Wrist supination       (Blank rows = not tested)  UPPER EXTREMITY MMT:  MMT Right Eval Not tested due to protocol Right 04/08/2024  Shoulder flexion  4+/5  Shoulder extension    Shoulder abduction  4/5  Shoulder adduction    Shoulder internal rotation  5/5  Shoulder external rotation  5/5  Middle trapezius    Lower trapezius    Elbow flexion    Elbow extension    Wrist flexion    Wrist extension    Wrist ulnar deviation    Wrist radial deviation    Wrist pronation    Wrist supination    Grip strength (lbs)    (Blank rows = not tested)  PALPATION:  03/04/2024: Pain with palpation to upper, middle & inferior trapezius muscles, pectoralis muscle, deltoid muscle, biceps muscle and ribs 4-8 under axilla.   EDEMA: 03/04/2024 Localized edema to right shoulder, scapula and axilla.                   TODAY'S TREATMENT:                                                                     DATE: 05/06/2024 TherActivity(push, pull, lift, carry, reach improvements) UBE fwd/back push/pull lvl 3.0 with :15 second faster interval top of each minute.  4 mins fwd/back each with 1 min rest between directions.   Neuro Re-ed (muscle control, coordination, scapular control) Bodyblade ER/IR with arm at side 30 sec x 4 Rt  Bodyblade flexion punch with arm at side 30 sec x 4 Rt Standing green band around wrists ER pulses with flexion punch x 10 Standing push up position at wall lateral band pulls green band (3 points) x 10 bilaterally  Wall push up with SA press hold 2 x 10  Prone Y 2 x 15 bilateral, leaning on small pball Prone T 2 x 15 bilateral, leaning on small pball Standing small pball at wall Rt  shoulder in 90 deg flexion cw, ccw small circles 30 x each, abduction 30 x cw, ccw each    TODAY'S TREATMENT:                                                                     DATE: 04/29/2024 TherEx:  Standing rows with blue TB 2x15 used as warmup  Wall slides 2x15  Standing Y on wall with yellow TB 2x8  Standing shoulder flexion superset (10 together, 10 with one while other arm at 90deg, switch) 2sets with 3 pounds  Standing shoulder scaption superset (10 together, 10 with one while other arm at 90deg, switch) 2sets with 3 pounds   Neuro Re-Ed:  Standing D2 PNF pattern with 2# weight, 3x8  Attempted as second exercises, but  discontinued to do more warm up activities (wall slides and standing Y) Performed last two sets with back on wall to decrease trunk rotation; verbal cues required for keeping arm straight with excellent carryover  Standing D1 PNF pattern with 2# weight, 2x8  Body blade in 90deg flexion with blade vertical 2x35s  Body blade in 90deg flexion with blade horizontal 2x35s Body blade with elbow at side  ER/IR 2x35s  Body blade with elbow at side fwd/backward 2x35s    TODAY'S TREATMENT:                                                                     DATE: 04/23/2024 TherActivity Standing shoulder flexion with superset 0-100 deg with 3 lb weight 2 sets: (10 together, 10 with one arm up at 90, switch, then 10 together) Standing Rt shoulder statue of liberty (shoulder flexion with elbow at 90 deg with trunk rotation away and return.  3 lb weight x 10    Neuro Re-ed: Tband rows c scapular retraction focus blue 2 x 15 Tband gh ext blue band 2 x 15  Body blade with elbow at side Rt arm 30 sec x 3 ER/IR Body blade with elbow at side Rt arm 30 sec x 3 fwd/back Body blade in 90 deg flexion side to side with blade vertical 30 sec x 3  Standing 100 deg flexion Rt arm outstretched at wall 2 lb ball cww, cw 30 x 2 each way    TODAY'S TREATMENT:                                                                      DATE: 04/17/2024 TherEx:  Wall slide into flexion with lift off x20 with 2s hold, able to reach 155deg  Standing IR with blue TB and towel under arm 2x15  Standing shoulder flexion with superset 0-100deg with 2# weight (10 together, 10 with one arm up at 90, switch, then 10 together)  Standing shoulder scaption with 2# weight 2x10 Standing shoulder abduction with 2# weight 2x10   Neuro Re-Ed:  Standing ball wall alphabet 2# ball x2  Standing straight arm lat pulldown with blue TB 2x15 Standing rows with blue TB 2x15  Standing ER walkouts with blue TB using towel under arm 2x15 with 3-5s Body blade at 90deg shoulder flexion 2x30s with blade vertical, 2x30s with blade horizontal  PATIENT EDUCATION: 03/09/2024 Education details: HEP update Person educated: Patient Education method: Programmer, multimedia, Demonstration, Verbal cues, and Handouts Education comprehension: verbalized understanding, returned demonstration, and verbal cues required  HOME EXERCISE PROGRAM: Access Code: URL: [email or web address] Date: 04/01/2024 Prepared by: Grayce Spatz  Exercises - Seated Isometric Shoulder External Rotation with Towel  - 3-4 x daily - 7 x weekly - 1 sets - 10 reps - 5 seconds hold - Seated Isometric Shoulder Internal Rotation with Towel  - 3-4 x daily - 7 x weekly - 1 sets - 10 reps - 5 seconds hold - Seated Isometric Shoulder Flexion  - 3-4  x daily - 7 x weekly - 1 sets - 10 reps - 5 seconds hold - Standing Isometric Shoulder Extension with Doorway - Arm Bent  - 3-4 x daily - 7 x weekly - 1 sets - 10 reps - 5 seconds hold - Supine Chin Tuck  - 3-4 x daily - 7 x weekly - 1 sets - 10 reps - 5 seconds hold - Seated Scapular Retraction  - 3-5 x daily - 7 x weekly - 1 sets - 5 reps - 3-5 hold - Supine Shoulder Protraction with Dowel  - 1-2 x daily - 7 x weekly - 1 sets - 10 reps - 3-5 hold - Supine Shoulder Flexion AAROM with Dowel  - 1 x  daily - 7 x weekly - 3 sets - 10 reps - Supine Shoulder Abduction AAROM with Dowel  - 1 x daily - 7 x weekly - 2-3 sets - 10 reps - Seated Shoulder External Rotation AAROM with Dowel  - 1 x daily - 7 x weekly - 2-3 sets - 10 reps - Prone Shoulder Extension  - 1 x daily - 7 x weekly - 1-2 sets - 10 reps - Seated Shoulder Flexion AAROM with Pulley Behind  - 1 x daily - 7 x weekly - 1 sets - 1 reps - 2-3 minutes hold - Seated Shoulder Scaption AAROM with Pulley at Side  - 1 x daily - 7 x weekly - 1 sets - 1 reps - 2-3 minutes hold - Prone Scapular Retraction  - 1 x daily - 7 x weekly - 2 sets - 10 reps - 5 seconds hold - Prone Scapular Retraction Arms at Side  - 1 x daily - 7 x weekly - 2 sets - 10 reps - 5 seconds hold - Prone Shoulder Row  - 1 x daily - 7 x weekly - 2 sets - 10 reps - 5 seconds hold - Prone Scapular Retraction Y  - 1 x daily - 7 x weekly - 2 sets - 10 reps - 5 seconds hold - Shoulder External Rotation Walkouts with Anchored Resistance  - 1 x daily - 7 x weekly - 2 sets - 10 reps - 5 seconds hold - Shoulder Internal Rotation Reactive Isometrics  - 1 x daily - 7 x weekly - 2 sets - 10 reps - 5 seconds hold   Active shoulder exercises at counter using shelves: Pretend holding a mug or cup so your hand and shoulders today in the proper position. Flexion -stand facing the counter with your right foot close to the counter on the left foot slightly back.  With each of the activities you are going to weight shift from the back left leg to the front right leg while reaching up with the right hand and when lowering the hand to the counter you are going to shift back to the left leg. Set your posture then move the cup from the counter to the first shelf, reset your posture, lower the cup back to the counter. Do 10 reps Set your posture then moved the cup from the counter to the first shelf, reset your posture moved the cup from the far shelf to the second shelf, reset your posture made the cut  back down to the far shelf, reset your posture and move the cup back to the counter.  Do 10 reps Set your posture then moved the cup from the counter to the second shelf in one motion, reset your posture and move  the cup from the second shelf back to the counter.  Do 10 reps. 2.   Scaption -stand with your feet facing to the side with your right side beside the counter.  Your right foot should be positioned close to the counter and slightly forward compared to the left foot.  With each of the activities you are going to weight shift from the back leg to the front right leg while reaching up with the right hand and when lowering the hand back to the counter you will shift back to the left leg.  Your hand should say slightly in front of your shoulder. Set your posture then move the cup from the counter to the first shelf, reset your posture, lower the cup back to the counter. Do 10 reps Set your posture then moved the cup from the counter to the first shelf, reset your posture moved the cup from the far shelf to the second shelf, reset your posture made the cut back down to the far shelf, reset your posture and move the cup back to the counter.  Do 10 reps Set your posture then moved the cup from the counter to the second shelf in one motion, reset your posture and move the cup from the second shelf back to the counter.  Do 10 reps.    ASSESSMENT:  CLINICAL IMPRESSION: Patient arrived to session this date with no pain or discomfort in shoulder, though did have some initial tightness with PNF movements that improved after wall slides. Patient tolerated all activities this date with minor noted muscle fatigue toward end of session. Patient discussed recreational activities with PT, to include bowling, as to which PT suggested modifications to technique (using other arm, etc.) as to not push the shoulder past what it is ready; patient acknowledging and agreeing. Patient will continue to benefit from skilled  PT.    OBJECTIVE IMPAIRMENTS: decreased activity tolerance, decreased endurance, decreased knowledge of condition, decreased ROM, decreased strength, increased edema, increased muscle spasms, impaired UE functional use, postural dysfunction, and pain.   ACTIVITY LIMITATIONS: carrying, lifting, sleeping, stairs, bathing, dressing, reach over head, hygiene/grooming, and computer for work.   PARTICIPATION LIMITATIONS: meal prep, laundry, driving, occupation, and yard work  PERSONAL FACTORS: Past/current experiences and 1-2 comorbidities: see PMH are also affecting patient's functional outcome.   REHAB POTENTIAL: Good  CLINICAL DECISION MAKING: Stable/uncomplicated  EVALUATION COMPLEXITY: Low   GOALS: Goals reviewed with patient? Yes  SHORT TERM GOALS: (target date for Short term goals 03/26/2024)  1.Patient will demonstrate independent use of home exercise program to maintain progress from in clinic treatments. Baseline: SEE OBJECTIVE DATA Goal status:  MET 03/27/2024  LONG TERM GOALS: (target dates for all long term goals 05/29/2024)   1. Patient will demonstrate/report pain at worst less than or equal to 2/10 to facilitate minimal limitation in daily activity secondary to pain symptoms. Baseline: SEE OBJECTIVE DATA Goal status:  Ongoing  04/01/2024   2. Patient will demonstrate independent use of home exercise program to facilitate ability to maintain/progress functional gains from skilled physical therapy services. Baseline: SEE OBJECTIVE DATA Goal status: Ongoing  04/01/2024   3.  Patient reports Patient-Specific Activity Score improved to >5 to indicate improvement in functional activities.  Baseline: SEE OBJECTIVE DATA Goal status: Ongoing  04/01/2024   4.  Patient will demonstrate right UE MMT 5/5 throughout to facilitate lifting, reaching, carrying at [facility] in daily activity.  Baseline: SEE OBJECTIVE DATA Goal status: Ongoing  04/01/2024   5.  Patient will demonstrate right  GH joint AROM WFL s symptoms to facilitate usual overhead reaching, self care, dressing at PLOF.  Baseline: SEE OBJECTIVE DATA Goal status: Ongoing  04/01/2024   6.  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  Baseline: SEE OBJECTIVE DATA Goal status: Ongoing  04/01/2024    PLAN: PT FREQUENCY: 1x/wk for 4 weeks, then 2-3x/wk for 8 weeks  PT DURATION: 12 weeks  PLANNED INTERVENTIONS: 97164- PT Re-evaluation, 97110-Therapeutic exercises, 97530- Therapeutic activity, 97112- Neuromuscular re-education, 97535- Self Care, [id number]- Manual therapy, [id number]- Electrical stimulation (unattended), [id number]- Electrical stimulation (manual), 97016- Vasopneumatic device, 20560 (1-2 muscles), 20561 (3+ muscles)- Dry Needling, Patient/Family education, Taping, Scar mobilization, Cryotherapy, and Moist heat  PLAN FOR NEXT SESSION: Update HEP to reflect progressions in last few weeks.     protocol in file box, past 8 weeks now.    Ozell Silvan, PT, DPT, OCS, ATC 05/06/24  10:11 AM

## 2024-05-08 NOTE — Therapy (Addendum)
 " OUTPATIENT PHYSICAL THERAPY TREATMENT / DISCHARGE   Patient Name: Jordan Robles MRN: [id number] DOB:Feb 05, 1981, 43 y.o., male Today's Date: 05/11/2024  END OF SESSION:  PT End of Session - 05/11/24 0855     Visit Number 12    Number of Visits 25    Date for PT Re-Evaluation 05/29/24    Authorization Type [employer] PPO    Authorization Time Period NO AUTH NEEDED  DED AND OOP NOT MET, PAY TOWARDS OOP, NO COPAY    PT Start Time 0855    PT Stop Time 0931    PT Time Calculation (min) 36 min    Activity Tolerance Patient tolerated treatment well    Behavior During Therapy WFL for tasks assessed/performed            Past Medical History:  Diagnosis Date   ADHD (attention deficit hyperactivity disorder)    Genital warts    GERD (gastroesophageal reflux disease)    Hyperlipidemia    Hypertension    Learning disability    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 Eda, 13 adenomatous polyps and 2 hyperplastic polyps. repeat in one yr   ESOPHAGOGASTRODUODENOSCOPY  07/18/2022   per Dr. Eda, reflux esophagitis, neg for H pylori. 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   HYPERLIPIDEMIA 05/15/2007   LEARNING DISABILITY 05/15/2007    PCP: Johnny Garnette LABOR, MD  REFERRING PROVIDER: Genelle Standing MD  REFERRING DIAG: [phone] (ICD-10-CM) - Chronic  right shoulder pain   THERAPY DIAG:  Acute pain of right shoulder  Muscle weakness (generalized)  Stiffness of right shoulder, not elsewhere classified  Abnormal posture  Localized edema  Rationale for Evaluation and Treatment: Rehabilitation  ONSET DATE: 02/27/2024 right shoulder labral sg repair  SUBJECTIVE:                                                                                                                                                                                      SUBJECTIVE STATEMENT: Patient notes no pain today.   Hand dominance: Right  PERTINENT HISTORY: ADHD, GERD, HTN, HLD, testicular CA  PAIN:  NPRS scale: 0/10 Pain location: right shoulder anterior Pain description:  discomfort  Aggravating factors: dressing/lifting arm to dress, putting pressure on it/leaning Relieving factors: prescription - ibuprofen , tylenol  and ice  PRECAUTIONS: Shoulder see protocol in file box  RED FLAGS: None   WEIGHT BEARING RESTRICTIONS: unable to use right arm per protocol   FALLS:  Has patient fallen in last 6 months? Yes. 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   NEXT MD VISIT: 04/15/2024  OBJECTIVE:  Note: Objective measures were completed at Evaluation unless otherwise noted.  DIAGNOSTIC FINDINGS:  01/27/2024 MRI Findings Bones: Mild AC joint arthrosis. Minimal joint is unremarkable with no fracture or contusion pattern. Mild subchondral cyst are seen in the posterior lateral humeral head likely related to insertional  tendinosis of the adjacent supraspinatus infraspinatus tendons. No subacromial or subdeltoid bursal collection is present.   Rotator cuff: There is mild insertional tendinosis of the supraspinatus tendon with mild bursal surface fraying. There is no significant partial or full thickness tear. Subscapularis and teres minor tendons are unremarkable.   Labrum and biceps tendon: Biceps tendon is intact. There is a nondisplaced posterior labral tear best seen on axial image 12 through 15 of series 3. There is a questionable chronic nondisplaced anterior labral tear with slight truncation of the anterior glenoid. Correlation for remote dislocation.  Patient-Specific Activity Scoring Scheme  0 represents unable to perform. 10 represents able to perform at prior level. 0 1 2 3 4 5 6 7 8 9  10 (Date and Score)   Activity Eval  03/04/2024 04/23/2024    1.  Computer work  0  10  2. Playing drums 0   7  3. Handy man tasks 0 7  4.    5.    Score 0 8 avg   Total score = sum of the activity scores/number of activities Minimum detectable change (90%CI) for average score = 2 points Minimum detectable change (90%CI) for single activity score = 3 points  COGNITION: 03/04/2024 Overall cognitive status: Within functional limits for tasks assessed     SENSATION: 03/04/2024 [facility]  POSTURE: 03/04/2024 Head forward, rounded shoulder,  UPPER EXTREMITY ROM:   ROM Right Eval 03/04/2024 Right 03/09/2024 PROM in supine Right 03/27/24  Supine  Right 04/03/24 Supine Right 04/15/2024 AROM  Shoulder flexion Supine P: 52* 90 limited by protocol PROM 140*, AROM 100* AROM 135 150 in wall slide   Shoulder extension       Shoulder abduction Supine P: 41* 45 limited by protocol PROM 90*, AROM 70* AROM 105   Shoulder adduction       Shoulder internal rotation Supine To stomach Limited by protocol      Shoulder external rotation Supine 20*limited by protocol  PROM 20* at side, AROM 15* at side   AROM 40 at side AROM 45 deg  Elbow flexion       Elbow extension -5*      Wrist flexion       Wrist extension  Wrist ulnar deviation       Wrist radial deviation       Wrist pronation       Wrist supination       (Blank rows = not tested)  UPPER EXTREMITY MMT:  MMT Right Eval Not tested due to protocol Right 04/08/2024  Shoulder flexion  4+/5  Shoulder extension    Shoulder abduction  4/5  Shoulder adduction    Shoulder internal rotation  5/5  Shoulder external rotation  5/5  Middle trapezius    Lower trapezius    Elbow flexion    Elbow extension    Wrist flexion    Wrist extension    Wrist ulnar deviation    Wrist radial deviation    Wrist pronation    Wrist supination    Grip strength (lbs)    (Blank rows = not tested)  PALPATION:  03/04/2024: Pain with palpation to upper, middle & inferior trapezius muscles, pectoralis muscle, deltoid muscle, biceps muscle and ribs 4-8 under axilla.   EDEMA: 03/04/2024 Localized edema to right shoulder, scapula and axilla.                   TODAY'S TREATMENT:                                                                     DATE: 05/11/2024 TherAct:  UBE level 3, fwd x4 minutes, backward x4 minutes with 15 second quick interval at top of each minute; 1 minute rest between fwd/backward  Neuro Re-Ed:  Standing D2 PNF pattern with 2# weight 2x10  Standing alphabet on wall with small ball on wall, flexion 2x, abduction 2x  Standing push up position at wall lateral band pulls green band (3 points) x10 bilat Body blade ER/ER 3x45s  Body blade elbow flexed at 90, fwd/back 2x45s Wall push up plus 2x10   TODAY'S TREATMENT:                                                                     DATE: 05/06/2024 TherActivity(push, pull, lift, carry, reach improvements) UBE fwd/back push/pull lvl 3.0 with :15 second faster interval top of each minute.  4 mins fwd/back each with 1 min rest between directions.   Neuro Re-ed (muscle  control, coordination, scapular control) Bodyblade ER/IR with arm at side 30 sec x 4 Rt  Bodyblade flexion punch with arm at side 30 sec x 4 Rt Standing green band around wrists ER pulses with flexion punch x 10 Standing push up position at wall lateral band pulls green band (3 points) x 10 bilaterally  Wall push up with SA press hold 2 x 10  Prone Y 2 x 15 bilateral, leaning on small pball Prone T 2 x 15 bilateral, leaning on small pball Standing small pball at wall Rt shoulder in 90 deg flexion cw, ccw small circles 30 x each, abduction 30 x cw, ccw each    TODAY'S TREATMENT:  DATE: 04/29/2024 TherEx:  Standing rows with blue TB 2x15 used as warmup  Wall slides 2x15  Standing Y on wall with yellow TB 2x8  Standing shoulder flexion superset (10 together, 10 with one while other arm at 90deg, switch) 2sets with 3 pounds  Standing shoulder scaption superset (10 together, 10 with one while other arm at 90deg, switch) 2sets with 3 pounds   Neuro Re-Ed:  Standing D2 PNF pattern with 2# weight, 3x8  Attempted as second exercises, but discontinued to do more warm up activities (wall slides and standing Y) Performed last two sets with back on wall to decrease trunk rotation; verbal cues required for keeping arm straight with excellent carryover  Standing D1 PNF pattern with 2# weight, 2x8  Body blade in 90deg flexion with blade vertical 2x35s  Body blade in 90deg flexion with blade horizontal 2x35s Body blade with elbow at side  ER/IR 2x35s  Body blade with elbow at side fwd/backward 2x35s    TODAY'S TREATMENT:                                                                     DATE: 04/23/2024 TherActivity Standing shoulder flexion with superset 0-100 deg with 3 lb weight 2 sets: (10 together, 10 with one arm up at 90, switch, then 10 together) Standing Rt shoulder statue of liberty (shoulder flexion with elbow at 90 deg  with trunk rotation away and return.  3 lb weight x 10    Neuro Re-ed: Tband rows c scapular retraction focus blue 2 x 15 Tband gh ext blue band 2 x 15  Body blade with elbow at side Rt arm 30 sec x 3 ER/IR Body blade with elbow at side Rt arm 30 sec x 3 fwd/back Body blade in 90 deg flexion side to side with blade vertical 30 sec x 3  Standing 100 deg flexion Rt arm outstretched at wall 2 lb ball cww, cw 30 x 2 each way    PATIENT EDUCATION: 03/09/2024 Education details: HEP update Person educated: Patient Education method: Programmer, Multimedia, Demonstration, Verbal cues, and Handouts Education comprehension: verbalized understanding, returned demonstration, and verbal cues required  HOME EXERCISE PROGRAM: Access Code: URL: [email or web address] Date: 04/01/2024 Prepared by: Grayce Spatz  Exercises - Seated Isometric Shoulder External Rotation with Towel  - 3-4 x daily - 7 x weekly - 1 sets - 10 reps - 5 seconds hold - Seated Isometric Shoulder Internal Rotation with Towel  - 3-4 x daily - 7 x weekly - 1 sets - 10 reps - 5 seconds hold - Seated Isometric Shoulder Flexion  - 3-4 x daily - 7 x weekly - 1 sets - 10 reps - 5 seconds hold - Standing Isometric Shoulder Extension with Doorway - Arm Bent  - 3-4 x daily - 7 x weekly - 1 sets - 10 reps - 5 seconds hold - Supine Chin Tuck  - 3-4 x daily - 7 x weekly - 1 sets - 10 reps - 5 seconds hold - Seated Scapular Retraction  - 3-5 x daily - 7 x weekly - 1 sets - 5 reps - 3-5 hold - Supine Shoulder Protraction with Dowel  - 1-2 x daily - 7 x weekly - 1 sets - 10  reps - 3-5 hold - Supine Shoulder Flexion AAROM with Dowel  - 1 x daily - 7 x weekly - 3 sets - 10 reps - Supine Shoulder Abduction AAROM with Dowel  - 1 x daily - 7 x weekly - 2-3 sets - 10 reps - Seated Shoulder External Rotation AAROM with Dowel  - 1 x daily - 7 x weekly - 2-3 sets - 10 reps - Prone Shoulder Extension  - 1 x daily - 7 x weekly - 1-2 sets - 10  reps - Seated Shoulder Flexion AAROM with Pulley Behind  - 1 x daily - 7 x weekly - 1 sets - 1 reps - 2-3 minutes hold - Seated Shoulder Scaption AAROM with Pulley at Side  - 1 x daily - 7 x weekly - 1 sets - 1 reps - 2-3 minutes hold - Prone Scapular Retraction  - 1 x daily - 7 x weekly - 2 sets - 10 reps - 5 seconds hold - Prone Scapular Retraction Arms at Side  - 1 x daily - 7 x weekly - 2 sets - 10 reps - 5 seconds hold - Prone Shoulder Row  - 1 x daily - 7 x weekly - 2 sets - 10 reps - 5 seconds hold - Prone Scapular Retraction Y  - 1 x daily - 7 x weekly - 2 sets - 10 reps - 5 seconds hold - Shoulder External Rotation Walkouts with Anchored Resistance  - 1 x daily - 7 x weekly - 2 sets - 10 reps - 5 seconds hold - Shoulder Internal Rotation Reactive Isometrics  - 1 x daily - 7 x weekly - 2 sets - 10 reps - 5 seconds hold   Updated 05/11/2024 by Susannah Daring   - Wall Clock with Theraband  - 1 x daily - 7 x weekly - 2 sets - 10 reps - Wall Push Up with Plus  - 1 x daily - 7 x weekly - 2 sets - 10 reps - Standing Low Trap Setting with Resistance at Wall  - 1 x daily - 7 x weekly - 2 sets - 10 reps - Shoulder External Rotation and Scapular Retraction with Resistance  - 1 x daily - 7 x weekly - 2 sets - 10 reps - Standing Shoulder Single Arm PNF D2 Flexion with Resistance  - 1 x daily - 7 x weekly - 3 sets - 10 reps   Active shoulder exercises at counter using shelves: Pretend holding a mug or cup so your hand and shoulders today in the proper position. Flexion -stand facing the counter with your right foot close to the counter on the left foot slightly back.  With each of the activities you are going to weight shift from the back left leg to the front right leg while reaching up with the right hand and when lowering the hand to the counter you are going to shift back to the left leg. Set your posture then move the cup from the counter to the first shelf, reset your posture, lower the cup back  to the counter. Do 10 reps Set your posture then moved the cup from the counter to the first shelf, reset your posture moved the cup from the far shelf to the second shelf, reset your posture made the cut back down to the far shelf, reset your posture and move the cup back to the counter.  Do 10 reps Set your posture then moved the cup from the  counter to the second shelf in one motion, reset your posture and move the cup from the second shelf back to the counter.  Do 10 reps. 2.   Scaption -stand with your feet facing to the side with your right side beside the counter.  Your right foot should be positioned close to the counter and slightly forward compared to the left foot.  With each of the activities you are going to weight shift from the back leg to the front right leg while reaching up with the right hand and when lowering the hand back to the counter you will shift back to the left leg.  Your hand should say slightly in front of your shoulder. Set your posture then move the cup from the counter to the first shelf, reset your posture, lower the cup back to the counter. Do 10 reps Set your posture then moved the cup from the counter to the first shelf, reset your posture moved the cup from the far shelf to the second shelf, reset your posture made the cut back down to the far shelf, reset your posture and move the cup back to the counter.  Do 10 reps Set your posture then moved the cup from the counter to the second shelf in one motion, reset your posture and move the cup from the second shelf back to the counter.  Do 10 reps.    ASSESSMENT:  CLINICAL IMPRESSION: Patient arriving to session late this date, though noting no pain or soreness. Patient tolerated all activities this date with no increase in pain. PT updated HEP with 5 resistance activities and provided patient with yellow, red, and green TB. Patient will continue to benefit from skilled PT.  OBJECTIVE IMPAIRMENTS: decreased activity  tolerance, decreased endurance, decreased knowledge of condition, decreased ROM, decreased strength, increased edema, increased muscle spasms, impaired UE functional use, postural dysfunction, and pain.   ACTIVITY LIMITATIONS: carrying, lifting, sleeping, stairs, bathing, dressing, reach over head, hygiene/grooming, and computer for work.   PARTICIPATION LIMITATIONS: meal prep, laundry, driving, occupation, and yard work  PERSONAL FACTORS: Past/current experiences and 1-2 comorbidities: see PMH are also affecting patient's functional outcome.   REHAB POTENTIAL: Good  CLINICAL DECISION MAKING: Stable/uncomplicated  EVALUATION COMPLEXITY: Low   GOALS: Goals reviewed with patient? Yes  SHORT TERM GOALS: (target date for Short term goals 03/26/2024)  1.Patient will demonstrate independent use of home exercise program to maintain progress from in clinic treatments. Baseline: SEE OBJECTIVE DATA Goal status:  MET 03/27/2024  LONG TERM GOALS: (target dates for all long term goals 05/29/2024)   1. Patient will demonstrate/report pain at worst less than or equal to 2/10 to facilitate minimal limitation in daily activity secondary to pain symptoms. Baseline: SEE OBJECTIVE DATA Goal status:  Ongoing  04/01/2024   2. Patient will demonstrate independent use of home exercise program to facilitate ability to maintain/progress functional gains from skilled physical therapy services. Baseline: SEE OBJECTIVE DATA Goal status: Ongoing  04/01/2024   3.  Patient reports Patient-Specific Activity Score improved to >5 to indicate improvement in functional activities.  Baseline: SEE OBJECTIVE DATA Goal status: Ongoing  04/01/2024   4.  Patient will demonstrate right UE MMT 5/5 throughout to facilitate lifting, reaching, carrying at [facility] in daily activity.  Baseline: SEE OBJECTIVE DATA Goal status: Ongoing  04/01/2024   5.  Patient will demonstrate right GH joint AROM WFL s symptoms to facilitate usual  overhead reaching, self care, dressing at PLOF.  Baseline: SEE OBJECTIVE DATA  Goal status: Ongoing  04/01/2024   6.  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  Baseline: SEE OBJECTIVE DATA Goal status: Ongoing  04/01/2024    PLAN: PT FREQUENCY: 1x/wk for 4 weeks, then 2-3x/wk for 8 weeks  PT DURATION: 12 weeks  PLANNED INTERVENTIONS: 97164- PT Re-evaluation, 97110-Therapeutic exercises, 97530- Therapeutic activity, 97112- Neuromuscular re-education, 97535- Self Care, [id number]- Manual therapy, [id number]- Electrical stimulation (unattended), [id number]- Electrical stimulation (manual), 97016- Vasopneumatic device, 20560 (1-2 muscles), 20561 (3+ muscles)- Dry Needling, Patient/Family education, Taping, Scar mobilization, Cryotherapy, and Moist heat  PLAN FOR NEXT SESSION:  assess updated HEP    protocol in file box, past 8 weeks now.   PHYSICAL THERAPY DISCHARGE SUMMARY  Visits from Start of Care: 12  Current functional level related to goals / functional outcomes: See above    Remaining deficits: See above    Education / Equipment: HEP   Patient agrees to discharge. Patient goals were partially met. Patient is being discharged due to not returning since the last visit.   Susannah Daring, PT, DPT 05/11/24 9:41 AM      "

## 2024-05-11 ENCOUNTER — Ambulatory Visit (INDEPENDENT_AMBULATORY_CARE_PROVIDER_SITE_OTHER): Payer: Self-pay

## 2024-05-11 DIAGNOSIS — R293 Abnormal posture: Secondary | ICD-10-CM | POA: Diagnosis not present

## 2024-05-11 DIAGNOSIS — M6281 Muscle weakness (generalized): Secondary | ICD-10-CM | POA: Diagnosis not present

## 2024-05-11 DIAGNOSIS — M25611 Stiffness of right shoulder, not elsewhere classified: Secondary | ICD-10-CM | POA: Diagnosis not present

## 2024-05-11 DIAGNOSIS — M25511 Pain in right shoulder: Secondary | ICD-10-CM | POA: Diagnosis not present

## 2024-05-11 DIAGNOSIS — R600 Localized edema: Secondary | ICD-10-CM

## 2024-05-13 ENCOUNTER — Encounter: Admitting: Rehabilitative and Restorative Service Providers"

## 2024-05-20 ENCOUNTER — Ambulatory Visit (INDEPENDENT_AMBULATORY_CARE_PROVIDER_SITE_OTHER): Admitting: Orthopaedic Surgery

## 2024-05-20 DIAGNOSIS — G8929 Other chronic pain: Secondary | ICD-10-CM

## 2024-05-20 DIAGNOSIS — M25511 Pain in right shoulder: Secondary | ICD-10-CM

## 2024-05-20 NOTE — Progress Notes (Signed)
 Post Operative Evaluation    Procedure/Date of Surgery: Right shoulder arthroscopy with labral repair 6/10  Interval History:   Presents 12 weeks after above procedure.  Overall he is doing extremely well.  Denies any instability feelings.   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 Eda, 13 adenomatous polyps and 2 hyperplastic polyps. repeat in one yr   ESOPHAGOGASTRODUODENOSCOPY  07/18/2022   per Dr. Eda, reflux esophagitis, neg for H pylori. 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   VASECTOMY     Social History   Socioeconomic History   Marital status: Married    Spouse name: Not on file   Number of children: Not on file   Years of education: Not on file   Highest education level: Master's degree (e.g., MA, MS, MEng, MEd, MSW, MBA)  Occupational History   Not on file  Tobacco Use   Smoking status: Former    Current packs/day: 0.00    Types: Cigarettes    Quit date: 07/18/2017    Years since quitting: 6.8   Smokeless tobacco: Never   Tobacco comments:    5 CIGARETTES DAILY   Vaping Use   Vaping status: Never Used  Substance and Sexual Activity   Alcohol use: Yes    Alcohol/week: 4.0 standard drinks of alcohol    Types: 4 Cans of beer per week   Drug use: No   Sexual activity: Not on file  Other Topics Concern   Not on file  Social History Narrative   Not on file   Social Drivers of Health   Financial Resource Strain: Medium Risk (10/14/2023)   Overall Financial Resource Strain (CARDIA)    Difficulty of Paying Living Expenses:  Somewhat hard  Food Insecurity: No Food Insecurity (10/14/2023)   Hunger Vital Sign    Worried About Running Out of Food in the Last Year: Never true    Ran Out of Food in the Last Year: Never true  Transportation Needs: No Transportation Needs (10/14/2023)   PRAPARE - Administrator, Civil Service (Medical): No    Lack of Transportation (Non-Medical): No  Physical Activity: Insufficiently Active (10/14/2023)   Exercise Vital Sign    Days of Exercise per Week: 4 days    Minutes of Exercise per Session: 20 min  Stress: Stress Concern Present (10/14/2023)   [employer] of Occupational Health - Occupational Stress Questionnaire    Feeling of Stress : To some extent  Social Connections: Moderately Integrated (10/14/2023)   Social Connection and Isolation Panel    Frequency of Communication with Friends and Family: More than three times a week    Frequency of Social Gatherings with Friends and Family: Three times a week    Attends Religious Services: Never    Active Member of Clubs or Organizations: Yes    Attends Banker Meetings: 1 to 4 times per year    Marital Status:  Married   Family History  Problem Relation Age of Onset   Cancer Sister 8       rare tumor on her chest   Lung cancer Maternal Grandfather        smoked   Ovarian cancer Paternal Grandmother    Hyperlipidemia Other    Hypertension Other    Breast cancer Cousin        paternal first cousin   Leukemia Half-Brother 7       paternal half-brother   Colon cancer Neg Hx    Rectal cancer Neg Hx    Stomach cancer Neg Hx    Esophageal cancer Neg Hx    Allergies  Allergen Reactions   Doxycycline Hives    hives   Current Outpatient Medications  Medication Sig Dispense Refill   ALPRAZolam  (XANAX ) 0.5 MG tablet TAKE 1 TABLET(0.5 MG) BY MOUTH TWICE DAILY AS NEEDED FOR ANXIETY 60 tablet 5   AMBULATORY NON FORMULARY MEDICATION Medication Name: Digestive enzymes Probiotics, 3 gummies daily      amphetamine -dextroamphetamine  (ADDERALL) 10 MG tablet Take 1 tablet (10 mg total) by mouth 2 (two) times daily. 60 tablet 0   amphetamine -dextroamphetamine  (ADDERALL) 10 MG tablet Take 1 tablet (10 mg total) by mouth 2 (two) times daily. 60 tablet 0   amphetamine -dextroamphetamine  (ADDERALL) 10 MG tablet Take 1 tablet (10 mg total) by mouth 2 (two) times daily. 60 tablet 0   atorvastatin  (LIPITOR) 40 MG tablet Take 1 tablet (40 mg total) by mouth daily. 90 tablet 3   fenofibrate  160 MG tablet TAKE 1 TABLET(160 MG) BY MOUTH DAILY 90 tablet 3   fluticasone  (FLONASE ) 50 MCG/ACT nasal spray SHAKE LIQUID AND USE 2 SPRAYS IN EACH NOSTRIL DAILY 16 g 11   levocetirizine (XYZAL ) 5 MG tablet TAKE 1 TABLET(5 MG) BY MOUTH EVERY EVENING 30 tablet 11   losartan  (COZAAR ) 50 MG tablet TAKE 1 TABLET(50 MG) BY MOUTH DAILY 30 tablet 2   MAGNESIUM PO Take 250 mg by mouth daily at 12 noon.     metoprolol  succinate (TOPROL -XL) 100 MG 24 hr tablet Take 1 tablet (100 mg total) by mouth in the morning and at bedtime. Take with or immediately following a meal. 180 tablet 3   niacin  (NIASPAN ) 1000 MG CR tablet Take 1 tablet (1,000 mg total) by mouth at bedtime. 90 tablet 3   Omega-3 Fatty Acids (FISH OIL PO) Take by mouth daily.     pantoprazole  (PROTONIX ) 20 MG tablet Use Protonix  20 mg by mouth twice a day for 90 days #90 with 3 refills 90 tablet 3   sertraline  (ZOLOFT ) 50 MG tablet Take 1 tablet (50 mg total) by mouth daily. 90 tablet 3   temazepam  (RESTORIL ) 30 MG capsule TAKE 1 CAPSULE(30 MG) BY MOUTH AT BEDTIME 30 capsule 5   No current facility-administered medications for this visit.   No results found.  Review of Systems:   A ROS was performed including pertinent positives and negatives as documented in the HPI.   Musculoskeletal Exam:    There were no vitals taken for this visit.  Right shoulder incisions are well-appearing without erythema or drainage.  In the supine position he can forward elevate to 90  degrees with external rotation of the side to 30 degrees.  Distal neurosensory exam is intact  Imaging:      I personally reviewed and interpreted the radiographs.   Assessment:   12 weeks status post right shoulder arthroscopic labral repair overall doing extremely well.  He has no instability symptoms.  At this time he will get back to full activity I will see him back as needed  Plan :    - Return to clinic as needed      I personally saw and evaluated the patient, and participated in the management and treatment plan.  Elspeth Parker, MD Attending Physician, Orthopedic Surgery  This document was dictated using Dragon voice recognition software. A reasonable attempt at proof reading has been made to minimize errors.

## 2024-05-27 ENCOUNTER — Encounter (HOSPITAL_BASED_OUTPATIENT_CLINIC_OR_DEPARTMENT_OTHER): Admitting: Orthopaedic Surgery

## 2024-06-02 ENCOUNTER — Other Ambulatory Visit: Payer: Self-pay | Admitting: Family Medicine

## 2024-06-02 DIAGNOSIS — J310 Chronic rhinitis: Secondary | ICD-10-CM

## 2024-06-03 ENCOUNTER — Encounter (HOSPITAL_BASED_OUTPATIENT_CLINIC_OR_DEPARTMENT_OTHER): Admitting: Orthopaedic Surgery

## 2024-06-04 ENCOUNTER — Other Ambulatory Visit: Payer: Self-pay | Admitting: Family Medicine

## 2024-06-04 DIAGNOSIS — J310 Chronic rhinitis: Secondary | ICD-10-CM

## 2024-06-05 ENCOUNTER — Encounter: Payer: Self-pay | Admitting: Family Medicine

## 2024-06-05 ENCOUNTER — Telehealth: Admitting: Family Medicine

## 2024-06-05 VITALS — BP 151/104 | HR 69

## 2024-06-05 DIAGNOSIS — I10 Essential (primary) hypertension: Secondary | ICD-10-CM

## 2024-06-05 MED ORDER — LOSARTAN POTASSIUM-HCTZ 100-25 MG PO TABS: 1.0000 | ORAL_TABLET | Freq: Every day | ORAL | 2 refills | Status: DC

## 2024-06-05 NOTE — Progress Notes (Signed)
 Subjective:    Patient ID: Jordan Robles, male    DOB: 06-08-1981, 43 y.o.   MRN: [id number]  HPI Virtual Visit via Video Note  I connected with the patient on 06/05/24 at  9:45 AM EDT by a video enabled telemedicine application and verified that I am speaking with the correct person using two identifiers.  Location patient: home Location provider:work or home office Persons participating in the virtual visit: patient, provider  I discussed the limitations of evaluation and management by telemedicine and the availability of in person appointments. The patient expressed understanding and agreed to proceed.   HPI: Here to report high BP readings for the past month. He has been getting 150's and 160's over 100-110 most of the time. He feels fine.    ROS: See pertinent positives and negatives per HPI.  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 Eda, 13 adenomatous polyps and 2 hyperplastic polyps. repeat in one yr   ESOPHAGOGASTRODUODENOSCOPY  07/18/2022   per Dr. Eda, reflux esophagitis, neg for H pylori. 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   VASECTOMY      Family History  Problem Relation Age of Onset   Cancer Sister 8       rare tumor on her chest   Lung cancer Maternal Grandfather        smoked   Ovarian cancer Paternal Grandmother    Hyperlipidemia Other    Hypertension Other    Breast cancer Cousin        paternal first cousin   Leukemia Half-Brother 7       paternal half-brother   Colon cancer Neg Hx    Rectal cancer Neg Hx    Stomach cancer Neg Hx    Esophageal cancer Neg Hx      Current Outpatient Medications:     ALPRAZolam  (XANAX ) 0.5 MG tablet, TAKE 1 TABLET(0.5 MG) BY MOUTH TWICE DAILY AS NEEDED FOR ANXIETY, Disp: 60 tablet, Rfl: 5   AMBULATORY NON FORMULARY MEDICATION, Medication Name: Digestive enzymes Probiotics, 3 gummies daily, Disp: , Rfl:    amphetamine -dextroamphetamine  (ADDERALL) 10 MG tablet, Take 1 tablet (10 mg total) by mouth 2 (two) times daily., Disp: 60 tablet, Rfl: 0   amphetamine -dextroamphetamine  (ADDERALL) 10 MG tablet, Take 1 tablet (10 mg total) by mouth 2 (two) times daily., Disp: 60 tablet, Rfl: 0   amphetamine -dextroamphetamine  (ADDERALL) 10 MG tablet, Take 1 tablet (10 mg total) by mouth 2 (two) times daily., Disp: 60 tablet, Rfl: 0   atorvastatin  (LIPITOR) 40 MG tablet, Take 1 tablet (40 mg total) by mouth daily., Disp: 90 tablet, Rfl: 3   fenofibrate  160 MG tablet, TAKE 1 TABLET(160 MG) BY MOUTH DAILY, Disp: 90 tablet, Rfl: 3   fluticasone  (FLONASE ) 50 MCG/ACT nasal spray, SHAKE LIQUID AND USE 2 SPRAYS IN EACH NOSTRIL DAILY, Disp: 16 g, Rfl: 11   levocetirizine (XYZAL ) 5 MG tablet, TAKE 1 TABLET(5 MG) BY MOUTH EVERY EVENING, Disp: 30 tablet, Rfl: 11   losartan  (COZAAR ) 50 MG tablet, TAKE 1 TABLET(50 MG) BY MOUTH DAILY, Disp: 30 tablet, Rfl: 2   MAGNESIUM PO, Take 250 mg  by mouth daily at 12 noon., Disp: , Rfl:    metoprolol  succinate (TOPROL -XL) 100 MG 24 hr tablet, Take 1 tablet (100 mg total) by mouth in the morning and at bedtime. Take with or immediately following a meal., Disp: 180 tablet, Rfl: 3   niacin  (NIASPAN ) 1000 MG CR tablet, Take 1 tablet (1,000 mg total) by mouth at bedtime., Disp: 90 tablet, Rfl: 3   Omega-3 Fatty Acids (FISH OIL PO), Take by mouth daily., Disp: , Rfl:    pantoprazole  (PROTONIX ) 20 MG tablet, Use Protonix  20 mg by mouth twice a day for 90 days #90 with 3 refills, Disp: 90 tablet, Rfl: 3   sertraline  (ZOLOFT ) 50 MG tablet, Take 1 tablet (50 mg total) by mouth daily., Disp: 90 tablet, Rfl: 3   temazepam  (RESTORIL ) 30 MG capsule, TAKE 1 CAPSULE(30  MG) BY MOUTH AT BEDTIME, Disp: 30 capsule, Rfl: 5  EXAM:  VITALS per patient if applicable:  GENERAL: alert, oriented, appears well and in no acute distress  HEENT: atraumatic, conjunttiva clear, no obvious abnormalities on inspection of external nose and ears  NECK: normal movements of the head and neck  LUNGS: on inspection no signs of respiratory distress, breathing rate appears normal, no obvious gross SOB, gasping or wheezing  CV: no obvious cyanosis  MS: moves all visible extremities without noticeable abnormality  PSYCH/NEURO: pleasant and cooperative, no obvious depression or anxiety, speech and thought processing grossly intact  ASSESSMENT AND PLAN: HTN. We will stop the Losartan  and change to Losartan  HCT 100-25 once daily. Continue the Metoprolol . Report back in 3 weeks.  Jordan Olmsted, MD  Discussed the following assessment and plan:  No diagnosis found.     I discussed the assessment and treatment plan with the patient. The patient was provided an opportunity to ask questions and all were answered. The patient agreed with the plan and demonstrated an understanding of the instructions.   The patient was advised to call back or seek an in-person evaluation if the symptoms worsen or if the condition fails to improve as anticipated.      Review of Systems     Objective:   Physical Exam        Assessment & Plan:

## 2024-07-01 ENCOUNTER — Encounter: Payer: Self-pay | Admitting: Family Medicine

## 2024-07-01 ENCOUNTER — Encounter: Payer: Self-pay | Admitting: Gastroenterology

## 2024-07-01 ENCOUNTER — Telehealth: Payer: Self-pay

## 2024-07-01 ENCOUNTER — Other Ambulatory Visit: Payer: Self-pay

## 2024-07-01 MED ORDER — PANTOPRAZOLE SODIUM 20 MG PO TBEC: 20.0000 mg | DELAYED_RELEASE_TABLET | Freq: Every morning | ORAL | 1 refills | Status: DC

## 2024-07-01 MED ORDER — FAMOTIDINE 40 MG PO TABS: 40.0000 mg | ORAL_TABLET | Freq: Every evening | ORAL | 1 refills | Status: DC

## 2024-07-01 NOTE — Telephone Encounter (Signed)
 Communication continued with patient through My Chart. Patient does not answer the telephone.

## 2024-07-01 NOTE — Telephone Encounter (Signed)
 Hello Dr. Shila,  I'm currently taking pantoprazole  20 MG tablet twice daily. My refill is due but I would like to take 10MG  twice daily with a 90 day supply to try and reduce my dosage and not rebound. Is this possible? If so, the pharmacy I use is [facility] at [address]  Dearl you.    Received as patient message. I can schedule him to be seen to discuss.

## 2024-07-03 MED ORDER — AMPHETAMINE-DEXTROAMPHETAMINE 10 MG PO TABS: 10.0000 mg | ORAL_TABLET | Freq: Two times a day (BID) | ORAL | 0 refills | Status: AC

## 2024-07-03 MED ORDER — AMPHETAMINE-DEXTROAMPHETAMINE 10 MG PO TABS: 10.0000 mg | ORAL_TABLET | Freq: Two times a day (BID) | ORAL | 0 refills | Status: DC

## 2024-07-03 NOTE — Telephone Encounter (Signed)
 Done

## 2024-07-06 ENCOUNTER — Encounter: Payer: Self-pay | Admitting: Family Medicine

## 2024-07-06 NOTE — Telephone Encounter (Signed)
 [employer] [facility] is a free research program through [facility] that will run these types of tests on his blood sample. To enroll he can either go on the website at [email or web address] or call at [phone]

## 2024-07-07 NOTE — Telephone Encounter (Signed)
 The GeneConnect program is the only pharmacogenetic program we have, as far as I know

## 2024-07-08 ENCOUNTER — Other Ambulatory Visit: Payer: Self-pay | Admitting: Family Medicine

## 2024-07-10 ENCOUNTER — Telehealth: Payer: Self-pay

## 2024-07-10 NOTE — Telephone Encounter (Signed)
 Copied from CRM [id number]. Topic: Clinical - Request for Lab/Test Order >> Jul 08, 2024  3:08 PM Mia F wrote: Reason for CRM: Genetic testing is needed for pt. Megan from [place] is calling to provide details of how to submit the order for pt. Please call Duwaine back at is [phone]

## 2024-07-13 NOTE — Telephone Encounter (Signed)
 Left detailed message for Megan regarding how to place the genetic testing order on Epic, advised to contact the office back

## 2024-07-13 NOTE — Telephone Encounter (Signed)
 If you would please find out how to order this, I will be happy to do so

## 2024-07-14 ENCOUNTER — Telehealth: Payer: Self-pay | Admitting: *Deleted

## 2024-07-14 NOTE — Telephone Encounter (Signed)
 This Encounter is complete

## 2024-07-14 NOTE — Telephone Encounter (Signed)
 FYI Spoke with Megan from [facility], advise that she will email Dr Johnny the link to [facility] portal where the order can be placed.

## 2024-07-14 NOTE — Telephone Encounter (Signed)
 Copied from CRM #[id number]. Topic: Clinical - Request for Lab/Test Order >> Jul 14, 2024  1:14 PM Viola F wrote: Reason for CRM: Duwaine from [place] returned Nancy's phone call, she received the detailed message and says In[facility] had some questions for her. Please call her at [phone]

## 2024-07-17 ENCOUNTER — Encounter: Payer: Self-pay | Admitting: Family Medicine

## 2024-07-17 ENCOUNTER — Telehealth: Payer: Self-pay | Admitting: Family Medicine

## 2024-07-17 DIAGNOSIS — I10 Essential (primary) hypertension: Secondary | ICD-10-CM | POA: Diagnosis not present

## 2024-07-17 NOTE — Telephone Encounter (Signed)
 Copied from CRM [id number]. Topic: Clinical - Prescription Issue >> Jul 17, 2024  1:24 PM Larissa S wrote: Reason for CRM: Patient states he has been having an issue getting his Adderall medication filled due to it being out of stock. He states his pharmacy informed him that they have Adderall 10mg  capsule ext release and 20mg  in stock. He is requesting to have a prescription sent to his pharmacy for either of those and states he will be going out of town today. Patient has also sent a MyCHART message with his concern.   [id number] - [place], [place] - [address] AT [facility] [address] [place] [address] Phone: [phone] Fax: [phone] Hours: Open 24 hours

## 2024-07-20 ENCOUNTER — Encounter: Payer: Self-pay | Admitting: Radiology

## 2024-07-20 MED ORDER — AMPHETAMINE-DEXTROAMPHET ER 10 MG PO CP24: 10.0000 mg | ORAL_CAPSULE | Freq: Every day | ORAL | 0 refills | Status: DC

## 2024-07-20 NOTE — Telephone Encounter (Signed)
 I sent in one month of the 10 mg XR

## 2024-07-20 NOTE — Telephone Encounter (Signed)
 I sent in ine month of 10 mg XR

## 2024-07-20 NOTE — Telephone Encounter (Signed)
 Pt notified via MyChart.

## 2024-07-22 ENCOUNTER — Telehealth: Payer: Self-pay

## 2024-07-22 NOTE — Telephone Encounter (Signed)
 Copied from CRM [id number]. Topic: General - Other >> Jul 22, 2024 12:48 PM Roselie BROCKS wrote: Reason for CRM: Duwaine at [place] calling for [place] concerning patient. Please return call . disconnected while on hold

## 2024-07-23 NOTE — Telephone Encounter (Signed)
 Left a message for Jordan Robles advised to call the office back regarding this pt

## 2024-07-29 NOTE — Telephone Encounter (Signed)
 No success speaking with an agent regarding this encounter

## 2024-08-05 NOTE — Progress Notes (Signed)
 Cardiology Office Note:    Date:  08/11/2024   ID:  Jordan Robles, DOB 04/07/1981, MRN [id number]  PCP:  Johnny Garnette LABOR, MD   [facility] Providers Cardiologist:  None     Referring MD: Johnny Garnette LABOR, MD   Chief Complaint  Patient presents with   Hypertension   Hyperlipidemia    History of Present Illness:    Jordan Robles is a 43 y.o. male is self referred for CV evaluation given family history of CAD. He has a history of HTN and HLD. He states he has a long history of elevated triglycerides. Is on fish oil, fenofibrate , lipitor and niacin . Does have occasional flushing with niacin . Denies any chest pain, dyspnea or palpitations. 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 Eda, 13 adenomatous polyps and 2 hyperplastic polyps. repeat in one yr   ESOPHAGOGASTRODUODENOSCOPY  07/18/2022   per Dr. Eda, reflux esophagitis, neg for H pylori. 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   SHOULDER SURGERY Right    VASECTOMY      Current Medications: Current Meds  Medication Sig   ALPRAZolam  (XANAX ) 0.5 MG tablet TAKE 1 TABLET(0.5 MG) BY MOUTH TWICE DAILY AS NEEDED FOR ANXIETY   AMBULATORY NON FORMULARY MEDICATION Medication Name: Digestive enzymes Probiotics, 3 gummies daily   amphetamine -dextroamphetamine  (ADDERALL XR) 10 MG 24 hr capsule Take 1 capsule (10 mg total) by mouth daily.   amphetamine -dextroamphetamine  (ADDERALL) 10 MG tablet Take 1 tablet (10 mg total) by mouth 2 (two) times daily.   amphetamine -dextroamphetamine  (ADDERALL) 10 MG tablet Take 1 tablet (10 mg total)  by mouth 2 (two) times daily.   amphetamine -dextroamphetamine  (ADDERALL) 10 MG tablet Take 1 tablet (10 mg total) by mouth 2 (two) times daily.   atorvastatin  (LIPITOR) 40 MG tablet Take 1 tablet (40 mg total) by mouth daily.   famotidine  (PEPCID ) 40 MG tablet Take 1 tablet (40 mg total) by mouth every evening.   fenofibrate  160 MG tablet TAKE 1 TABLET(160 MG) BY MOUTH DAILY   fluticasone  (FLONASE ) 50 MCG/ACT nasal spray SHAKE LIQUID AND USE 2 SPRAYS IN EACH NOSTRIL DAILY   levocetirizine (XYZAL ) 5 MG tablet TAKE 1 TABLET(5 MG) BY MOUTH EVERY EVENING   losartan -hydrochlorothiazide (HYZAAR) 100-25 MG tablet TAKE 1 TABLET BY MOUTH DAILY   MAGNESIUM PO Take 250 mg by mouth daily at 12 noon.   metoprolol  succinate (TOPROL -XL) 100 MG 24 hr tablet Take 1 tablet (100 mg total) by mouth in the morning and at bedtime. Take with or immediately following a meal.   niacin  (NIASPAN ) 1000 MG CR tablet Take 1 tablet (1,000 mg total) by mouth at bedtime.   Omega-3 Fatty Acids (FISH OIL PO) Take by mouth daily.   pantoprazole  (PROTONIX ) 20 MG tablet Take 1 tablet (20 mg total) by mouth every morning.   sertraline  (ZOLOFT ) 50 MG tablet Take 1 tablet (50 mg total) by mouth daily.   temazepam  (RESTORIL ) 30 MG capsule TAKE 1 CAPSULE(30 MG) BY MOUTH AT BEDTIME     Allergies:   Doxycycline  Social History   Socioeconomic History   Marital status: Married    Spouse name: Not on file   Number of children: 0   Years of education: Not on file   Highest education level: Master's degree (e.g., MA, MS, MEng, MEd, MSW, MBA)  Occupational History   Not on file  Tobacco Use   Smoking status: Former    Current packs/day: 0.00    Types: Cigarettes    Quit date: 07/18/2017    Years since quitting: 7.0   Smokeless tobacco: Never   Tobacco comments:    5 CIGARETTES DAILY   Vaping Use   Vaping status: Never Used  Substance and Sexual Activity   Alcohol use: Yes    Alcohol/week: 4.0 standard drinks of alcohol     Types: 4 Cans of beer per week   Drug use: No   Sexual activity: Not on file  Other Topics Concern   Not on file  Social History Narrative   IT emergency planning/management officer   Social Drivers of Health   Financial Resource Strain: Low Risk  (06/02/2024)   Overall Financial Resource Strain (CARDIA)    Difficulty of Paying Living Expenses: Not very hard  Food Insecurity: No Food Insecurity (06/02/2024)   Hunger Vital Sign    Worried About Running Out of Food in the Last Year: Never true    Ran Out of Food in the Last Year: Never true  Transportation Needs: No Transportation Needs (06/02/2024)   PRAPARE - Administrator, Civil Service (Medical): No    Lack of Transportation (Non-Medical): No  Physical Activity: Insufficiently Active (06/02/2024)   Exercise Vital Sign    Days of Exercise per Week: 4 days    Minutes of Exercise per Session: 30 min  Stress: Stress Concern Present (06/02/2024)   [employer] of Occupational Health - Occupational Stress Questionnaire    Feeling of Stress: Rather much  Social Connections: Socially Integrated (06/02/2024)   Social Connection and Isolation Panel    Frequency of Communication with Friends and Family: More than three times a week    Frequency of Social Gatherings with Friends and Family: Three times a week    Attends Religious Services: 1 to 4 times per year    Active Member of Clubs or Organizations: Yes    Attends Engineer, Structural: More than 4 times per year    Marital Status: Married     Family History: The patient's family history includes Breast cancer in his cousin; Cancer (age of onset: 39) in his sister; Heart attack in his paternal grandfather; Heart disease (age of onset: 40) in his paternal grandfather; Hyperlipidemia in an other family member; Hypertension in an other family member; Leukemia (age of onset: 28) in his half-brother; Lung cancer in his maternal grandfather; Ovarian cancer in his paternal grandmother. There is  no history of Colon cancer, Rectal cancer, Stomach cancer, or Esophageal cancer.  ROS:   Please see the history of present illness.     All other systems reviewed and are negative.  EKGs/Labs/Other Studies Reviewed:    The following studies were reviewed today: EKG Interpretation Date/Time:  Tuesday August 11 2024 09:33:41 EST Ventricular Rate:  57 PR Interval:  152 QRS Duration:  88 QT Interval:  418 QTC Calculation: 406 R Axis:   25  Text Interpretation: Sinus bradycardia ECG OTHERWISE WITHIN NORMAL LIMITS No previous ECGs available Confirmed by Janice Seales [phone]) on 08/11/2024 9:40:20 AM   EKG Interpretation Date/Time:  Tuesday August 11 2024 09:33:41 EST Ventricular Rate:  57 PR Interval:  152 QRS Duration:  88 QT Interval:  418 QTC Calculation: 406 R Axis:   25  Text Interpretation: Sinus bradycardia ECG OTHERWISE WITHIN NORMAL LIMITS No previous ECGs available Confirmed by Tramya Schoenfelder [phone]) on 08/11/2024 9:40:20 AM    Recent Labs: 10/18/2023: ALT 33; BUN 13; Creatinine, Ser 1.00; Hemoglobin 16.2; Platelets 358.0; Potassium 4.5; Sodium 141; TSH 3.20  Recent Lipid Panel    Component Value Date/Time   CHOL 152 10/18/2023 1046   TRIG 237.0 (H) 10/18/2023 1046   TRIG 249 (HH) 09/12/2006 0824   HDL 65.90 10/18/2023 1046   CHOLHDL 2 10/18/2023 1046   VLDL 47.4 (H) 10/18/2023 1046   LDLCALC 39 10/18/2023 1046   LDLDIRECT 79.0 10/18/2022 0948     Risk Assessment/Calculations:                Physical Exam:    VS:  BP 118/82 (BP Location: Left Arm, Patient Position: Sitting, Cuff Size: Normal)   Pulse (!) 57   Ht 5' 8 (1.727 m)   Wt 166 lb 9.6 oz (75.6 kg)   SpO2 98%   BMI 25.33 kg/m     Wt Readings from Last 3 Encounters:  08/11/24 166 lb 9.6 oz (75.6 kg)  10/25/23 165 lb (74.8 kg)  10/18/23 173 lb 9.6 oz (78.7 kg)     GEN:  Well nourished, well developed in no acute distress HEENT: Normal NECK: No JVD; No carotid bruits LYMPHATICS: No  lymphadenopathy CARDIAC: RRR, no murmurs, rubs, gallops RESPIRATORY:  Clear to auscultation without rales, wheezing or rhonchi  ABDOMEN: Soft, non-tender, non-distended MUSCULOSKELETAL:  No edema; No deformity  SKIN: Warm and dry NEUROLOGIC:  Alert and oriented x 3 PSYCHIATRIC:  Normal affect   ASSESSMENT:    1. Primary hypertension    PLAN:    In order of problems listed above:  CV risk. Risk factors of HLD, HTN and prior tobacco abuse. Suggest a coronary calcium  score to further stratify risk. If he has calcification would add ASA. May need stress test if high score. HLD on good therapy. Low LDL and high HDL. Suggest stopping niacin  since really no proven benefit.  HTN well controlled.            Medication Adjustments/Labs and Tests Ordered: Current medicines are reviewed at length with the patient today.  Concerns regarding medicines are outlined above.  Orders Placed This Encounter  Procedures   EKG 12-Lead   No orders of the defined types were placed in this encounter.   Patient Instructions  Medication Instructions:   *If you need a refill on your cardiac medications before your next appointment, please call your pharmacy*  Lab Work:   Testing/Procedures:   Follow-Up: At [facility], you and your health needs are our priority.  As part of our continuing mission to provide you with exceptional heart care, our providers are all part of one team.  This team includes your primary Cardiologist (physician) and Advanced Practice Providers or APPs (Physician Assistants and Nurse Practitioners) who all work together to provide you with the care you need, when you need it.  Your next appointment:      Provider:      We recommend signing up for the patient portal called MyChart.  Sign up information is provided on this After Visit Summary.  MyChart is used to connect with patients for Virtual Visits (Telemedicine).  Patients are able to  view lab/test  results, encounter notes, upcoming appointments, etc.  Non-urgent messages can be sent to your provider as well.   To learn more about what you can do with MyChart, go to [email or web address].      Signed, Mckinzey Entwistle, MD  08/11/2024 9:53 AM     [facility]

## 2024-08-11 ENCOUNTER — Ambulatory Visit (HOSPITAL_COMMUNITY)
Admission: RE | Admit: 2024-08-11 | Discharge: 2024-08-11 | Disposition: A | Discharge status: Home / Self Care | Payer: Self-pay | Source: Ambulatory Visit | Attending: Internal Medicine | Admitting: Internal Medicine

## 2024-08-11 ENCOUNTER — Encounter: Payer: Self-pay | Admitting: Cardiology

## 2024-08-11 ENCOUNTER — Ambulatory Visit: Payer: Self-pay | Admitting: Cardiology

## 2024-08-11 ENCOUNTER — Encounter: Payer: Self-pay | Admitting: Family Medicine

## 2024-08-11 ENCOUNTER — Ambulatory Visit: Attending: Cardiology | Admitting: Cardiology

## 2024-08-11 VITALS — BP 118/82 | HR 57 | Ht 68.0 in | Wt 166.6 lb

## 2024-08-11 DIAGNOSIS — I10 Essential (primary) hypertension: Secondary | ICD-10-CM

## 2024-08-11 DIAGNOSIS — E782 Mixed hyperlipidemia: Secondary | ICD-10-CM

## 2024-08-11 NOTE — Patient Instructions (Addendum)
 Medication Instructions:  Continue same medications  Lab Work: None ordered  Testing/Procedures: Coronary Calcium  Score   Follow-Up: At [facility], you and your health needs are our priority.  As part of our continuing mission to provide you with exceptional heart care, our providers are all part of one team.  This team includes your primary Cardiologist (physician) and Advanced Practice Providers or APPs (Physician Assistants and Nurse Practitioners) who all work together to provide you with the care you need, when you need it.  Your next appointment:  To Be Determined    Provider:  Dr.Jordan    We recommend signing up for the patient portal called MyChart.  Sign up information is provided on this After Visit Summary.  MyChart is used to connect with patients for Virtual Visits (Telemedicine).  Patients are able to view lab/test results, encounter notes, upcoming appointments, etc.  Non-urgent messages can be sent to your provider as well.   To learn more about what you can do with MyChart, go to [email or web address].

## 2024-08-12 NOTE — Telephone Encounter (Signed)
 Please advise

## 2024-08-12 NOTE — Telephone Encounter (Signed)
 Yes he can stop it completely. No need to taper it

## 2024-08-19 ENCOUNTER — Other Ambulatory Visit: Payer: Self-pay | Admitting: Family Medicine

## 2024-08-19 NOTE — Progress Notes (Unsigned)
 Chief Complaint: Follow-up GERD  HPI:    Jordan Robles is a 43 year old male with a past medical history as listed below including GERD, known to Dr. Shila, who presents to clinic today for follow-up of his GERD and pantoprazole  refill.    11/23 colonoscopy with 14 tubular adenomas removed, genetic testing negative, surveillance due 11/24.  Also an EGD with LA class D reflux esophagitis.  Repeat EGD recommended 11/24.    12/03/2022 at that time recommended to repeat EGD and colonoscopy in November.    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 Eda, 13 adenomatous polyps and 2 hyperplastic polyps. repeat in one yr   ESOPHAGOGASTRODUODENOSCOPY  07/18/2022   per Dr. Eda, reflux esophagitis, neg for H pylori. 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   SHOULDER SURGERY Right    VASECTOMY      Current Outpatient Medications  Medication Sig Dispense Refill   ALPRAZolam  (XANAX ) 0.5 MG tablet TAKE 1 TABLET(0.5 MG) BY MOUTH TWICE DAILY AS NEEDED FOR ANXIETY 60 tablet 5   AMBULATORY NON FORMULARY MEDICATION Medication Name: Digestive enzymes Probiotics, 3 gummies daily     amphetamine -dextroamphetamine  (ADDERALL XR) 10 MG 24 hr capsule Take 1 capsule (10 mg total) by mouth daily. 30 capsule 0   amphetamine -dextroamphetamine  (ADDERALL) 10 MG tablet Take 1 tablet (10 mg total) by mouth 2 (two) times daily. 60 tablet 0   amphetamine -dextroamphetamine  (ADDERALL) 10 MG tablet Take 1 tablet (10 mg total) by mouth 2 (two) times daily. 60 tablet 0   amphetamine -dextroamphetamine  (ADDERALL) 10 MG tablet Take 1 tablet (10 mg total) by mouth 2 (two) times  daily. 60 tablet 0   atorvastatin  (LIPITOR) 40 MG tablet Take 1 tablet (40 mg total) by mouth daily. 90 tablet 3   famotidine  (PEPCID ) 40 MG tablet Take 1 tablet (40 mg total) by mouth every evening. 90 tablet 1   fenofibrate  160 MG tablet TAKE 1 TABLET(160 MG) BY MOUTH DAILY 90 tablet 3   fluticasone  (FLONASE ) 50 MCG/ACT nasal spray SHAKE LIQUID AND USE 2 SPRAYS IN EACH NOSTRIL DAILY 16 g 11   levocetirizine (XYZAL ) 5 MG tablet TAKE 1 TABLET(5 MG) BY MOUTH EVERY EVENING 90 tablet 1   losartan -hydrochlorothiazide (HYZAAR) 100-25 MG tablet TAKE 1 TABLET BY MOUTH DAILY 30 tablet 2   MAGNESIUM PO Take 250 mg by mouth daily at 12 noon.     metoprolol  succinate (TOPROL -XL) 100 MG 24 hr tablet Take 1 tablet (100 mg total) by mouth in the morning and at bedtime. Take with or immediately following a meal. 180 tablet 3   niacin  (NIASPAN ) 1000 MG CR tablet Take 1 tablet (1,000 mg total) by mouth at bedtime. 90 tablet 3   Omega-3 Fatty Acids (FISH OIL PO) Take by mouth daily.     pantoprazole  (PROTONIX ) 20 MG tablet Take 1 tablet (20 mg total) by mouth every morning. 90 tablet 1   sertraline  (ZOLOFT ) 50 MG tablet Take 1 tablet (50 mg total) by mouth daily. 90 tablet 3   temazepam  (RESTORIL ) 30 MG capsule TAKE 1 CAPSULE(30 MG) BY MOUTH AT BEDTIME 30 capsule  5   No current facility-administered medications for this visit.    Allergies as of 08/20/2024 - Review Complete 08/11/2024  Allergen Reaction Noted   Doxycycline Hives 03/14/2015    Family History  Problem Relation Age of Onset   Cancer Sister 8       rare tumor on her chest   Lung cancer Maternal Grandfather        smoked   Ovarian cancer Paternal Grandmother    Heart attack Paternal Grandfather    Heart disease Paternal Grandfather 39   Breast cancer Cousin        paternal first cousin   Leukemia Half-Brother 7       paternal half-brother   Hyperlipidemia Other    Hypertension Other    Colon cancer Neg Hx    Rectal cancer Neg Hx     Stomach cancer Neg Hx    Esophageal cancer Neg Hx     Social History   Socioeconomic History   Marital status: Married    Spouse name: Not on file   Number of children: 0   Years of education: Not on file   Highest education level: Master's degree (e.g., MA, MS, MEng, MEd, MSW, MBA)  Occupational History   Not on file  Tobacco Use   Smoking status: Former    Current packs/day: 0.00    Types: Cigarettes    Quit date: 07/18/2017    Years since quitting: 7.0   Smokeless tobacco: Never   Tobacco comments:    5 CIGARETTES DAILY   Vaping Use   Vaping status: Never Used  Substance and Sexual Activity   Alcohol use: Yes    Alcohol/week: 4.0 standard drinks of alcohol    Types: 4 Cans of beer per week   Drug use: No   Sexual activity: Not on file  Other Topics Concern   Not on file  Social History Narrative   IT emergency planning/management officer   Social Drivers of Health   Financial Resource Strain: Low Risk  (06/02/2024)   Overall Financial Resource Strain (CARDIA)    Difficulty of Paying Living Expenses: Not very hard  Food Insecurity: No Food Insecurity (06/02/2024)   Hunger Vital Sign    Worried About Running Out of Food in the Last Year: Never true    Ran Out of Food in the Last Year: Never true  Transportation Needs: No Transportation Needs (06/02/2024)   PRAPARE - Administrator, Civil Service (Medical): No    Lack of Transportation (Non-Medical): No  Physical Activity: Insufficiently Active (06/02/2024)   Exercise Vital Sign    Days of Exercise per Week: 4 days    Minutes of Exercise per Session: 30 min  Stress: Stress Concern Present (06/02/2024)   [employer] of Occupational Health - Occupational Stress Questionnaire    Feeling of Stress: Rather much  Social Connections: Socially Integrated (06/02/2024)   Social Connection and Isolation Panel    Frequency of Communication with Friends and Family: More than three times a week    Frequency of Social Gatherings with  Friends and Family: Three times a week    Attends Religious Services: 1 to 4 times per year    Active Member of Clubs or Organizations: Yes    Attends Engineer, Structural: More than 4 times per year    Marital Status: Married  Catering Manager Violence: Not on file    Review of Systems:    Constitutional: No weight loss, fever, chills, weakness  or fatigue HEENT: Eyes: No change in vision               Ears, Nose, Throat:  No change in hearing or congestion Skin: No rash or itching Cardiovascular: No chest pain, chest pressure or palpitations   Respiratory: No SOB or cough Gastrointestinal: See HPI and otherwise negative Genitourinary: No dysuria or change in urinary frequency Neurological: No headache, dizziness or syncope Musculoskeletal: No new muscle or joint pain Hematologic: No bleeding or bruising Psychiatric: No history of depression or anxiety    Physical Exam:  Vital signs: There were no vitals taken for this visit.  Constitutional:   Pleasant Caucasian male appears to be in NAD, Well developed, Well nourished, alert and cooperative Head:  Normocephalic and atraumatic. Eyes:   PEERL, EOMI. No icterus. Conjunctiva pink. Ears:  Normal auditory acuity. Neck:  Supple Throat: Oral cavity and pharynx without inflammation, swelling or lesion.  Respiratory: Respirations even and unlabored. Lungs clear to auscultation bilaterally.   No wheezes, crackles, or rhonchi.  Cardiovascular: Normal S1, S2. No MRG. Regular rate and rhythm. No peripheral edema, cyanosis or pallor.  Gastrointestinal:  Soft, nondistended, nontender. No rebound or guarding. Normal bowel sounds. No appreciable masses or hepatomegaly. Rectal:  Not performed.  Msk:  Symmetrical without gross deformities. Without edema, no deformity or joint abnormality.  Neurologic:  Alert and  oriented x4;  grossly normal neurologically.  Skin:   Dry and intact without significant lesions or rashes. Psychiatric:  Oriented to person, place and time. Demonstrates good judgement and reason without abnormal affect or behaviors.  RELEVANT LABS AND IMAGING: CBC    Component Value Date/Time   WBC 10.3 10/18/2023 1046   RBC 5.04 10/18/2023 1046   HGB 16.2 10/18/2023 1046   HCT 49.0 10/18/2023 1046   PLT 358.0 10/18/2023 1046   MCV 97.3 10/18/2023 1046   MCHC 33.1 10/18/2023 1046   RDW 13.5 10/18/2023 1046   LYMPHSABS 2.1 10/18/2023 1046   MONOABS 0.7 10/18/2023 1046   EOSABS 0.1 10/18/2023 1046   BASOSABS 0.1 10/18/2023 1046    CMP     Component Value Date/Time   NA 141 10/18/2023 1046   K 4.5 10/18/2023 1046   CL 103 10/18/2023 1046   CO2 27 10/18/2023 1046   GLUCOSE 90 10/18/2023 1046   BUN 13 10/18/2023 1046   CREATININE 1.00 10/18/2023 1046   CALCIUM  9.3 10/18/2023 1046   PROT 6.8 10/18/2023 1046   ALBUMIN 4.7 10/18/2023 1046   AST 38 (H) 10/18/2023 1046   ALT 33 10/18/2023 1046   ALKPHOS 31 (L) 10/18/2023 1046   BILITOT 0.8 10/18/2023 1046   GFRNONAA 77.66 07/28/2010 1023   GFRAA 102 07/06/2008 0831    Assessment: 1. ***  Plan: 1. ***     Jordan Failing, PA-C [facility] 08/19/2024, 10:23 AM  Cc: Johnny Garnette LABOR, MD

## 2024-08-19 NOTE — Telephone Encounter (Signed)
 Pt LVV was on 06/05/24 Last refill was done on 01/16/24 Please advise

## 2024-08-20 ENCOUNTER — Ambulatory Visit: Admitting: Physician Assistant

## 2024-08-20 ENCOUNTER — Encounter: Payer: Self-pay | Admitting: Physician Assistant

## 2024-08-20 VITALS — BP 122/88 | HR 101 | Ht 68.0 in | Wt 171.0 lb

## 2024-08-20 DIAGNOSIS — Z860101 Personal history of adenomatous and serrated colon polyps: Secondary | ICD-10-CM | POA: Diagnosis not present

## 2024-08-20 DIAGNOSIS — K219 Gastro-esophageal reflux disease without esophagitis: Secondary | ICD-10-CM | POA: Diagnosis not present

## 2024-08-20 DIAGNOSIS — Z860100 Personal history of colon polyps, unspecified: Secondary | ICD-10-CM

## 2024-08-20 MED ORDER — PANTOPRAZOLE SODIUM 20 MG PO TBEC: 20.0000 mg | DELAYED_RELEASE_TABLET | Freq: Every morning | ORAL | 3 refills | Status: AC

## 2024-08-20 MED ORDER — FAMOTIDINE 40 MG PO TABS: 40.0000 mg | ORAL_TABLET | Freq: Every evening | ORAL | 3 refills | Status: AC

## 2024-08-20 NOTE — Patient Instructions (Signed)
 We have sent the following medications to your pharmacy for you to pick up at your convenience: Pantoprazole  20 mg and Famotidine  40 mg

## 2024-09-11 ENCOUNTER — Other Ambulatory Visit: Payer: Self-pay | Admitting: Family Medicine

## 2024-09-16 ENCOUNTER — Telehealth: Payer: Self-pay

## 2024-09-16 ENCOUNTER — Other Ambulatory Visit (HOSPITAL_COMMUNITY): Payer: Self-pay

## 2024-09-16 NOTE — Telephone Encounter (Signed)
 Pharmacy Patient Advocate Encounter   Received notification from Onbase that prior authorization for Fenofibrate  160MG  tablets is required/requested.   Insurance verification completed.   The patient is insured through [employer].   Per test claim: PA required; PA submitted to above mentioned insurance via Latent Key/confirmation #/EOC [id number] Status is pending

## 2024-09-18 ENCOUNTER — Other Ambulatory Visit (HOSPITAL_COMMUNITY): Payer: Self-pay

## 2024-09-18 NOTE — Telephone Encounter (Signed)
 Pharmacy Patient Advocate Encounter  Received notification from [employer] that Prior Authorization for Fenofibrate  160MG   has been APPROVED from 09/16/24 to 09/16/25   PA #/Case ID/Reference #: [id number]

## 2024-10-21 ENCOUNTER — Encounter: Payer: Self-pay | Admitting: Family Medicine

## 2024-10-21 ENCOUNTER — Ambulatory Visit: Admitting: Family Medicine

## 2024-10-21 VITALS — BP 110/74 | HR 68 | Temp 98.4°F | Ht 68.0 in | Wt 167.0 lb

## 2024-10-21 DIAGNOSIS — Z209 Contact with and (suspected) exposure to unspecified communicable disease: Secondary | ICD-10-CM

## 2024-10-21 DIAGNOSIS — Z0000 Encounter for general adult medical examination without abnormal findings: Secondary | ICD-10-CM | POA: Diagnosis not present

## 2024-10-21 DIAGNOSIS — Z131 Encounter for screening for diabetes mellitus: Secondary | ICD-10-CM | POA: Diagnosis not present

## 2024-10-21 DIAGNOSIS — Z13220 Encounter for screening for lipoid disorders: Secondary | ICD-10-CM | POA: Diagnosis not present

## 2024-10-21 DIAGNOSIS — Z23 Encounter for immunization: Secondary | ICD-10-CM | POA: Diagnosis not present

## 2024-10-21 LAB — CBC WITH DIFFERENTIAL/PLATELET
Basophils Absolute: 0.1 10*3/uL (ref 0.0–0.1)
Basophils Relative: 0.8 % (ref 0.0–3.0)
Eosinophils Absolute: 0.1 10*3/uL (ref 0.0–0.7)
Eosinophils Relative: 0.9 % (ref 0.0–5.0)
HCT: 44 % (ref 39.0–52.0)
Hemoglobin: 14.7 g/dL (ref 13.0–17.0)
Lymphocytes Relative: 15.6 % (ref 12.0–46.0)
Lymphs Abs: 1.8 10*3/uL (ref 0.7–4.0)
MCHC: 33.4 g/dL (ref 30.0–36.0)
MCV: 97 fl (ref 78.0–100.0)
Monocytes Absolute: 0.8 10*3/uL (ref 0.1–1.0)
Monocytes Relative: 6.5 % (ref 3.0–12.0)
Neutro Abs: 9.1 10*3/uL — ABNORMAL HIGH (ref 1.4–7.7)
Neutrophils Relative %: 76.2 % (ref 43.0–77.0)
Platelets: 381 10*3/uL (ref 150.0–400.0)
RBC: 4.53 Mil/uL (ref 4.22–5.81)
RDW: 13 % (ref 11.5–15.5)
WBC: 11.9 10*3/uL — ABNORMAL HIGH (ref 4.0–10.5)

## 2024-10-21 LAB — LIPID PANEL
Cholesterol: 142 mg/dL (ref 28–200)
HDL: 65.9 mg/dL
LDL Cholesterol: 42 mg/dL (ref 10–99)
NonHDL: 76.17
Total CHOL/HDL Ratio: 2
Triglycerides: 173 mg/dL — ABNORMAL HIGH (ref 10.0–149.0)
VLDL: 34.6 mg/dL (ref 0.0–40.0)

## 2024-10-21 LAB — BASIC METABOLIC PANEL WITH GFR
BUN: 13 mg/dL (ref 6–23)
CO2: 27 meq/L (ref 19–32)
Calcium: 9.7 mg/dL (ref 8.4–10.5)
Chloride: 99 meq/L (ref 96–112)
Creatinine, Ser: 1.01 mg/dL (ref 0.40–1.50)
GFR: 89.5 mL/min
Glucose, Bld: 84 mg/dL (ref 70–99)
Potassium: 3.9 meq/L (ref 3.5–5.1)
Sodium: 139 meq/L (ref 135–145)

## 2024-10-21 LAB — HEPATIC FUNCTION PANEL
ALT: 28 U/L (ref 3–53)
AST: 35 U/L (ref 5–37)
Albumin: 4.7 g/dL (ref 3.5–5.2)
Alkaline Phosphatase: 25 U/L — ABNORMAL LOW (ref 39–117)
Bilirubin, Direct: 0.2 mg/dL (ref 0.1–0.3)
Total Bilirubin: 0.7 mg/dL (ref 0.2–1.2)
Total Protein: 7.5 g/dL (ref 6.0–8.3)

## 2024-10-21 LAB — TSH: TSH: 3.68 u[IU]/mL (ref 0.35–5.50)

## 2024-10-21 LAB — HEMOGLOBIN A1C: Hgb A1c MFr Bld: 5.3 % (ref 4.6–6.5)

## 2024-10-21 MED ORDER — METOPROLOL SUCCINATE ER 100 MG PO TB24: 100.0000 mg | ORAL_TABLET | Freq: Two times a day (BID) | ORAL | 3 refills | Status: AC

## 2024-10-21 MED ORDER — AMPHETAMINE-DEXTROAMPHET ER 10 MG PO CP24: 10.0000 mg | ORAL_CAPSULE | Freq: Every day | ORAL | 0 refills | Status: AC

## 2024-10-21 NOTE — Progress Notes (Signed)
 "  Subjective:    Patient ID: Jordan Robles, male    DOB: 1980-10-27, 44 y.o.   MRN: [id number]  HPI Here for a well exam. He feels well. His BP has been stable. He had a cardiac CT calcium  test last November which showed a score of zero.    Review of Systems  Constitutional: Negative.   HENT: Negative.    Eyes: Negative.   Respiratory: Negative.    Cardiovascular: Negative.   Gastrointestinal: Negative.   Genitourinary: Negative.   Musculoskeletal: Negative.   Skin: Negative.   Neurological: Negative.   Psychiatric/Behavioral: Negative.         Objective:   Physical Exam Constitutional:      General: He is not in acute distress.    Appearance: Normal appearance. He is well-developed. He is not diaphoretic.  HENT:     Head: Normocephalic and atraumatic.     Right Ear: External ear normal.     Left Ear: External ear normal.     Nose: Nose normal.     Mouth/Throat:     Pharynx: No oropharyngeal exudate.  Eyes:     General: No scleral icterus.       Right eye: No discharge.        Left eye: No discharge.     Conjunctiva/sclera: Conjunctivae normal.     Pupils: Pupils are equal, round, and reactive to light.  Neck:     Thyroid : No thyromegaly.     Vascular: No JVD.     Trachea: No tracheal deviation.  Cardiovascular:     Rate and Rhythm: Normal rate and regular rhythm.     Pulses: Normal pulses.     Heart sounds: Normal heart sounds. No murmur heard.    No friction rub. No gallop.  Pulmonary:     Effort: Pulmonary effort is normal. No respiratory distress.     Breath sounds: Normal breath sounds. No wheezing or rales.  Chest:     Chest wall: No tenderness.  Abdominal:     General: Bowel sounds are normal. There is no distension.     Palpations: Abdomen is soft. There is no mass.     Tenderness: There is no abdominal tenderness. There is no guarding or rebound.  Genitourinary:    Penis: Normal. No tenderness.      Testes: Normal.  Musculoskeletal:         General: No tenderness. Normal range of motion.     Cervical back: Neck supple.  Lymphadenopathy:     Cervical: No cervical adenopathy.  Skin:    General: Skin is warm and dry.     Coloration: Skin is not pale.     Findings: No erythema or rash.  Neurological:     General: No focal deficit present.     Mental Status: He is alert and oriented to person, place, and time.     Cranial Nerves: No cranial nerve deficit.     Motor: No abnormal muscle tone.     Coordination: Coordination normal.     Deep Tendon Reflexes: Reflexes are normal and symmetric. Reflexes normal.  Psychiatric:        Mood and Affect: Mood normal.        Behavior: Behavior normal.        Thought Content: Thought content normal.        Judgment: Judgment normal.           Assessment & Plan:  Well exam. We discussed diet and  exercise. Get fasting labs.  Garnette Olmsted, MD   "

## 2024-10-21 NOTE — Addendum Note (Signed)
 Addended by: LADONNA INOCENTE SAILOR on: 10/21/2024 11:30 AM   Modules accepted: Orders

## 2024-10-22 LAB — HEPATITIS C ANTIBODY: Hep C Virus Ab: NONREACTIVE

## 2024-10-23 ENCOUNTER — Ambulatory Visit: Payer: Self-pay | Admitting: Family Medicine
